# Patient Record
Sex: Female | Born: 1945
Health system: Southern US, Community
[De-identification: ages and names within clinical notes are randomized; demographics above are authoritative.]

## PROBLEM LIST (undated history)

## (undated) DIAGNOSIS — D071 Carcinoma in situ of vulva: Secondary | ICD-10-CM

## (undated) DIAGNOSIS — I4891 Unspecified atrial fibrillation: Secondary | ICD-10-CM

## (undated) DIAGNOSIS — E785 Hyperlipidemia, unspecified: Secondary | ICD-10-CM

## (undated) DIAGNOSIS — I2510 Atherosclerotic heart disease of native coronary artery without angina pectoris: Secondary | ICD-10-CM

## (undated) DIAGNOSIS — K5792 Diverticulitis of intestine, part unspecified, without perforation or abscess without bleeding: Secondary | ICD-10-CM

## (undated) DIAGNOSIS — E079 Disorder of thyroid, unspecified: Secondary | ICD-10-CM

## (undated) DIAGNOSIS — K589 Irritable bowel syndrome without diarrhea: Secondary | ICD-10-CM

## (undated) DIAGNOSIS — I341 Nonrheumatic mitral (valve) prolapse: Secondary | ICD-10-CM

## (undated) DIAGNOSIS — H269 Unspecified cataract: Secondary | ICD-10-CM

## (undated) HISTORY — DX: Unspecified atrial fibrillation: I48.91

## (undated) HISTORY — DX: Atherosclerotic heart disease of native coronary artery without angina pectoris: I25.10

## (undated) HISTORY — DX: Disorder of thyroid, unspecified: E07.9

## (undated) HISTORY — DX: Unspecified cataract: H26.9

## (undated) HISTORY — DX: Nonrheumatic mitral (valve) prolapse: I34.1

## (undated) HISTORY — PX: CATARACT EXTRACTION: SUR2

## (undated) HISTORY — DX: Diverticulitis of intestine, part unspecified, without perforation or abscess without bleeding: K57.92

## (undated) HISTORY — DX: Carcinoma in situ of vulva: D07.1

## (undated) HISTORY — DX: Irritable bowel syndrome, unspecified: K58.9

## (undated) HISTORY — DX: Hyperlipidemia, unspecified: E78.5

---

## 1976-10-22 HISTORY — PX: ABDOMINAL HYSTERECTOMY: SHX81

## 1980-10-22 HISTORY — PX: OTHER SURGICAL HISTORY: SHX169

## 1993-09-21 HISTORY — PX: OTHER SURGICAL HISTORY: SHX169

## 1998-02-28 ENCOUNTER — Other Ambulatory Visit: Admission: RE | Admit: 1998-02-28 | Discharge: 1998-02-28 | Payer: Self-pay | Admitting: Dermatology

## 1998-08-05 ENCOUNTER — Ambulatory Visit (HOSPITAL_COMMUNITY): Admission: RE | Admit: 1998-08-05 | Discharge: 1998-08-05 | Payer: Self-pay | Admitting: *Deleted

## 1999-08-07 ENCOUNTER — Ambulatory Visit (HOSPITAL_COMMUNITY): Admission: RE | Admit: 1999-08-07 | Discharge: 1999-08-07 | Payer: Self-pay | Admitting: *Deleted

## 1999-08-07 ENCOUNTER — Encounter: Payer: Self-pay | Admitting: *Deleted

## 2000-02-12 ENCOUNTER — Other Ambulatory Visit: Admission: RE | Admit: 2000-02-12 | Discharge: 2000-02-12 | Payer: Self-pay | Admitting: *Deleted

## 2000-08-09 ENCOUNTER — Ambulatory Visit (HOSPITAL_COMMUNITY): Admission: RE | Admit: 2000-08-09 | Discharge: 2000-08-09 | Payer: Self-pay | Admitting: *Deleted

## 2000-08-09 ENCOUNTER — Encounter: Payer: Self-pay | Admitting: *Deleted

## 2001-08-15 ENCOUNTER — Ambulatory Visit (HOSPITAL_COMMUNITY): Admission: RE | Admit: 2001-08-15 | Discharge: 2001-08-15 | Payer: Self-pay | Admitting: *Deleted

## 2001-08-15 ENCOUNTER — Encounter: Payer: Self-pay | Admitting: *Deleted

## 2002-08-17 ENCOUNTER — Encounter: Payer: Self-pay | Admitting: Family Medicine

## 2002-08-17 ENCOUNTER — Ambulatory Visit (HOSPITAL_COMMUNITY): Admission: RE | Admit: 2002-08-17 | Discharge: 2002-08-17 | Payer: Self-pay | Admitting: Family Medicine

## 2003-08-19 ENCOUNTER — Ambulatory Visit (HOSPITAL_COMMUNITY): Admission: RE | Admit: 2003-08-19 | Discharge: 2003-08-19 | Payer: Self-pay | Admitting: Family Medicine

## 2004-09-04 ENCOUNTER — Ambulatory Visit (HOSPITAL_COMMUNITY): Admission: RE | Admit: 2004-09-04 | Discharge: 2004-09-04 | Payer: Self-pay | Admitting: Family Medicine

## 2005-09-21 ENCOUNTER — Ambulatory Visit (HOSPITAL_COMMUNITY): Admission: RE | Admit: 2005-09-21 | Discharge: 2005-09-21 | Payer: Self-pay | Admitting: Family Medicine

## 2006-01-03 ENCOUNTER — Other Ambulatory Visit: Admission: RE | Admit: 2006-01-03 | Discharge: 2006-01-03 | Payer: Self-pay | Admitting: Gynecology

## 2006-04-30 ENCOUNTER — Other Ambulatory Visit: Admission: RE | Admit: 2006-04-30 | Discharge: 2006-04-30 | Payer: Self-pay | Admitting: Gynecology

## 2006-07-22 ENCOUNTER — Other Ambulatory Visit: Admission: RE | Admit: 2006-07-22 | Discharge: 2006-07-22 | Payer: Self-pay | Admitting: Gynecology

## 2006-10-01 ENCOUNTER — Ambulatory Visit (HOSPITAL_COMMUNITY): Admission: RE | Admit: 2006-10-01 | Discharge: 2006-10-01 | Payer: Self-pay | Admitting: Family Medicine

## 2006-12-21 HISTORY — PX: OTHER SURGICAL HISTORY: SHX169

## 2006-12-25 ENCOUNTER — Other Ambulatory Visit: Admission: RE | Admit: 2006-12-25 | Discharge: 2006-12-25 | Payer: Self-pay | Admitting: Gynecology

## 2007-01-07 ENCOUNTER — Ambulatory Visit (HOSPITAL_BASED_OUTPATIENT_CLINIC_OR_DEPARTMENT_OTHER): Admission: RE | Admit: 2007-01-07 | Discharge: 2007-01-07 | Payer: Self-pay | Admitting: Gynecology

## 2007-10-07 ENCOUNTER — Ambulatory Visit (HOSPITAL_COMMUNITY): Admission: RE | Admit: 2007-10-07 | Discharge: 2007-10-07 | Payer: Self-pay | Admitting: Family Medicine

## 2008-10-07 ENCOUNTER — Ambulatory Visit (HOSPITAL_COMMUNITY): Admission: RE | Admit: 2008-10-07 | Discharge: 2008-10-07 | Payer: Self-pay | Admitting: Family Medicine

## 2008-10-29 ENCOUNTER — Encounter: Admission: RE | Admit: 2008-10-29 | Discharge: 2008-10-29 | Payer: Self-pay | Admitting: Family Medicine

## 2009-03-03 ENCOUNTER — Ambulatory Visit (HOSPITAL_BASED_OUTPATIENT_CLINIC_OR_DEPARTMENT_OTHER): Admission: RE | Admit: 2009-03-03 | Discharge: 2009-03-03 | Payer: Self-pay | Admitting: Gynecology

## 2009-03-03 HISTORY — PX: OTHER SURGICAL HISTORY: SHX169

## 2009-11-21 ENCOUNTER — Encounter: Admission: RE | Admit: 2009-11-21 | Discharge: 2009-11-21 | Payer: Self-pay | Admitting: Gynecology

## 2010-09-18 ENCOUNTER — Other Ambulatory Visit
Admission: RE | Admit: 2010-09-18 | Discharge: 2010-09-18 | Payer: Self-pay | Source: Home / Self Care | Admitting: Family Medicine

## 2010-11-12 ENCOUNTER — Encounter: Payer: Self-pay | Admitting: Gynecology

## 2010-11-12 ENCOUNTER — Encounter: Payer: Self-pay | Admitting: Family Medicine

## 2010-11-27 ENCOUNTER — Other Ambulatory Visit (HOSPITAL_COMMUNITY): Payer: Self-pay | Admitting: Surgical Oncology

## 2010-11-27 ENCOUNTER — Other Ambulatory Visit: Payer: Self-pay | Admitting: Family Medicine

## 2010-11-27 DIAGNOSIS — Z1231 Encounter for screening mammogram for malignant neoplasm of breast: Secondary | ICD-10-CM

## 2010-12-01 ENCOUNTER — Ambulatory Visit: Payer: Self-pay

## 2010-12-07 ENCOUNTER — Ambulatory Visit
Admission: RE | Admit: 2010-12-07 | Discharge: 2010-12-07 | Disposition: A | Discharge status: Home / Self Care | Payer: BC Managed Care – PPO | Source: Ambulatory Visit | Attending: Family Medicine | Admitting: Family Medicine

## 2010-12-07 DIAGNOSIS — Z1231 Encounter for screening mammogram for malignant neoplasm of breast: Secondary | ICD-10-CM

## 2011-01-30 LAB — URINALYSIS, MICROSCOPIC ONLY
Glucose, UA: NEGATIVE mg/dL
Specific Gravity, Urine: 1.015 (ref 1.005–1.030)
Urobilinogen, UA: 0.2 mg/dL (ref 0.0–1.0)
pH: 5.5 (ref 5.0–8.0)

## 2011-01-30 LAB — POCT HEMOGLOBIN-HEMACUE: Hemoglobin: 12 g/dL (ref 12.0–15.0)

## 2011-01-30 LAB — HEMOGLOBIN AND HEMATOCRIT, BLOOD: Hemoglobin: 11.1 g/dL — ABNORMAL LOW (ref 12.0–15.0)

## 2011-03-06 NOTE — Op Note (Signed)
Susan Bray, Susan Bray                 ACCOUNT NO.:  [id number]   MEDICAL RECORD NO.:  [id number]          PATIENT TYPE:  AMB   LOCATION:  [facility]                         FACILITY:  [facility]   PHYSICIAN:  Gretta Cool, M.D. DATE OF BIRTH:  05/06/1946   DATE OF PROCEDURE:  03/03/2009  DATE OF DISCHARGE:                               OPERATIVE REPORT   PREOPERATIVE DIAGNOSES:  Pelvic organ prolapse with grade 3 rectocele,  enterocele and grade 2 vault prolapse.   SURGEON:  Gretta Cool, M.D.   ASSISTANT:  Luvenia Redden, M.D.   ANESTHESIA:  General orotracheal.   DESCRIPTION OF PROCEDURE:  With the patient prepped and draped in  lithotomy position in Allen stirrups with Foley catheter draining her  bladder, an incision was made in the posterior vaginal wall and the  mucosa undermined to the apex of the vagina.  Xylocaine 1% with  epinephrine was used to help control blood loss.  The mucosa was then  dissected from the perirectal fascia to the apex of the vaginal cuff.  The cardinal uterosacral ligament complex was then identified  posteriorly and removed of adventitial tissue and fat.  The ligament was  then secured with 2-0 Novofil to the perirectal fascia from which it had  detached.  The reattachment was accomplished without difficulty with  fascia to fascia attachment.  At this point, the central portion of the  ends of the uterosacral cardinal complex were plicated in the midline to  the posterior aspect of the cuff.  The central portion of the detached  fascia was then secured to that cuff and to that uterosacral complex.  At this point, the posterior vaginal fascia was plicated in the midline  from the apex to the introitus with a running suture of #0 Vicryl.  The  mucosa was then trimmed and upper layers of perirectal fascia and mucosa  closed as a subcuticular closure of 2-0 Vicryl.  At this point, the skin  was closed with subcuticular closure.  The procedure was  terminated  without complication.  The patient returned to the recovery room in  excellent condition.           ______________________________  Gretta Cool, M.D.     CWL/MEDQ  D:  03/03/2009  T:  03/03/2009  Job:  [id number]   cc:   Luvenia Redden, M.D.  Fax: [phone]   Windle Guard, M.D.  Fax: [phone]

## 2011-03-09 NOTE — Op Note (Signed)
NAMEGREGORIA, SELVY                 ACCOUNT NO.:  [id number]   MEDICAL RECORD NO.:  [id number]          PATIENT TYPE:  AMB   LOCATION:  [facility]                         FACILITY:  [facility]   PHYSICIAN:  Gretta Cool, M.D. DATE OF BIRTH:  [birth year]   DATE OF PROCEDURE:  DATE OF DISCHARGE:                               OPERATIVE REPORT   PREOPERATIVE DIAGNOSIS:  High grade vulvar and vaginal condyloma.  It  was persistent after 18 months of therapy.   POSTOPERATIVE DIAGNOSIS:  High grade vulvar and vaginal condyloma.  It  was persistent after 18 months of therapy.   PROCEDURE:  Laser vaporization of residual high grade vaginal dysplasia,  vulvar intraepithelial neoplasia (VIN) II.   SURGEON:  Gretta Cool, M.D.   ANESTHESIA:  General LMA.   DESCRIPTION OF PROCEDURE:  Under satisfactory anesthesia, as above, with  patient prepped and draped in Allen stirrups, the vagina was examined  with acetic acid then with Lugol, and all of the remaining nonstained  areas with Lugol were identified and vaporized by laser vaporization.  There were three or four individual areas in the left apex fund of the  vagina and one area approximately 1 cm in the right vaginal apex.  There  were no other lesions on the anterior or posterior vaginal walls all the  way to the introitus.  After complete laser vaporization of the lesions,  the procedure was terminated without complication.  Patient returned to  the recovery room in excellent condition.           ______________________________  Gretta Cool, M.D.     CWL/MEDQ  D:  01/07/2007  T:  01/07/2007  Job:  [id number]

## 2011-04-09 ENCOUNTER — Other Ambulatory Visit: Payer: Self-pay | Admitting: Gynecology

## 2011-11-22 ENCOUNTER — Other Ambulatory Visit: Payer: Self-pay | Admitting: Family Medicine

## 2011-11-22 DIAGNOSIS — Z1231 Encounter for screening mammogram for malignant neoplasm of breast: Secondary | ICD-10-CM

## 2011-12-13 ENCOUNTER — Ambulatory Visit
Admission: RE | Admit: 2011-12-13 | Discharge: 2011-12-13 | Disposition: A | Discharge status: Home / Self Care | Payer: BC Managed Care – PPO | Source: Ambulatory Visit | Attending: Family Medicine | Admitting: Family Medicine

## 2011-12-13 DIAGNOSIS — Z1231 Encounter for screening mammogram for malignant neoplasm of breast: Secondary | ICD-10-CM

## 2012-04-10 ENCOUNTER — Other Ambulatory Visit: Payer: Self-pay | Admitting: Gynecology

## 2012-12-03 ENCOUNTER — Other Ambulatory Visit: Payer: Self-pay | Admitting: Family Medicine

## 2012-12-03 DIAGNOSIS — Z1231 Encounter for screening mammogram for malignant neoplasm of breast: Secondary | ICD-10-CM

## 2012-12-15 ENCOUNTER — Other Ambulatory Visit: Payer: Self-pay | Admitting: Obstetrics and Gynecology

## 2012-12-15 ENCOUNTER — Other Ambulatory Visit (HOSPITAL_COMMUNITY)
Admission: RE | Admit: 2012-12-15 | Discharge: 2012-12-15 | Disposition: A | Discharge status: Home / Self Care | Payer: Medicare Other | Source: Ambulatory Visit | Attending: Obstetrics and Gynecology | Admitting: Obstetrics and Gynecology

## 2012-12-15 DIAGNOSIS — Z01419 Encounter for gynecological examination (general) (routine) without abnormal findings: Secondary | ICD-10-CM | POA: Insufficient documentation

## 2012-12-15 DIAGNOSIS — R87810 Cervical high risk human papillomavirus (HPV) DNA test positive: Secondary | ICD-10-CM | POA: Insufficient documentation

## 2012-12-15 DIAGNOSIS — Z1151 Encounter for screening for human papillomavirus (HPV): Secondary | ICD-10-CM | POA: Insufficient documentation

## 2012-12-31 ENCOUNTER — Ambulatory Visit
Admission: RE | Admit: 2012-12-31 | Discharge: 2012-12-31 | Disposition: A | Discharge status: Home / Self Care | Payer: Medicare Other | Source: Ambulatory Visit | Attending: Family Medicine | Admitting: Family Medicine

## 2013-02-09 ENCOUNTER — Other Ambulatory Visit: Payer: Self-pay | Admitting: Obstetrics and Gynecology

## 2013-03-18 HISTORY — PX: OTHER SURGICAL HISTORY: SHX169

## 2013-07-22 ENCOUNTER — Ambulatory Visit
Admission: RE | Admit: 2013-07-22 | Discharge: 2013-07-22 | Disposition: A | Discharge status: Home / Self Care | Payer: Medicare Other | Source: Ambulatory Visit | Attending: Family Medicine | Admitting: Family Medicine

## 2013-07-22 ENCOUNTER — Other Ambulatory Visit: Payer: Self-pay | Admitting: Family Medicine

## 2013-07-22 DIAGNOSIS — R1011 Right upper quadrant pain: Secondary | ICD-10-CM

## 2013-10-16 ENCOUNTER — Ambulatory Visit: Payer: Medicare Other | Admitting: Family Medicine

## 2013-11-23 ENCOUNTER — Other Ambulatory Visit (HOSPITAL_COMMUNITY)
Admission: RE | Admit: 2013-11-23 | Discharge: 2013-11-23 | Disposition: A | Discharge status: Home / Self Care | Payer: Medicare HMO | Source: Ambulatory Visit | Attending: Obstetrics and Gynecology | Admitting: Obstetrics and Gynecology

## 2013-11-23 ENCOUNTER — Other Ambulatory Visit: Payer: Self-pay | Admitting: Obstetrics and Gynecology

## 2013-11-23 DIAGNOSIS — Z1151 Encounter for screening for human papillomavirus (HPV): Secondary | ICD-10-CM | POA: Insufficient documentation

## 2013-11-23 DIAGNOSIS — R87810 Cervical high risk human papillomavirus (HPV) DNA test positive: Secondary | ICD-10-CM | POA: Insufficient documentation

## 2013-11-23 DIAGNOSIS — Z01419 Encounter for gynecological examination (general) (routine) without abnormal findings: Secondary | ICD-10-CM | POA: Insufficient documentation

## 2013-11-23 DIAGNOSIS — Z124 Encounter for screening for malignant neoplasm of cervix: Secondary | ICD-10-CM | POA: Insufficient documentation

## 2013-12-02 MED ORDER — MORPHINE SULFATE (PF) 1 MG/ML IV SOLN
INTRAVENOUS | Status: AC
  Filled 2013-12-02: qty 25

## 2013-12-29 ENCOUNTER — Other Ambulatory Visit: Payer: Self-pay

## 2013-12-29 DIAGNOSIS — Z1231 Encounter for screening mammogram for malignant neoplasm of breast: Secondary | ICD-10-CM

## 2014-01-01 ENCOUNTER — Other Ambulatory Visit (INDEPENDENT_AMBULATORY_CARE_PROVIDER_SITE_OTHER): Payer: Medicare HMO

## 2014-01-01 ENCOUNTER — Ambulatory Visit (INDEPENDENT_AMBULATORY_CARE_PROVIDER_SITE_OTHER): Payer: Medicare HMO | Admitting: Physician Assistant

## 2014-01-01 ENCOUNTER — Encounter: Payer: Self-pay | Admitting: Physician Assistant

## 2014-01-01 VITALS — BP 100/60 | HR 69 | Temp 97.7°F | Ht 64.0 in | Wt 134.2 lb

## 2014-01-01 DIAGNOSIS — R42 Dizziness and giddiness: Secondary | ICD-10-CM

## 2014-01-01 DIAGNOSIS — J029 Acute pharyngitis, unspecified: Secondary | ICD-10-CM

## 2014-01-01 DIAGNOSIS — E785 Hyperlipidemia, unspecified: Secondary | ICD-10-CM

## 2014-01-01 LAB — CBC WITH DIFFERENTIAL/PLATELET
Basophils Absolute: 0 K/uL (ref 0.0–0.1)
Basophils Relative: 0.6 % (ref 0.0–3.0)
Eosinophils Absolute: 0.3 K/uL (ref 0.0–0.7)
Eosinophils Relative: 4.9 % (ref 0.0–5.0)
HCT: 36.3 % (ref 36.0–46.0)
Hemoglobin: 12 g/dL (ref 12.0–15.0)
Lymphocytes Relative: 32.2 % (ref 12.0–46.0)
Lymphs Abs: 1.8 K/uL (ref 0.7–4.0)
MCHC: 32.9 g/dL (ref 30.0–36.0)
MCV: 86.1 fl (ref 78.0–100.0)
Monocytes Absolute: 0.4 K/uL (ref 0.1–1.0)
Monocytes Relative: 7.9 % (ref 3.0–12.0)
Neutro Abs: 3.1 K/uL (ref 1.4–7.7)
Neutrophils Relative %: 54.4 % (ref 43.0–77.0)
Platelets: 186 K/uL (ref 150.0–400.0)
RBC: 4.22 Mil/uL (ref 3.87–5.11)
RDW: 13.9 % (ref 11.5–14.6)
WBC: 5.7 K/uL (ref 4.5–10.5)

## 2014-01-01 LAB — MONONUCLEOSIS SCREEN: MONO SCREEN: NEGATIVE

## 2014-01-01 MED ORDER — AMOXICILLIN 875 MG PO TABS: 875.0000 mg | ORAL_TABLET | Freq: Two times a day (BID) | ORAL | Status: DC

## 2014-01-01 NOTE — Patient Instructions (Signed)
It was great to meet you today [name]!  I have ordered a few labs today, please report to the lab in the basement.     Vertigo Vertigo means you feel like you are moving when you are not. Vertigo can make you feel like things around you are moving when they are not. This problem often goes away on its own.  HOME CARE   Follow your doctor's instructions.  Avoid driving.  Avoid using heavy machinery.  Avoid doing any activity that could be dangerous if you have a vertigo attack.  Tell your doctor if a medicine seems to cause your vertigo. GET HELP RIGHT AWAY IF:   Your medicines do not help or make you feel worse.  You have trouble talking or walking.  You feel weak or have trouble using your arms, hands, or legs.  You have bad headaches.  You keep feeling sick to your stomach (nauseous) or throwing up (vomiting).  Your vision changes.  A family member notices changes in your behavior.  Your problems get worse. MAKE SURE YOU:  Understand these instructions.  Will watch your condition.  Will get help right away if you are not doing well or get worse. Document Released: 07/17/2008 Document Revised: 12/31/2011 Document Reviewed: 04/26/2011 [facility] Patient Information 2014 [employer]. Strep Throat Strep throat is an infection of the throat caused by a bacteria named Streptococcus pyogenes. Your caregiver may call the infection streptococcal "tonsillitis" or "pharyngitis" depending on whether there are signs of inflammation in the tonsils or back of the throat. Strep throat is most common in children aged 5 15 years during the cold months of the year, but it can occur in people of any age during any season. This infection is spread from person to person (contagious) through coughing, sneezing, or other close contact. SYMPTOMS   Fever or chills.  Painful, swollen, red tonsils or throat.  Pain or difficulty when swallowing.  White or yellow spots on the tonsils  or throat.  Swollen, tender lymph nodes or "glands" of the neck or under the jaw.  Red rash all over the body (rare). DIAGNOSIS  Many different infections can cause the same symptoms. A test must be done to confirm the diagnosis so the right treatment can be given. A "rapid strep test" can help your caregiver make the diagnosis in a few minutes. If this test is not available, a light swab of the infected area can be used for a throat culture test. If a throat culture test is done, results are usually available in a day or two. TREATMENT  Strep throat is treated with antibiotic medicine. HOME CARE INSTRUCTIONS   Gargle with 1 tsp of salt in 1 cup of warm water, 3 4 times per day or as needed for comfort.  Family members who also have a sore throat or fever should be tested for strep throat and treated with antibiotics if they have the strep infection.  Make sure everyone in your household washes their hands well.  Do not share food, drinking cups, or personal items that could cause the infection to spread to others.  You may need to eat a soft food diet until your sore throat gets better.  Drink enough water and fluids to keep your urine clear or pale yellow. This will help prevent dehydration.  Get plenty of rest.  Stay home from school, daycare, or work until you have been on antibiotics for 24 hours.  Only take over-the-counter or prescription medicines  for pain, discomfort, or fever as directed by your caregiver.  If antibiotics are prescribed, take them as directed. Finish them even if you start to feel better. SEEK MEDICAL CARE IF:   The glands in your neck continue to enlarge.  You develop a rash, cough, or earache.  You cough up green, yellow-brown, or bloody sputum.  You have pain or discomfort not controlled by medicines.  Your problems seem to be getting worse rather than better. SEEK IMMEDIATE MEDICAL CARE IF:   You develop any new symptoms such as vomiting,  severe headache, stiff or painful neck, chest pain, shortness of breath, or trouble swallowing.  You develop severe throat pain, drooling, or changes in your voice.  You develop swelling of the neck, or the skin on the neck becomes red and tender.  You have a fever.  You develop signs of dehydration, such as fatigue, dry mouth, and decreased urination.  You become increasingly sleepy, or you cannot wake up completely. Document Released: 10/05/2000 Document Revised: 09/24/2012 Document Reviewed: 12/07/2010 [facility] Patient Information 2014 [place], [place].

## 2014-01-01 NOTE — Progress Notes (Signed)
Pre visit review using our clinic review tool, if applicable. No additional management support is needed unless otherwise documented below in the visit note. 

## 2014-01-02 ENCOUNTER — Encounter: Payer: Self-pay | Admitting: Physician Assistant

## 2014-01-02 NOTE — Progress Notes (Signed)
Subjective:    Patient ID: Susan Bray, female    DOB: 09-12-1946, 68 y.o.   MRN: [id number]  HPI Comments: Patient is a 68 year old female who presents to the office to establish care. Reports she has had regular primary care with a different provider in the area. Had complete physical exam and lab work with provider in January, then to find out they are not part of her insurance. Will obtain past records from them to include lab results, immunizations and diagnostics.  Current complaint: Sore throat starting 2-3 days PTA, Painful swallowing, White spots on back of throat that are painful. No known fever   History of intermittent vertigo treated with meclizine as needed. History of hyperlipidemia treated with Crestor  Is established with GYN, mammogram scheduled for later in March. Eye Dr: in last 68months   Review of Systems  Constitutional: Negative for chills, activity change, appetite change and fatigue.  HENT: Positive for sore throat. Negative for congestion, ear pain, postnasal drip, rhinorrhea, sinus pressure, trouble swallowing (painful to swallow) and voice change.   Eyes: Negative for pain and visual disturbance.  Respiratory: Negative for cough, shortness of breath and wheezing.   Cardiovascular: Negative for chest pain and palpitations.  Gastrointestinal: Negative for nausea, vomiting, diarrhea and constipation.  Musculoskeletal: Negative for myalgias.  Skin: Negative for rash.  Neurological: Negative for dizziness, weakness, light-headedness and headaches.   Past Medical History  Diagnosis Date  . Hyperlipidemia    Family History  Problem Relation Age of Onset  . Arthritis Mother   . Cancer Mother     breast  . Heart disease Mother   . Hypertension Mother   . Heart disease Father    History   Social History  . Marital Status: Married    Spouse Name: N/A    Number of Children: N/A  . Years of Education: N/A   Social History Main Topics  . Smoking  status: Never Smoker   . Smokeless tobacco: None  . Alcohol Use: No  . Drug Use: No  . Sexual Activity: None   Other Topics Concern  . None   Social History Narrative   Married   Retired   3 pregnancies   3 live births   8-9 hours of sleep a night    2 people in residence    Past Surgical History  Procedure Laterality Date  . Abdominal hysterectomy  1978  . Tummy tuck  1982  . Upper colpectomy  09/1993  . Cataract extraction  08/17/04 ; 09/12/2004    left eye and right eye  . Laser vaporization of hg vaginal dysplasia  12/2006  . P & e repairs with cardinal uterosatral coloposusension (pelvic area repair)  03/03/09  . Severe dysplasia near rectum  03/18/2013   No Known Allergies No current outpatient prescriptions on file prior to visit.   No current facility-administered medications on file prior to visit.        Objective:   Physical Exam  Vitals reviewed. Constitutional: She appears well-developed and well-nourished. No distress.  HENT:  Head: Normocephalic and atraumatic.  Right Ear: Tympanic membrane, external ear and ear canal normal.  Left Ear: Tympanic membrane, external ear and ear canal normal.  Nose: No mucosal edema or rhinorrhea. Right sinus exhibits no maxillary sinus tenderness and no frontal sinus tenderness. Left sinus exhibits no maxillary sinus tenderness and no frontal sinus tenderness.  Mouth/Throat: Uvula is midline and mucous membranes are normal. No trismus in the  jaw. No uvula swelling. Oropharyngeal exudate, posterior oropharyngeal edema (minimal) and posterior oropharyngeal erythema (mild) present. No tonsillar abscesses.  Clear buccal mucosa, floor of mouth is soft, no submental or submandibular lymphadenopathy  Cardiovascular: Normal rate and regular rhythm.  Exam reveals no gallop and no friction rub.   No murmur heard. Pulmonary/Chest: Effort normal and breath sounds normal. No accessory muscle usage. No respiratory distress. She has no  wheezes. She has no rhonchi. She has no rales.  Abdominal: Soft. Normal appearance and bowel sounds are normal. There is no tenderness.  Lymphadenopathy:       Right: No supraclavicular adenopathy present.       Left: No supraclavicular adenopathy present.  Mild anterior cervical lymphadenopathy mildly TTP.  Skin: Skin is warm and dry.  Psychiatric: She has a normal mood and affect.   Filed Vitals:   01/01/14 1043  BP: 100/60  Pulse: 69  Temp: 97.7 F (36.5 C)      Assessment & Plan:   Exudative pharyngitis Rx for antibiotics Labs ordered for CBC and monospot RTO if no improvement of symptoms or symptoms become worse  Hyperlipidemia Continue on current medications.  RTO in 6 months for evaluation

## 2014-01-05 ENCOUNTER — Other Ambulatory Visit: Payer: Medicare HMO

## 2014-01-11 ENCOUNTER — Other Ambulatory Visit: Payer: Self-pay | Admitting: Physician Assistant

## 2014-01-11 ENCOUNTER — Encounter: Payer: Self-pay | Admitting: Physician Assistant

## 2014-01-11 MED ORDER — FLUCONAZOLE 150 MG PO TABS: 150.0000 mg | ORAL_TABLET | Freq: Once | ORAL | Status: DC

## 2014-01-20 ENCOUNTER — Encounter: Payer: Self-pay | Admitting: Physician Assistant

## 2014-01-20 ENCOUNTER — Ambulatory Visit
Admission: RE | Admit: 2014-01-20 | Discharge: 2014-01-20 | Disposition: A | Discharge status: Home / Self Care | Payer: Medicare HMO | Source: Ambulatory Visit

## 2014-01-20 DIAGNOSIS — Z1231 Encounter for screening mammogram for malignant neoplasm of breast: Secondary | ICD-10-CM

## 2014-01-20 DIAGNOSIS — J329 Chronic sinusitis, unspecified: Secondary | ICD-10-CM

## 2014-01-25 NOTE — Addendum Note (Signed)
Addended by: Gwendolyn Grant A on: 01/25/2014 06:52 PM   Modules accepted: Orders

## 2014-02-05 ENCOUNTER — Encounter: Payer: Self-pay | Admitting: Physician Assistant

## 2014-05-31 ENCOUNTER — Encounter: Payer: Self-pay | Admitting: Internal Medicine

## 2014-05-31 ENCOUNTER — Ambulatory Visit (INDEPENDENT_AMBULATORY_CARE_PROVIDER_SITE_OTHER): Payer: Medicare HMO | Admitting: Internal Medicine

## 2014-05-31 VITALS — BP 109/66 | HR 65 | Temp 98.2°F | Wt 127.2 lb

## 2014-05-31 DIAGNOSIS — J029 Acute pharyngitis, unspecified: Secondary | ICD-10-CM

## 2014-05-31 NOTE — Progress Notes (Signed)
   Subjective:    Patient ID: Susan Bray, female    DOB: 1946/09/11, 68 y.o.   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  On 8/6 she developed  scratchy, sore throat and rhinitis. She also had some chills  As of 8/8 she had noted some blisters in the back of her pharynx.  She has some frontal sinus pressure discomfort and earache.    Review of Systems Facial pain , nasal purulence, dental pain,  or otic discharge denied. No fever or sweats.  She has no significant extrinsic symptoms.  Cough is nonproductive.     Objective:   Physical Exam  Positive for significant findings include: Mild erythema of the posterior pharynx with a single small vesicle. No exudate present. Thyroid asymmetry, left lobe greater than right.  General appearance:good health ;well nourished; no acute distress or increased work of breathing is present.  No  lymphadenopathy about the head, neck, or axilla noted.   Eyes: No conjunctival inflammation or lid edema is present. There is no scleral icterus.  Ears:  External ear exam shows no significant lesions or deformities.  Otoscopic examination reveals clear canals, tympanic membranes are intact bilaterally without bulging, retraction, inflammation or discharge.  Nose:  External nasal examination shows no deformity or inflammation. Nasal mucosa are pink and moist without lesions or exudates. No septal dislocation or deviation.No obstruction to airflow.   Oral exam: Dental hygiene is good; lips and gums are healthy appearing.  Neck:  No deformities, masses, or tenderness noted.   Supple with full range of motion without pain.   Heart:  Normal rate and regular rhythm. S1 and S2 normal without gallop, murmur, click, rub or other extra sounds.   Lungs:Chest clear to auscultation; no wheezes, rhonchi,rales ,or rubs present.No increased work of breathing.    Extremities:  No cyanosis,  edema, or clubbing  noted    Skin: Warm & dry w/o jaundice or tenting.         Assessment & Plan:  #1 viral pharyngitis  See orders and recommendations

## 2014-05-31 NOTE — Progress Notes (Signed)
Pre visit review using our clinic review tool, if applicable. No additional management support is needed unless otherwise documented below in the visit note. 

## 2014-05-31 NOTE — Patient Instructions (Addendum)
Plain Mucinex (NOT D) for thick secretions ;force NON dairy fluids .   Nasal cleansing in the shower as discussed with lather of mild shampoo.After 10 seconds wash off lather while  exhaling through nostrils. Make sure that all residual soap is removed to prevent irritation.  Flonase OR Nasacort AQ 1 spray in each nostril twice a day as needed. Use the "crossover" technique into opposite nostril spraying toward opposite ear @ 45 degree angle, not straight up into nostril.  Use a Neti pot daily only  as needed for significant sinus congestion; going from open side to congested side . Plain Allegra (NOT D )  160 daily , Loratidine 10 mg , OR Zyrtec 10 mg @ bedtime  as needed for itchy eyes & sneezing.  Zicam Melts or Zinc lozenges as per package label for scratchy throat . Complementary options include  vitamin C 2000 mg daily; & Echinacea for 4-7 days. Report persistent or progressive fever; discolored nasal or chest secretions; or frontal headache or facial  pain.

## 2014-07-29 ENCOUNTER — Other Ambulatory Visit (INDEPENDENT_AMBULATORY_CARE_PROVIDER_SITE_OTHER): Payer: Medicare HMO

## 2014-07-29 ENCOUNTER — Ambulatory Visit (INDEPENDENT_AMBULATORY_CARE_PROVIDER_SITE_OTHER): Payer: Medicare HMO | Admitting: Internal Medicine

## 2014-07-29 ENCOUNTER — Encounter: Payer: Self-pay | Admitting: Internal Medicine

## 2014-07-29 VITALS — BP 118/70 | HR 70 | Temp 98.2°F | Resp 20 | Ht 64.0 in | Wt 124.8 lb

## 2014-07-29 DIAGNOSIS — R5383 Other fatigue: Secondary | ICD-10-CM

## 2014-07-29 DIAGNOSIS — J029 Acute pharyngitis, unspecified: Secondary | ICD-10-CM

## 2014-07-29 LAB — CBC
HEMATOCRIT: 38.7 % (ref 36.0–46.0)
Hemoglobin: 12.8 g/dL (ref 12.0–15.0)
MCHC: 33.1 g/dL (ref 30.0–36.0)
MCV: 85.3 fl (ref 78.0–100.0)
Platelets: 200 10*3/uL (ref 150.0–400.0)
RBC: 4.53 Mil/uL (ref 3.87–5.11)
RDW: 13.5 % (ref 11.5–15.5)
WBC: 6 10*3/uL (ref 4.0–10.5)

## 2014-07-29 MED ORDER — AMOXICILLIN 875 MG PO TABS: 875.0000 mg | ORAL_TABLET | Freq: Two times a day (BID) | ORAL | Status: DC

## 2014-07-29 NOTE — Patient Instructions (Signed)
We will treat you with amoxicillin for the sore throat. Take 1 pill twice a day until it is gone.   We will check your blood tests and call you with the results.

## 2014-07-29 NOTE — Progress Notes (Signed)
Pre visit review using our clinic review tool, if applicable. No additional management support is needed unless otherwise documented below in the visit note. 

## 2014-07-30 DIAGNOSIS — J029 Acute pharyngitis, unspecified: Secondary | ICD-10-CM | POA: Insufficient documentation

## 2014-07-30 LAB — HIV ANTIBODY (ROUTINE TESTING W REFLEX): HIV: NONREACTIVE

## 2014-07-30 NOTE — Progress Notes (Signed)
   Subjective:    Patient ID: Susan Bray, female    DOB: 05-22-1946, 68 y.o.   MRN: [id number]  HPI The patient is a 68 show female with no significant past medical history and no chronic medications. She does come in today for sore throat and white spots on the top of her mouth. She noticed a sore throat about a week and half ago. She denies drainage, congestion, fevers, chills. She initially started gargling with warm salt water to help as this remedy was given to her earlier in the year for similar situation. She noticed it was not going away and was getting worse so she decided to come in to be seen. Denies other skin change or rash.  Review of Systems  Constitutional: Negative for fever, chills, activity change, appetite change and fatigue.  HENT: Positive for sore throat. Negative for congestion, ear discharge, nosebleeds, postnasal drip, rhinorrhea, sinus pressure and sneezing.   Respiratory: Negative for cough, chest tightness, shortness of breath and wheezing.   Cardiovascular: Negative for chest pain, palpitations and leg swelling.  Gastrointestinal: Negative for abdominal pain, diarrhea, constipation and abdominal distention.      Objective:   Physical Exam  Constitutional: She appears well-developed and well-nourished. No distress.  HENT:  Head: Normocephalic and atraumatic.  Right Ear: External ear normal.  Left Ear: External ear normal.  Mouth with several white spots on the roof of the mouth which are removable with tongue depressor. Tongue without plaque. Some swelling of the tonsils at the back of the throat.  Eyes: EOM are normal.  Neck: Normal range of motion. No JVD present.  Cardiovascular: Normal rate and regular rhythm.   Pulmonary/Chest: Effort normal and breath sounds normal. No respiratory distress. She has no wheezes. She has no rales.  Abdominal: Soft.  Lymphadenopathy:    She has no cervical adenopathy.  Neurological: She is alert.  Skin: Skin is warm and  dry.   Filed Vitals:   07/29/14 1338  BP: 118/70  Pulse: 70  Temp: 98.2 F (36.8 C)  TempSrc: Oral  Resp: 20  Height: 5\' 4"  (1.626 m)  Weight: 124 lb 12.8 oz (56.609 kg)  SpO2: 97%      Assessment & Plan:

## 2014-07-30 NOTE — Assessment & Plan Note (Signed)
Several white spots on the roof of the mouth. Some Prelone to the back of the throat and will treat for strep throat with amoxicillin 500 mg 3 times a day for 10 days. We'll also check CBC, HIV test. Previously she has been tested for mono which was negative.

## 2014-08-25 ENCOUNTER — Ambulatory Visit: Payer: Medicare HMO | Admitting: Internal Medicine

## 2014-09-02 ENCOUNTER — Encounter: Payer: Self-pay | Admitting: Internal Medicine

## 2014-09-08 ENCOUNTER — Telehealth: Payer: Self-pay | Admitting: Family

## 2014-09-08 ENCOUNTER — Encounter: Payer: Self-pay | Admitting: Family

## 2014-09-08 ENCOUNTER — Ambulatory Visit (INDEPENDENT_AMBULATORY_CARE_PROVIDER_SITE_OTHER): Payer: Medicare HMO | Admitting: Family

## 2014-09-08 ENCOUNTER — Ambulatory Visit (INDEPENDENT_AMBULATORY_CARE_PROVIDER_SITE_OTHER): Payer: Medicare HMO

## 2014-09-08 ENCOUNTER — Other Ambulatory Visit (INDEPENDENT_AMBULATORY_CARE_PROVIDER_SITE_OTHER): Payer: Medicare HMO

## 2014-09-08 VITALS — BP 116/68 | HR 67 | Temp 98.0°F | Resp 18 | Ht 64.0 in | Wt 124.4 lb

## 2014-09-08 DIAGNOSIS — E785 Hyperlipidemia, unspecified: Secondary | ICD-10-CM

## 2014-09-08 DIAGNOSIS — H938X3 Other specified disorders of ear, bilateral: Secondary | ICD-10-CM

## 2014-09-08 DIAGNOSIS — H938X9 Other specified disorders of ear, unspecified ear: Secondary | ICD-10-CM | POA: Insufficient documentation

## 2014-09-08 DIAGNOSIS — Z23 Encounter for immunization: Secondary | ICD-10-CM

## 2014-09-08 LAB — HEPATIC FUNCTION PANEL
ALK PHOS: 51 U/L (ref 39–117)
ALT: 17 U/L (ref 0–35)
AST: 21 U/L (ref 0–37)
Albumin: 4.1 g/dL (ref 3.5–5.2)
BILIRUBIN DIRECT: 0.1 mg/dL (ref 0.0–0.3)
BILIRUBIN TOTAL: 0.8 mg/dL (ref 0.2–1.2)
Total Protein: 7.5 g/dL (ref 6.0–8.3)

## 2014-09-08 LAB — LIPID PANEL
CHOLESTEROL: 180 mg/dL (ref 0–200)
HDL: 46.2 mg/dL (ref >39.00)
LDL Cholesterol: 113 mg/dL — ABNORMAL HIGH (ref 0–99)
NONHDL: 133.8
Total CHOL/HDL Ratio: 4
Triglycerides: 106 mg/dL (ref 0.0–149.0)
VLDL: 21.2 mg/dL (ref 0.0–40.0)

## 2014-09-08 NOTE — Telephone Encounter (Signed)
Please call the patient and inform her that her cholesterol levels look good and do not indicate she needs medication at this time. Let's hold off on the pravastatin and recheck her cholesterol in 6 months to determine the need.

## 2014-09-08 NOTE — Patient Instructions (Signed)
Thank you for choosing [facility].  Summary/Instructions:  Your prescription(s) have been submitted to your pharmacy. Please take as directed and contact our office if you believe you are having problem(s) with the medication(s).  Please stop by the lab on the basement level of the building for your blood work. Your results will be released to [place] (or called to you) after review, usually within 72hours after test completion. If any changes need to be made, you will be notified at that same time.  High Cholesterol High cholesterol refers to having a high level of cholesterol in your blood. Cholesterol is a white, waxy, fat-like protein that your body needs in small amounts. Your liver makes all the cholesterol you need. Excess cholesterol comes from the food you eat. Cholesterol travels in your bloodstream through your blood vessels. If you have high cholesterol, deposits (plaque) may build up on the walls of your blood vessels. This makes the arteries narrower and stiffer. Plaque increases your risk of heart attack and stroke. Work with your health care provider to keep your cholesterol levels in a healthy range. RISK FACTORS Several things can make you more likely to have high cholesterol. These include:   Eating foods high in animal fat (saturated fat) or cholesterol.  Being overweight.  Not getting enough exercise.  Having a family history of high cholesterol. SIGNS AND SYMPTOMS High cholesterol does not cause symptoms. DIAGNOSIS  Your health care provider can do a blood test to check whether you have high cholesterol. If you are older than 20, your health care provider may check your cholesterol every 4-6 years. You may be checked more often if you already have high cholesterol or other risk factors for heart disease. The blood test for cholesterol measures the following:  Bad cholesterol (LDL cholesterol). This is the type of cholesterol that causes heart disease. This  number should be less than 100.  Good cholesterol (HDL cholesterol). This type helps protect against heart disease. A healthy level of HDL cholesterol is 60 or higher.  Total cholesterol. This is the combined number of LDL cholesterol and HDL cholesterol. A healthy number is less than 200. TREATMENT  High cholesterol can be treated with diet changes, lifestyle changes, and medicine.   Diet changes may include eating more whole grains, fruits, vegetables, nuts, and fish. You may also have to cut back on red meat and foods with a lot of added sugar.  Lifestyle changes may include getting at least 40 minutes of aerobic exercise three times a week. Aerobic exercises include walking, biking, and swimming. Aerobic exercise along with a healthy diet can help you maintain a healthy weight. Lifestyle changes may also include quitting smoking.  If diet and lifestyle changes are not enough to lower your cholesterol, your health care provider may prescribe a statin medicine. This medicine has been shown to lower cholesterol and also lower the risk of heart disease. HOME CARE INSTRUCTIONS  Only take over-the-counter or prescription medicines as directed by your health care provider.   Follow a healthy diet as directed by your health care provider. For instance:   Eat chicken (without skin), fish, veal, shellfish, ground [place] breast, and round or loin cuts of red meat.  Do not eat fried foods and fatty meats, such as hot dogs and salami.   Eat plenty of fruits, such as apples.   Eat plenty of vegetables, such as broccoli, potatoes, and carrots.   Eat beans, peas, and lentils.   Eat grains, such as  barley, rice, couscous, and bulgur wheat.   Eat pasta without cream sauces.   Use skim or nonfat milk and low-fat or nonfat yogurt and cheeses. Do not eat or drink whole milk, cream, ice cream, egg yolks, and hard cheeses.   Do not eat stick margarine or tub margarines that contain trans  fats (also called partially hydrogenated oils).   Do not eat cakes, cookies, crackers, or other baked goods that contain trans fats.   Do not eat saturated tropical oils, such as coconut and palm oil.   Exercise as directed by your health care provider. Increase your activity level with activities such as gardening or walking.   Keep all follow-up appointments.  SEEK MEDICAL CARE IF:  You are struggling to maintain a healthy diet or weight.  You need help starting an exercise program.  You need help to stop smoking. SEEK IMMEDIATE MEDICAL CARE IF:  You have chest pain.  You have trouble breathing. Document Released: 10/08/2005 Document Revised: 02/22/2014 Document Reviewed: 07/31/2013 [facility] Patient Information 2015 [place], [place]. This information is not intended to replace advice given to you by your health care provider. Make sure you discuss any questions you have with your health care provider.

## 2014-09-08 NOTE — Assessment & Plan Note (Addendum)
No impacted cerumen noted - normal ear exam. Start flonase or antihistamines as needed.

## 2014-09-08 NOTE — Telephone Encounter (Signed)
Called pt and no answer. Left message for her to call me back.

## 2014-09-08 NOTE — Progress Notes (Signed)
Pre visit review using our clinic review tool, if applicable. No additional management support is needed unless otherwise documented below in the visit note. 

## 2014-09-08 NOTE — Progress Notes (Signed)
   Subjective:    Patient ID: Susan Bray, female    DOB: 1946-06-13, 68 y.o.   MRN: [id number]  Chief Complaint  Patient presents with  . cholesterol check    wanted cholesterol check and flu shot    HPI:  Susan Bray is a 68 y.o. female who presents today for a follow up on her cholesterol.  1)Currently maintained on pravastatin, ran out last Friday.  Has been taking it for the last 3 months and previously may have experienced muscle soreness, but none now. Was previously followed by another physician and told her total cholesterol was 273, unable to recall LDL level.  2) Would like a flu shot.   3) Would like her ears checked to ensure she does not have any impacted cerumen  No Known Allergies  Current Outpatient Prescriptions on File Prior to Visit  Medication Sig Dispense Refill  . sodium chloride (OCEAN) 0.65 % SOLN nasal spray Place 1 spray into both nostrils as needed for congestion.    . diclofenac (VOLTAREN) 75 MG EC tablet Take 75 mg by mouth 2 (two) times daily.    . DiphenhydrAMINE HCl (BENADRYL ALLERGY PO) Take by mouth as needed.     No current facility-administered medications on file prior to visit.    Review of Systems    See HPI   Objective:    BP 116/68 mmHg  Pulse 67  Temp(Src) 98 F (36.7 C) (Oral)  Resp 18  Ht 5\' 4"  (1.626 m)  Wt 124 lb 6.4 oz (56.427 kg)  BMI 21.34 kg/m2  SpO2 97% Nursing note and vital signs reviewed.  Physical Exam  Constitutional: She is oriented to person, place, and time. She appears well-developed and well-nourished. No distress.  HENT:  Right Ear: Hearing, tympanic membrane, external ear and ear canal normal.  Left Ear: Hearing, tympanic membrane, external ear and ear canal normal.  No impacted cerumen present bilaterally.  Cardiovascular: Normal rate, regular rhythm, normal heart sounds and intact distal pulses.   Pulmonary/Chest: Effort normal and breath sounds normal.  Neurological: She is alert and oriented to  person, place, and time.  Skin: Skin is warm and dry.  Psychiatric: She has a normal mood and affect. Her behavior is normal. Judgment and thought content normal.       Assessment & Plan:

## 2014-09-08 NOTE — Assessment & Plan Note (Signed)
Maintained on pravastatin. Check lipid profile and hepatic panel. Continue pravastatin at current dose pending lab results.

## 2014-09-09 NOTE — Telephone Encounter (Signed)
Called pt and let her know that her cholesterol levels were good and no need for the pravastatin at this time. She is aware to follow up in 6 months to recheck.

## 2014-09-20 ENCOUNTER — Ambulatory Visit: Payer: Medicare HMO | Admitting: Family

## 2014-11-02 ENCOUNTER — Other Ambulatory Visit (HOSPITAL_COMMUNITY)
Admission: RE | Admit: 2014-11-02 | Discharge: 2014-11-02 | Disposition: A | Discharge status: Home / Self Care | Payer: PPO | Source: Ambulatory Visit | Attending: Obstetrics and Gynecology | Admitting: Obstetrics and Gynecology

## 2014-11-02 DIAGNOSIS — Z124 Encounter for screening for malignant neoplasm of cervix: Secondary | ICD-10-CM | POA: Diagnosis present

## 2014-11-02 DIAGNOSIS — Z1151 Encounter for screening for human papillomavirus (HPV): Secondary | ICD-10-CM | POA: Diagnosis present

## 2015-04-18 ENCOUNTER — Other Ambulatory Visit: Payer: Self-pay

## 2015-08-26 ENCOUNTER — Other Ambulatory Visit: Payer: Self-pay | Admitting: Ophthalmology

## 2015-08-26 DIAGNOSIS — H532 Diplopia: Secondary | ICD-10-CM

## 2015-09-06 ENCOUNTER — Ambulatory Visit (HOSPITAL_COMMUNITY): Admission: RE | Admit: 2015-09-06 | Payer: PPO | Source: Ambulatory Visit

## 2015-09-12 ENCOUNTER — Ambulatory Visit (HOSPITAL_COMMUNITY)
Admission: RE | Admit: 2015-09-12 | Discharge: 2015-09-12 | Disposition: A | Discharge status: Home / Self Care | Payer: PPO | Source: Ambulatory Visit | Attending: Ophthalmology | Admitting: Ophthalmology

## 2015-09-12 DIAGNOSIS — E785 Hyperlipidemia, unspecified: Secondary | ICD-10-CM | POA: Insufficient documentation

## 2015-09-12 DIAGNOSIS — I739 Peripheral vascular disease, unspecified: Secondary | ICD-10-CM | POA: Diagnosis not present

## 2015-09-12 DIAGNOSIS — H532 Diplopia: Secondary | ICD-10-CM | POA: Insufficient documentation

## 2015-09-12 LAB — CREATININE, SERUM
Creatinine, Ser: 1.05 mg/dL — ABNORMAL HIGH (ref 0.44–1.00)
GFR, EST NON AFRICAN AMERICAN: 54 mL/min — AB (ref >60)

## 2015-09-12 MED ORDER — GADOBENATE DIMEGLUMINE 529 MG/ML IV SOLN
10.0000 mL | Freq: Once | INTRAVENOUS | Status: AC | PRN
  Administered 2015-09-12: 10 mL via INTRAVENOUS

## 2015-09-22 ENCOUNTER — Telehealth: Payer: Self-pay | Admitting: Cardiology

## 2015-09-22 NOTE — Telephone Encounter (Signed)
Received records from [place] for appointment on 10/06/15 with Dr Percival Spanish.  Records given to [facility] (medical records) for Dr Hochrein's schedule on 10/06/15. lp

## 2015-09-29 ENCOUNTER — Ambulatory Visit: Payer: PPO

## 2015-09-29 ENCOUNTER — Ambulatory Visit (INDEPENDENT_AMBULATORY_CARE_PROVIDER_SITE_OTHER): Payer: PPO | Admitting: Cardiology

## 2015-09-29 ENCOUNTER — Encounter: Payer: Self-pay | Admitting: Cardiology

## 2015-09-29 VITALS — BP 114/64 | HR 72 | Ht 64.0 in | Wt 132.5 lb

## 2015-09-29 DIAGNOSIS — R002 Palpitations: Secondary | ICD-10-CM | POA: Diagnosis not present

## 2015-09-29 DIAGNOSIS — R0602 Shortness of breath: Secondary | ICD-10-CM

## 2015-09-29 DIAGNOSIS — I499 Cardiac arrhythmia, unspecified: Secondary | ICD-10-CM | POA: Diagnosis not present

## 2015-09-29 NOTE — Patient Instructions (Signed)
Your physician recommends that you schedule a follow-up appointment in: 2 Months  Your physician has requested that you have an echocardiogram. Echocardiography is a painless test that uses sound waves to create images of your heart. It provides your doctor with information about the size and shape of your heart and how well your heart's chambers and valves are working. This procedure takes approximately one hour. There are no restrictions for this procedure.  Your physician has recommended that you wear an event monitor. Event monitors are medical devices that record the heart's electrical activity. Doctors most often [place] these monitors to diagnose arrhythmias. Arrhythmias are problems with the speed or rhythm of the heartbeat. The monitor is a small, portable device. You can wear one while you do your normal daily activities. This is usually used to diagnose what is causing palpitations/syncope (passing out).  Your physician has recommended you have a Coronary Calcium Scoring Test.

## 2015-09-29 NOTE — Progress Notes (Signed)
Cardiology Office Note   Date:  09/29/2015   ID:  Susan Bray, DOB [birth year], MRN [id number]  PCP:  Donnie Coffin, MD  Cardiologist:   Minus Breeding, MD   Chief Complaint  Patient presents with  . Palpitations      History of Present Illness: Susan Bray is a 69 y.o. female who presents for evaluation of palpitations, facial numbness and diplopia. The patient has no past cardiac history. She does have a negative stress perfusion study in 2014. She's had this was obtained to evaluate palpitations. She reports that she's been having about 6 months of facial numbness. This seems to be the left side of her face. It comes and goes. She's also had diplopia. She saw ophthalmologist and was told that she had muscle weakness around her eyes. She had an MRI which was negative. She's had a workup which was negative for myasthenia. I do notice some blood work TSH was normal. CRP was elevated. The patient also has palpitations. She'll feel skipping heartbeats when she lies on her left side at night. She rolls over and they go away. She does not have presyncope or syncope. She doesn't notice palpitations in conjunction with her blurred vision. She has been noted to have PACs on monitor. She has a vague history of mitral valve prolapse though I don't see an echo. She does have fatigue. She has a nonproductive cough. She is not however describing chest pressure, neck or arm discomfort. She's had no PND or orthopnea. She's had no weight gain or edema.  Past Medical History  Diagnosis Date  . Hyperlipidemia   . MVP (mitral valve prolapse)   . IBS (irritable bowel syndrome)   . VIN III (vulvar intraepithelial neoplasia III)   . Cataract     Past Surgical History  Procedure Laterality Date  . Abdominal hysterectomy  1978  . Tummy tuck  1982  . Upper colpectomy  09/1993  . Cataract extraction  08/17/04 ; 09/12/2004    left eye and right eye  . Laser vaporization of hg vaginal dysplasia  12/2006    . P & e repairs with cardinal uterosatral coloposusension (pelvic area repair)  03/03/09  . 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 Cataract extraction       Current Outpatient Prescriptions  Medication Sig Dispense Refill  . amoxicillin (AMOXIL) 500 MG tablet Take 500 mg by mouth 2 (two) times daily.    . pravastatin (PRAVACHOL) 10 MG tablet Take 10 mg by mouth daily.    . sodium chloride (OCEAN) 0.65 % SOLN nasal spray Place 1 spray into both nostrils as needed for congestion.     No current facility-administered medications for this visit.    Allergies:   Review of patient's allergies indicates no known allergies.    Social History:  The patient  reports that she has never smoked. She does not have any smokeless tobacco history on file. She reports that she does not drink alcohol or use illicit drugs.   Family History:  The patient's family history includes Arthritis in her mother; CAD (age of onset: 37) in her brother; Cancer in her mother; Heart attack (age of onset: 33) in her mother; Heart attack (age of onset: 90) in her father; Heart disease in her maternal grandmother; Hypertension in her mother; Peripheral vascular disease (age of onset: 63) in her sister.    ROS:  Please see the history of present illness.   Otherwise, review of systems are positive  for none.   All other systems are reviewed and negative.    PHYSICAL EXAM: VS:  BP 114/64 mmHg  Pulse 72  Ht 5\' 4"  (1.626 m)  Wt 132 lb 8 oz (60.102 kg)  BMI 22.73 kg/m2 , BMI Body mass index is 22.73 kg/(m^2). GENERAL:  Well appearing HEENT:  Pupils equal round and reactive, fundi not visualized, oral mucosa unremarkable NECK:  No jugular venous distention, waveform within normal limits, carotid upstroke brisk and symmetric, no bruits, no thyromegaly LYMPHATICS:  No cervical, inguinal adenopathy LUNGS:  Clear to auscultation bilaterally BACK:  No CVA tenderness CHEST:  Unremarkable HEART:  PMI not displaced or  sustained,S1 and S2 within normal limits, no S3, no S4, no clicks, no rubs, no murmurs ABD:  Flat, positive bowel sounds normal in frequency in pitch, no bruits, no rebound, no guarding, no midline pulsatile mass, no hepatomegaly, no splenomegaly EXT:  2 plus pulses throughout, no edema, no cyanosis no clubbing SKIN:  No rashes no nodules NEURO:  Cranial nerves II through XII grossly intact, motor grossly intact throughout PSYCH:  Cognitively intact, oriented to person place and time    EKG:  EKG is ordered today. The ekg ordered today demonstrates sinus rhythm, rate 72, axis within normal limits, intervals within normal limits, no acute ST-T wave changes.   Recent Labs: 09/12/2015: Creatinine, Ser 1.05*    Lipid Panel    Component Value Date/Time   CHOL 180 09/08/2014 0912   TRIG 106.0 09/08/2014 0912   HDL 46.20 09/08/2014 0912   CHOLHDL 4 09/08/2014 0912   VLDL 21.2 09/08/2014 0912   LDLCALC 113* 09/08/2014 0912      Wt Readings from Last 3 Encounters:  09/29/15 132 lb 8 oz (60.102 kg)  09/08/14 124 lb 6.4 oz (56.427 kg)  07/29/14 124 lb 12.8 oz (56.609 kg)      Other studies Reviewed: Additional studies/ records that were reviewed today include: Office records and [employer] Review of the above records demonstrates:  Please see elsewhere in the note.     ASSESSMENT AND PLAN:  PALPITATIONS:  I suspect PACs. However, given the neurologic issues I need to make sure she doesn't have fibrillation and that she has a structurally normal heart. I'm going to check an echocardiogram and an event monitor.Susan Bray will need a 21 day event monitor.  The patients symptoms necessitate an event monitor.  The symptoms are too infrequent to be identified on a Holter monitor.    FATIGUE:  She has a strong family history of CAD.  I will order a coronary calcium score.  DYSLIPIDEMIA:  Her LDL was recently 171. However, she stopped her statin. She just restarted it. I  will defer to Donnie Coffin, MD  Current medicines are reviewed at length with the patient today.  The patient does not have concerns regarding medicines.  The following changes have been made:  no change  Labs/ tests ordered today include:  Event monitor, Coronary calcium score, echo.    Disposition:   FU with me after the event monitor.     Signed, Minus Breeding, MD  09/29/2015 2:51 PM    [facility]

## 2015-10-03 ENCOUNTER — Ambulatory Visit (INDEPENDENT_AMBULATORY_CARE_PROVIDER_SITE_OTHER): Payer: PPO

## 2015-10-03 ENCOUNTER — Encounter: Payer: Self-pay | Admitting: Cardiology

## 2015-10-03 DIAGNOSIS — I499 Cardiac arrhythmia, unspecified: Secondary | ICD-10-CM | POA: Diagnosis not present

## 2015-10-03 DIAGNOSIS — R002 Palpitations: Secondary | ICD-10-CM

## 2015-10-06 ENCOUNTER — Ambulatory Visit: Payer: PPO | Admitting: Cardiology

## 2015-10-19 ENCOUNTER — Other Ambulatory Visit: Payer: Self-pay

## 2015-10-19 ENCOUNTER — Telehealth: Payer: Self-pay | Admitting: Cardiology

## 2015-10-19 ENCOUNTER — Ambulatory Visit (HOSPITAL_COMMUNITY): Payer: PPO | Attending: Cardiovascular Disease

## 2015-10-19 ENCOUNTER — Ambulatory Visit (INDEPENDENT_AMBULATORY_CARE_PROVIDER_SITE_OTHER)
Admission: RE | Admit: 2015-10-19 | Discharge: 2015-10-19 | Disposition: A | Discharge status: Home / Self Care | Payer: PPO | Source: Ambulatory Visit | Attending: Cardiology | Admitting: Cardiology

## 2015-10-19 DIAGNOSIS — R002 Palpitations: Secondary | ICD-10-CM | POA: Insufficient documentation

## 2015-10-19 DIAGNOSIS — R0602 Shortness of breath: Secondary | ICD-10-CM

## 2015-10-19 DIAGNOSIS — I340 Nonrheumatic mitral (valve) insufficiency: Secondary | ICD-10-CM | POA: Diagnosis not present

## 2015-10-19 DIAGNOSIS — E785 Hyperlipidemia, unspecified: Secondary | ICD-10-CM | POA: Insufficient documentation

## 2015-10-19 DIAGNOSIS — I071 Rheumatic tricuspid insufficiency: Secondary | ICD-10-CM | POA: Diagnosis not present

## 2015-10-19 DIAGNOSIS — I499 Cardiac arrhythmia, unspecified: Secondary | ICD-10-CM

## 2015-10-19 NOTE — Telephone Encounter (Signed)
[facility] called with a message from Dr. Marily Lente, radiologist for Dr. Percival Spanish: Studies show: Chronic interstitial sub-pleural lung disease

## 2015-10-25 ENCOUNTER — Telehealth: Payer: Self-pay | Admitting: Cardiology

## 2015-10-25 NOTE — Telephone Encounter (Signed)
Returned call to patient.Advised results of echo and calcium score not available.Message sent to [place].

## 2015-10-25 NOTE — Telephone Encounter (Signed)
Pt called in wanting to speak with a nurse about the results to her Echo and Calcuim score that was done on 12/28. Please f/u   Thanks

## 2015-10-26 ENCOUNTER — Other Ambulatory Visit (INDEPENDENT_AMBULATORY_CARE_PROVIDER_SITE_OTHER): Payer: PPO

## 2015-10-26 ENCOUNTER — Ambulatory Visit (INDEPENDENT_AMBULATORY_CARE_PROVIDER_SITE_OTHER): Payer: PPO | Admitting: Neurology

## 2015-10-26 ENCOUNTER — Encounter: Payer: Self-pay | Admitting: Neurology

## 2015-10-26 ENCOUNTER — Ambulatory Visit: Payer: PPO | Admitting: Cardiology

## 2015-10-26 VITALS — BP 110/60 | HR 69 | Ht 64.0 in | Wt 129.0 lb

## 2015-10-26 DIAGNOSIS — H532 Diplopia: Secondary | ICD-10-CM

## 2015-10-26 NOTE — Progress Notes (Signed)
Chart forwarded.  

## 2015-10-26 NOTE — Progress Notes (Addendum)
NEUROLOGY CONSULTATION NOTE  Susan Bray MRN: [id number] DOB: [birth year]  Referring provider: Dr. Alroy Dust Primary care provider: Dr. Alroy Dust  Reason for consult:  diplopia  HISTORY OF PRESENT ILLNESS: Susan Bray is a 70 year old right-handed female with hyperlipidemia, MVP, and IBS vulvar intraepithelial neoplasia III who presents for intermittent facial numbness and diplopia.  History obtained by patient and PCP note.  Labs and imaging of brain MRI reviewed.  One year ago, she started noticing double vision.  It is oblique.  She notices it when looking at distance, such as driving or watching TV.  She doesn't really notice it too much when her head is straight.  However, if she side-bends her head to the left, it is worse.  She also notices it with side-bending to the right, but not as severe.  She thinks it is worse towards the end of the day.  She denies headache.  She was evaluated by an ophthalmologist, Dr. Julian Reil, who diagnosed her with a right 6th nerve palsy.  MRI of the brain with and without contrast was unremarkable without any evidence of mass lesion or abnormal enhancement of cranial nerves. TSH was 2.09.  Sed Rate was elevated at 56 and CRP at 96.1.  A myasthenia panel was ordered but the labs were lost.  About 6 months ago, she had numbness of the left cheek.  There were no other symptoms and it lasted about a week.  She had two other episodes as well.  The last episode was in December.  Last month, she reported increased fatigue and not feeling well.  She was treated with an antibiotic for viral illness and she felt better.  She was evaluated by cardiology for palpitations, which are suspected to be PACs, but a Holter was placed for evaluation.  She was prescribed prism glasses, which were ineffective.  She denies any head trauma.  PAST MEDICAL HISTORY: Past Medical History  Diagnosis Date  . Hyperlipidemia   . MVP (mitral valve prolapse)   . IBS (irritable bowel  syndrome)   . VIN III (vulvar intraepithelial neoplasia III)   . Cataract     PAST SURGICAL HISTORY: Past Surgical History  Procedure Laterality Date  . Abdominal hysterectomy  1978  . Tummy tuck  1982  . Upper colpectomy  09/1993  . Cataract extraction  08/17/04 ; 09/12/2004    left eye and right eye  . Laser vaporization of hg vaginal dysplasia  12/2006  . P & e repairs with cardinal uterosatral coloposusension (pelvic area repair)  03/03/09  . 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 Cataract extraction      MEDICATIONS: Current Outpatient Prescriptions on File Prior to Visit  Medication Sig Dispense Refill  . pravastatin (PRAVACHOL) 10 MG tablet Take 10 mg by mouth daily.     No current facility-administered medications on file prior to visit.    ALLERGIES: No Known Allergies  FAMILY HISTORY: Family History  Problem Relation Age of Onset  . Arthritis Mother   . Cancer Mother     breast  . Hypertension Mother   . Heart attack Mother 40  . Heart attack Father 55  . Heart disease Maternal Grandmother   . CAD Brother 54  . Peripheral vascular disease Sister 43    SOCIAL HISTORY: Social History   Social History  . Marital Status: Married    Spouse Name: N/A  . Number of Children: 3  . Years of Education: N/A   Occupational  History  . Not on file.   Social History Main Topics  . Smoking status: Never Smoker   . Smokeless tobacco: Not on file  . Alcohol Use: No  . Drug Use: No  . Sexual Activity: Not on file   Other Topics Concern  . Not on file   Social History Narrative   Married   Retired   3 pregnancies   3 live births   8-9 hours of sleep a night    Lives with son and two grandchildren   Some college.     REVIEW OF SYSTEMS: Constitutional: No fevers, chills, or sweats, no generalized fatigue, change in appetite Eyes: No visual changes, double vision, eye pain Ear, nose and throat: No hearing loss, ear pain, nasal congestion, sore  throat Cardiovascular: No chest pain, palpitations Respiratory:  No shortness of breath at rest or with exertion, wheezes GastrointestinaI: No nausea, vomiting, diarrhea, abdominal pain, fecal incontinence Genitourinary:  No dysuria, urinary retention or frequency Musculoskeletal:  No neck pain, back pain Integumentary: No rash, pruritus, skin lesions Neurological: as above Psychiatric: No depression, insomnia, anxiety Endocrine: No palpitations, fatigue, diaphoresis, mood swings, change in appetite, change in weight, increased thirst Hematologic/Lymphatic:  No anemia, purpura, petechiae. Allergic/Immunologic: no itchy/runny eyes, nasal congestion, recent allergic reactions, rashes  PHYSICAL EXAM: Filed Vitals:   10/26/15 1007  BP: 110/60  Pulse: 69   General: No acute distress.  Patient appears well-groomed.  Head:  Normocephalic/atraumatic Eyes:  fundi unremarkable, without vessel changes, exudates, hemorrhages or papilledema. Neck: supple, no paraspinal tenderness, full range of motion Back: No paraspinal tenderness Heart: regular rate and rhythm Lungs: Clear to auscultation bilaterally. Vascular: No carotid bruits. Neurological Exam: Mental status: alert and oriented to person, place, and time, recent and remote memory intact, fund of knowledge intact, attention and concentration intact, speech fluent and not dysarthric, language intact. Cranial nerves: CN I: not tested CN II: pupils equal, round and reactive to light, visual fields intact, fundi unremarkable, without vessel changes, exudates, hemorrhages or papilledema. CN III, IV, VI:  full range of motion although there may be a mild lag in right eye abduction when tracking to the right, no nystagmus, no ptosis.  She reports worsening vertical diplopia with head tilt to the left, as well as gaze to the right. CN V: facial sensation intact CN VII: upper and lower face symmetric CN VIII: hearing intact CN IX, X: gag intact,  uvula midline CN XI: sternocleidomastoid and trapezius muscles intact CN XII: tongue midline Bulk & Tone: normal, no fasciculations. Motor:  5/5 throughout  Sensation: temperature and vibration sensation intact. . Deep Tendon Reflexes:  2+ throughout, toes downgoing.  Finger to nose testing:  Without dysmetria.  Heel to shin:  Without dysmetria.  Gait:  Normal station and stride.  Able to turn and tandem walk. Romberg with sway.  IMPRESSION: Primarily vertical (or oblique) diplopia.  No etiology noted on MRI.  She has are suggestive but no classic for right sixth nerve palsy.  She has symptoms suggestive of possible left fourth nerve palsy as well.  Consider myasthenia gravis. Intermittent left facial numbness.  Again, etiology unclear.  No evidence of stroke or abnormal enhancement of cranial nerves on MRI.  TIA is possible.  PLAN: 1.  Will need to check myasthenia panel 2.  If blood work is unremarkable, consider NCV for repetitive nerve stimulation test, or referral to neuro-ophthalmology at [facility]  Thank you for allowing me to take part in the care of this  patient.  Metta Clines, DO  CC:  Donnie Coffin, MD

## 2015-10-26 NOTE — Patient Instructions (Signed)
1.  Will check myasthenia gravis panel 2.  Will review notes from eye doctor 3.  Further recommendations pending review of the blood test and eye doctor note.  May need referral to neuro-ophthalmologist in [place] at [place].

## 2015-10-27 ENCOUNTER — Other Ambulatory Visit: Payer: Self-pay | Admitting: *Deleted

## 2015-10-27 DIAGNOSIS — R9430 Abnormal result of cardiovascular function study, unspecified: Secondary | ICD-10-CM

## 2015-10-27 DIAGNOSIS — R002 Palpitations: Secondary | ICD-10-CM

## 2015-11-01 ENCOUNTER — Telehealth: Payer: Self-pay | Admitting: Neurology

## 2015-11-01 NOTE — Telephone Encounter (Signed)
Blood work is normal.  I would recommend referral to neuro-ophthalmology at [facility] with follow up afterwards.

## 2015-11-01 NOTE — Telephone Encounter (Signed)
Message relayed to patient. Verbalized understanding and denied questions. Pt would like to hold off on referral. States she has had a neurologist and an opthalmology look at her and would just like to wait. May reconsider if worseness.

## 2015-11-15 ENCOUNTER — Ambulatory Visit (INDEPENDENT_AMBULATORY_CARE_PROVIDER_SITE_OTHER): Payer: PPO | Admitting: Pharmacist

## 2015-11-15 ENCOUNTER — Ambulatory Visit (HOSPITAL_BASED_OUTPATIENT_CLINIC_OR_DEPARTMENT_OTHER): Payer: PPO

## 2015-11-15 ENCOUNTER — Ambulatory Visit (HOSPITAL_COMMUNITY): Payer: PPO | Attending: Internal Medicine

## 2015-11-15 VITALS — Wt 128.0 lb

## 2015-11-15 DIAGNOSIS — R002 Palpitations: Secondary | ICD-10-CM | POA: Diagnosis not present

## 2015-11-15 DIAGNOSIS — E785 Hyperlipidemia, unspecified: Secondary | ICD-10-CM | POA: Diagnosis not present

## 2015-11-15 DIAGNOSIS — R9430 Abnormal result of cardiovascular function study, unspecified: Secondary | ICD-10-CM | POA: Diagnosis not present

## 2015-11-15 DIAGNOSIS — R0989 Other specified symptoms and signs involving the circulatory and respiratory systems: Secondary | ICD-10-CM

## 2015-11-15 LAB — LIPID PANEL
CHOL/HDL RATIO: 3.2 ratio (ref <=5.0)
CHOLESTEROL: 149 mg/dL (ref 125–200)
HDL: 47 mg/dL (ref >=46)
LDL Cholesterol: 79 mg/dL (ref <130)
TRIGLYCERIDES: 117 mg/dL (ref <150)
VLDL: 23 mg/dL (ref <30)

## 2015-11-15 LAB — HEPATIC FUNCTION PANEL
ALBUMIN: 3.9 g/dL (ref 3.6–5.1)
ALK PHOS: 48 U/L (ref 33–130)
ALT: 16 U/L (ref 6–29)
AST: 22 U/L (ref 10–35)
BILIRUBIN TOTAL: 0.5 mg/dL (ref 0.2–1.2)
Bilirubin, Direct: 0.1 mg/dL (ref <=0.2)
Indirect Bilirubin: 0.4 mg/dL (ref 0.2–1.2)
TOTAL PROTEIN: 7 g/dL (ref 6.1–8.1)

## 2015-11-15 NOTE — Patient Instructions (Signed)
Thank you for coming in today  Call clinic once you have spoken to insurance about Crestor (rosuvastatin) or Lipitor (atorvastatin). [phone]  We will call you with results of lipid panel from today

## 2015-11-15 NOTE — Progress Notes (Signed)
Patient ID:  Susan Bray                DOB:  [birth year]                        MRN:  [id number]     HPI: Susan Bray is a 70 y.o. female patient referred to lipid clinic by Dr. Percival Spanish. Her coronary calcium score is markedly elevated at 852 and she has a strong family history of CAD. Her most recent LDL was 171. At that time she had been off statin therapy for about 6 months. She was previously on pravastatin 20mg  daily for about 2 years when she developed muscle aches and stopped the medication. Since her most recent lipid panel she has restarted the pravastatin at a lower dose (10mg  daily) and tolerated it well.   Current Medications: pravastatin 10mg  daily Intolerances: pravastatin 20mg  daily Risk Factors: elevated coronary calcium score, strong family history of CAD LDL goal: <70  Diet: She reports she eats a diet low in fats and most of her meals are prepared at home. She eats mostly fruits and vegetables. When she eats meat she eats chicken. She reports that she does eat a lot of oatmeal for breakfast.   Exercise: No formal exercise plan. She tries to do 30-40 minutes of exercise every morning while watching tv. If the weather permits she rakes her yard and walks her dog.   Family History: The patient's family history includes Arthritis in her mother; CAD (age of onset: 4) in her brother; Cancer in her mother; Heart attack (age of onset: 47) in her mother; Heart attack (age of onset: 89) in her father; Heart disease in her maternal grandmother; Hypertension in her mother; Peripheral vascular disease (age of onset: 43) in her sister.   Social History: never smoker.   Labs: TC: 255  TG: 125  HDL: 59  LDL: 171  New Lipid panel on Pravastatin 10mg  dialy: TC: 149  TG: 117  HDL: 47  LDL: 79   Past Medical History  Diagnosis Date  . Hyperlipidemia   . MVP (mitral valve prolapse)   . IBS (irritable bowel syndrome)   . VIN III (vulvar intraepithelial neoplasia III)   . Cataract      Current Outpatient Prescriptions on File Prior to Visit  Medication Sig Dispense Refill  . pravastatin (PRAVACHOL) 10 MG tablet Take 10 mg by mouth daily.     No current facility-administered medications on file prior to visit.    No Known Allergies  Assessment/Plan: Hyperlipidemia:  Lipid panel and hepatic panel today after being on pravastatin 10mg  daily for 2-3 months. Based on the new lipid panel with significant reduction in LDL on pravastatin 10mg  will try to titrate pravastatin dose back to 20mg  daily. Instructed patient to start taking two 10mg  tablets daily and call the clinic if she is unable to tolerate the higher dose. Follow-up with Dr. Percival Spanish. Follow-up labs in 3 months.   Thank you, Susan Pons. Bray [place], [place]  [id number], [place], [place] [address]  Phone: [phone]; Fax: [phone] 11/16/2015 11:32 AM

## 2015-11-16 LAB — ECHOCARDIOGRAM STRESS TEST
CHL CUP STRESS STAGE 2 GRADE: 0 %
CHL CUP STRESS STAGE 3 SPEED: 0 mph
CHL CUP STRESS STAGE 4 SPEED: 1.7 mph
CHL CUP STRESS STAGE 5 HR: 141 {beats}/min
CHL CUP STRESS STAGE 6 SPEED: 2.5 mph
CHL CUP STRESS STAGE 7 DBP: 69 mmHg
CHL CUP STRESS STAGE 7 SBP: 161 mmHg
CHL CUP STRESS STAGE 8 SBP: 146 mmHg
CSEPEW: 7 METS
Peak HR: 141 {beats}/min
Percent of predicted max HR: 92 %
Stage 1 HR: 72 {beats}/min
Stage 2 HR: 69 {beats}/min
Stage 2 Speed: 0 mph
Stage 3 Grade: 0 %
Stage 3 HR: 68 {beats}/min
Stage 4 DBP: 67 mmHg
Stage 4 Grade: 10 %
Stage 4 HR: 114 {beats}/min
Stage 4 SBP: 152 mmHg
Stage 5 DBP: 70 mmHg
Stage 5 Grade: 12 %
Stage 5 SBP: 150 mmHg
Stage 5 Speed: 2.5 mph
Stage 6 Grade: 12 %
Stage 6 HR: 141 {beats}/min
Stage 7 Grade: 0 %
Stage 7 HR: 107 {beats}/min
Stage 7 Speed: 0 mph
Stage 8 DBP: 65 mmHg
Stage 8 Grade: 0 %
Stage 8 HR: 68 {beats}/min
Stage 8 Speed: 0 mph

## 2015-11-22 DIAGNOSIS — Z23 Encounter for immunization: Secondary | ICD-10-CM | POA: Diagnosis not present

## 2015-11-22 DIAGNOSIS — R0600 Dyspnea, unspecified: Secondary | ICD-10-CM | POA: Diagnosis not present

## 2015-11-22 DIAGNOSIS — E7800 Pure hypercholesterolemia, unspecified: Secondary | ICD-10-CM | POA: Diagnosis not present

## 2015-11-22 DIAGNOSIS — Z1211 Encounter for screening for malignant neoplasm of colon: Secondary | ICD-10-CM | POA: Diagnosis not present

## 2015-11-22 DIAGNOSIS — Z0000 Encounter for general adult medical examination without abnormal findings: Secondary | ICD-10-CM | POA: Diagnosis not present

## 2015-11-22 DIAGNOSIS — H532 Diplopia: Secondary | ICD-10-CM | POA: Diagnosis not present

## 2015-11-23 ENCOUNTER — Other Ambulatory Visit: Payer: Self-pay

## 2015-11-23 DIAGNOSIS — Z1231 Encounter for screening mammogram for malignant neoplasm of breast: Secondary | ICD-10-CM

## 2015-11-28 DIAGNOSIS — Z1211 Encounter for screening for malignant neoplasm of colon: Secondary | ICD-10-CM | POA: Diagnosis not present

## 2015-12-02 ENCOUNTER — Encounter: Payer: Self-pay | Admitting: *Deleted

## 2015-12-02 ENCOUNTER — Ambulatory Visit (INDEPENDENT_AMBULATORY_CARE_PROVIDER_SITE_OTHER): Payer: PPO | Admitting: Cardiology

## 2015-12-02 ENCOUNTER — Encounter: Payer: Self-pay | Admitting: Cardiology

## 2015-12-02 VITALS — BP 110/62 | HR 68 | Ht 63.5 in | Wt 127.8 lb

## 2015-12-02 DIAGNOSIS — R0789 Other chest pain: Secondary | ICD-10-CM | POA: Diagnosis not present

## 2015-12-02 NOTE — Progress Notes (Signed)
Cardiology Office Note   Date:  12/02/2015   ID:  Susan Bray, DOB 1946-05-12, MRN [id number]  PCP:  Donnie Coffin, MD  Cardiologist:   Minus Breeding, MD   No chief complaint on file.     History of Present Illness: Susan Bray is a 70 y.o. female who presents for evaluation of progressive dyspnea.  She had palpitations and she did wear an event monitor which demonstrated no significant dysrhythmias. She's had a negative workup for myasthenia for multiple symptoms including blurred vision.  She does have a negative stress perfusion study in 2014. Because she was complaining of some fatigue and shortness of breath I sent her for a coronary calcium score of 852 which was 96%.  I sent her for follow-up echo stress testing.  When this was negative. However, she continues to get increasing dyspnea with mild exertion such as walking a short distance on level ground to her mailbox. She's having increasing fatigue and decreasing exercise tolerance. She's not describing chest pressure, neck or arm discomfort. Her palpitations are actually slightly improved. She's not had any presyncope or syncope. She's not having PND or orthopnea.  Past Medical History  Diagnosis Date  . Hyperlipidemia   . MVP (mitral valve prolapse)   . IBS (irritable bowel syndrome)   . VIN III (vulvar intraepithelial neoplasia III)   . Cataract     Past Surgical History  Procedure Laterality Date  . Abdominal hysterectomy  1978  . Tummy tuck  1982  . Upper colpectomy  09/1993  . Cataract extraction  08/17/04 ; 09/12/2004    left eye and right eye  . Laser vaporization of hg vaginal dysplasia  12/2006  . P & e repairs with cardinal uterosatral coloposusension (pelvic area repair)  03/03/09  . 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 Cataract extraction       Current Outpatient Prescriptions  Medication Sig Dispense Refill  . pravastatin (PRAVACHOL) 10 MG tablet Take 2 tablets (20 mg total) by mouth daily.      No current facility-administered medications for this visit.    Allergies:   Review of patient's allergies indicates no known allergies.    Social History:  The patient  reports that she has never smoked. She does not have any smokeless tobacco history on file. She reports that she does not drink alcohol or use illicit drugs.   Family History:  The patient's family history includes Arthritis in her mother; CAD (age of onset: 51) in her brother; Cancer in her mother; Heart attack (age of onset: 72) in her mother; Heart attack (age of onset: 30) in her father; Heart disease in her maternal grandmother; Hypertension in her mother; Peripheral vascular disease (age of onset: 58) in her sister.    ROS:  Please see the history of present illness.   Otherwise, review of systems are positive for none.   All other systems are reviewed and negative.    PHYSICAL EXAM: VS:  Ht 5' 10.5" (1.791 m)  Wt 190 lb 11.2 oz (86.501 kg)  BMI 26.97 kg/m2 , BMI Body mass index is 26.97 kg/(m^2). GENERAL:  Well appearing HEENT:  Pupils equal round and reactive, fundi not visualized, oral mucosa unremarkable NECK:  No jugular venous distention, waveform within normal limits, carotid upstroke brisk and symmetric, no bruits, no thyromegaly LYMPHATICS:  No cervical, inguinal adenopathy LUNGS:  Clear to auscultation bilaterally BACK:  No CVA tenderness CHEST:  Unremarkable HEART:  PMI not displaced or sustained,S1  and S2 within normal limits, no S3, no S4, no clicks, no rubs, no murmurs ABD:  Flat, positive bowel sounds normal in frequency in pitch, no bruits, no rebound, no guarding, no midline pulsatile mass, no hepatomegaly, no splenomegaly EXT:  2 plus pulses throughout, no edema, no cyanosis no clubbing     EKG:  EKG is not ordered today.   Recent Labs: 09/12/2015: Creatinine, Ser 1.05* 11/15/2015: ALT 16    Lipid Panel    Component Value Date/Time   CHOL 149 11/15/2015 0901   TRIG 117 11/15/2015  0901   HDL 47 11/15/2015 0901   CHOLHDL 3.2 11/15/2015 0901   VLDL 23 11/15/2015 0901   LDLCALC 79 11/15/2015 0901      Wt Readings from Last 3 Encounters:  12/02/15 190 lb 11.2 oz (86.501 kg)  11/15/15 128 lb (58.06 kg)  10/26/15 129 lb (58.514 kg)      Other studies Reviewed: Additional studies/ records that were reviewed today include:  Calcium score and DSE Review of the above records demonstrates:  Please see elsewhere in the note.     ASSESSMENT AND PLAN:  PALPITATIONS:  These are less symptomatic. No change in therapy is indicated.  DYSPNEA: I suspect this represents angina and is progressive.  (Class IV).  Therefore cardiac cath is indicated. The patient understands that risks included but are not limited to stroke (1 in 1000), death (1 in 17), kidney failure [usually temporary] (1 in 500), bleeding (1 in 200), allergic reaction [possibly serious] (1 in 200).  The patient understands and agrees to proceed.   DYSLIPIDEMIA:  Her LDL was recently 171. However, she stopped her statin.  I will try to convince her to restart this.  Current medicines are reviewed at length with the patient today.  The patient does not have concerns regarding medicines.  The following changes have been made:  no change  Labs/ tests ordered today include:  Cath    Disposition:   FU with me after cath.    Signed, Minus Breeding, MD  12/02/2015 9:21 AM    [place]

## 2015-12-02 NOTE — Patient Instructions (Signed)
Your physician has requested that you have a cardiac catheterization. Cardiac catheterization is used to diagnose and/or treat various heart conditions. Doctors may recommend this procedure for a number of different reasons. The most common reason is to evaluate chest pain. Chest pain can be a symptom of coronary artery disease (CAD), and cardiac catheterization can show whether plaque is narrowing or blocking your heart's arteries. This procedure is also used to evaluate the valves, as well as measure the blood flow and oxygen levels in different parts of your heart. For further information please visit [email or web address]. Please follow instruction sheet, as given.   Your physician recommends that you return for lab work in: Monday 12/05/15

## 2015-12-04 ENCOUNTER — Encounter: Payer: Self-pay | Admitting: Cardiology

## 2015-12-05 DIAGNOSIS — R0789 Other chest pain: Secondary | ICD-10-CM | POA: Diagnosis not present

## 2015-12-05 LAB — PROTIME-INR
INR: 0.97 (ref <1.50)
Prothrombin Time: 13 seconds (ref 11.6–15.2)

## 2015-12-05 LAB — CBC
HEMATOCRIT: 39.1 % (ref 36.0–46.0)
HEMOGLOBIN: 12.3 g/dL (ref 12.0–15.0)
MCH: 27.2 pg (ref 26.0–34.0)
MCHC: 31.5 g/dL (ref 30.0–36.0)
MCV: 86.5 fL (ref 78.0–100.0)
MPV: 10.4 fL (ref 8.6–12.4)
Platelets: 228 10*3/uL (ref 150–400)
RBC: 4.52 MIL/uL (ref 3.87–5.11)
RDW: 14.2 % (ref 11.5–15.5)
WBC: 3.7 10*3/uL — AB (ref 4.0–10.5)

## 2015-12-06 ENCOUNTER — Encounter (HOSPITAL_COMMUNITY): Payer: Self-pay | Admitting: Interventional Cardiology

## 2015-12-06 ENCOUNTER — Encounter (HOSPITAL_COMMUNITY)
Admission: RE | Disposition: A | Discharge status: Home / Self Care | Payer: Self-pay | Source: Ambulatory Visit | Attending: Interventional Cardiology

## 2015-12-06 ENCOUNTER — Ambulatory Visit (HOSPITAL_COMMUNITY)
Admission: RE | Admit: 2015-12-06 | Discharge: 2015-12-06 | Disposition: A | Discharge status: Home / Self Care | Payer: PPO | Source: Ambulatory Visit | Attending: Interventional Cardiology | Admitting: Interventional Cardiology

## 2015-12-06 DIAGNOSIS — Z8249 Family history of ischemic heart disease and other diseases of the circulatory system: Secondary | ICD-10-CM | POA: Insufficient documentation

## 2015-12-06 DIAGNOSIS — R0600 Dyspnea, unspecified: Secondary | ICD-10-CM

## 2015-12-06 DIAGNOSIS — I2510 Atherosclerotic heart disease of native coronary artery without angina pectoris: Secondary | ICD-10-CM | POA: Diagnosis not present

## 2015-12-06 DIAGNOSIS — R0789 Other chest pain: Secondary | ICD-10-CM | POA: Insufficient documentation

## 2015-12-06 DIAGNOSIS — I341 Nonrheumatic mitral (valve) prolapse: Secondary | ICD-10-CM | POA: Diagnosis not present

## 2015-12-06 DIAGNOSIS — E785 Hyperlipidemia, unspecified: Secondary | ICD-10-CM | POA: Diagnosis not present

## 2015-12-06 DIAGNOSIS — K589 Irritable bowel syndrome without diarrhea: Secondary | ICD-10-CM | POA: Insufficient documentation

## 2015-12-06 DIAGNOSIS — R079 Chest pain, unspecified: Secondary | ICD-10-CM

## 2015-12-06 HISTORY — PX: CARDIAC CATHETERIZATION: SHX172

## 2015-12-06 LAB — BASIC METABOLIC PANEL
BUN: 11 mg/dL (ref 7–25)
CHLORIDE: 105 mmol/L (ref 98–110)
CO2: 31 mmol/L (ref 20–31)
Calcium: 10 mg/dL (ref 8.6–10.4)
Creat: 0.81 mg/dL (ref 0.50–0.99)
GLUCOSE: 89 mg/dL (ref 65–99)
POTASSIUM: 4.4 mmol/L (ref 3.5–5.3)
SODIUM: 141 mmol/L (ref 135–146)

## 2015-12-06 SURGERY — LEFT HEART CATH AND CORONARY ANGIOGRAPHY
Anesthesia: LOCAL

## 2015-12-06 MED ORDER — MIDAZOLAM HCL 2 MG/2ML IJ SOLN
INTRAMUSCULAR | Status: DC | PRN
  Administered 2015-12-06: 1 mg via INTRAVENOUS

## 2015-12-06 MED ORDER — FENTANYL CITRATE (PF) 100 MCG/2ML IJ SOLN
INTRAMUSCULAR | Status: DC | PRN
  Administered 2015-12-06: 50 ug via INTRAVENOUS

## 2015-12-06 MED ORDER — ASPIRIN 81 MG PO CHEW: 81.0000 mg | CHEWABLE_TABLET | ORAL | Status: AC

## 2015-12-06 MED ORDER — ACETAMINOPHEN 325 MG PO TABS: 650.0000 mg | ORAL_TABLET | ORAL | Status: DC | PRN

## 2015-12-06 MED ORDER — NITROGLYCERIN 1 MG/10 ML FOR IR/CATH LAB
INTRA_ARTERIAL | Status: AC
  Filled 2015-12-06: qty 10

## 2015-12-06 MED ORDER — LIDOCAINE HCL (PF) 1 % IJ SOLN
INTRAMUSCULAR | Status: DC | PRN
  Administered 2015-12-06: 5 mL via INTRADERMAL

## 2015-12-06 MED ORDER — ASPIRIN 81 MG PO CHEW: 81.0000 mg | CHEWABLE_TABLET | Freq: Every day | ORAL | Status: DC

## 2015-12-06 MED ORDER — SODIUM CHLORIDE 0.9% FLUSH: 3.0000 mL | Freq: Two times a day (BID) | INTRAVENOUS | Status: DC

## 2015-12-06 MED ORDER — HEPARIN SODIUM (PORCINE) 1000 UNIT/ML IJ SOLN
INTRAMUSCULAR | Status: DC | PRN
  Administered 2015-12-06: 3000 [IU] via INTRAVENOUS

## 2015-12-06 MED ORDER — HEPARIN (PORCINE) IN NACL 2-0.9 UNIT/ML-% IJ SOLN
INTRAMUSCULAR | Status: AC
  Filled 2015-12-06: qty 1000

## 2015-12-06 MED ORDER — SODIUM CHLORIDE 0.9 % WEIGHT BASED INFUSION
3.0000 mL/kg/h | INTRAVENOUS | Status: DC
  Administered 2015-12-06: 3 mL/kg/h via INTRAVENOUS

## 2015-12-06 MED ORDER — IOHEXOL 350 MG/ML SOLN
INTRAVENOUS | Status: DC | PRN
  Administered 2015-12-06: 95 mL via INTRA_ARTERIAL

## 2015-12-06 MED ORDER — SODIUM CHLORIDE 0.9% FLUSH: 3.0000 mL | INTRAVENOUS | Status: DC | PRN

## 2015-12-06 MED ORDER — SODIUM CHLORIDE 0.9 % WEIGHT BASED INFUSION: 1.0000 mL/kg/h | INTRAVENOUS | Status: DC

## 2015-12-06 MED ORDER — LIDOCAINE HCL (PF) 1 % IJ SOLN
INTRAMUSCULAR | Status: AC
  Filled 2015-12-06: qty 30

## 2015-12-06 MED ORDER — SODIUM CHLORIDE 0.9 % IV SOLN: 250.0000 mL | INTRAVENOUS | Status: DC | PRN

## 2015-12-06 MED ORDER — VERAPAMIL HCL 2.5 MG/ML IV SOLN
INTRAVENOUS | Status: DC | PRN
  Administered 2015-12-06: 10 mL via INTRA_ARTERIAL

## 2015-12-06 MED ORDER — VERAPAMIL HCL 2.5 MG/ML IV SOLN
INTRAVENOUS | Status: AC
  Filled 2015-12-06: qty 2

## 2015-12-06 MED ORDER — MIDAZOLAM HCL 2 MG/2ML IJ SOLN
INTRAMUSCULAR | Status: AC
  Filled 2015-12-06: qty 2

## 2015-12-06 MED ORDER — ONDANSETRON HCL 4 MG/2ML IJ SOLN: 4.0000 mg | Freq: Four times a day (QID) | INTRAMUSCULAR | Status: DC | PRN

## 2015-12-06 MED ORDER — FENTANYL CITRATE (PF) 100 MCG/2ML IJ SOLN
INTRAMUSCULAR | Status: AC
Start: 2015-12-06 — End: 2015-12-06
  Filled 2015-12-06: qty 2

## 2015-12-06 MED ORDER — HEPARIN (PORCINE) IN NACL 2-0.9 UNIT/ML-% IJ SOLN
INTRAMUSCULAR | Status: DC | PRN
  Administered 2015-12-06: 1000 mL

## 2015-12-06 MED ORDER — SODIUM CHLORIDE 0.9 % WEIGHT BASED INFUSION: 3.0000 mL/kg/h | INTRAVENOUS | Status: DC

## 2015-12-06 SURGICAL SUPPLY — 9 items
CATH INFINITI 5 FR JL3.5 (CATHETERS) ×2 IMPLANT
CATH INFINITI JR4 5F (CATHETERS) ×2 IMPLANT
DEVICE RAD COMP TR BAND LRG (VASCULAR PRODUCTS) ×2 IMPLANT
GLIDESHEATH SLEND A-KIT 6F 22G (SHEATH) ×2 IMPLANT
KIT HEART LEFT (KITS) ×2 IMPLANT
PACK CARDIAC CATHETERIZATION (CUSTOM PROCEDURE TRAY) ×2 IMPLANT
TRANSDUCER W/STOPCOCK (MISCELLANEOUS) ×2 IMPLANT
TUBING CIL FLEX 10 FLL-RA (TUBING) ×2 IMPLANT
WIRE SAFE-T 1.5MM-J .035X260CM (WIRE) ×2 IMPLANT

## 2015-12-06 NOTE — Discharge Instructions (Signed)
Radial Site Care °Refer to this sheet in the next few weeks. These instructions provide you with information about caring for yourself after your procedure. Your health care provider may also give you more specific instructions. Your treatment has been planned according to current medical practices, but problems sometimes occur. Call your health care provider if you have any problems or questions after your procedure. °WHAT TO EXPECT AFTER THE PROCEDURE °After your procedure, it is typical to have the following: °· Bruising at the radial site that usually fades within 1-2 weeks. °· Blood collecting in the tissue (hematoma) that may be painful to the touch. It should usually decrease in size and tenderness within 1-2 weeks. °HOME CARE INSTRUCTIONS °· Take medicines only as directed by your health care provider. °· You may shower 24-48 hours after the procedure or as directed by your health care provider. Remove the bandage (dressing) and gently wash the site with plain soap and water. Pat the area dry with a clean towel. Do not rub the site, because this may cause bleeding. °· Do not take baths, swim, or use a hot tub until your health care provider approves. °· Check your insertion site every day for redness, swelling, or drainage. °· Do not apply powder or lotion to the site. °· Do not flex or bend the affected arm for 24 hours or as directed by your health care provider. °· Do not push or pull heavy objects with the affected arm for 24 hours or as directed by your health care provider. °· Do not lift over 10 lb (4.5 kg) for 5 days after your procedure or as directed by your health care provider. °· Ask your health care provider when it is okay to: °¨ Return to work or school. °¨ Resume usual physical activities or sports. °¨ Resume sexual activity. °· Do not drive home if you are discharged the same day as the procedure. Have someone else drive you. °· You may drive 24 hours after the procedure unless otherwise  instructed by your health care provider. °· Do not operate machinery or power tools for 24 hours after the procedure. °· If your procedure was done as an outpatient procedure, which means that you went home the same day as your procedure, a responsible adult should be with you for the first 24 hours after you arrive home. °· Keep all follow-up visits as directed by your health care provider. This is important. °SEEK MEDICAL CARE IF: °· You have a fever. °· You have chills. °· You have increased bleeding from the radial site. Hold pressure on the site. °SEEK IMMEDIATE MEDICAL CARE IF: °· You have unusual pain at the radial site. °· You have redness, warmth, or swelling at the radial site. °· You have drainage (other than a small amount of blood on the dressing) from the radial site. °· The radial site is bleeding, and the bleeding does not stop after 30 minutes of holding steady pressure on the site. °· Your arm or hand becomes pale, cool, tingly, or numb. °  °This information is not intended to replace advice given to you by your health care provider. Make sure you discuss any questions you have with your health care provider. °  °Document Released: 11/10/2010 Document Revised: 10/29/2014 Document Reviewed: 04/26/2014 °Elsevier Interactive Patient Education ©2016 [employer] ° °

## 2015-12-06 NOTE — H&P (View-Only) (Signed)
Cardiology Office Note   Date:  12/02/2015   ID:  Susan Bray, DOB 13-Sep-1946, MRN [id number]  PCP:  Susan Coffin, MD  Cardiologist:   Minus Breeding, MD   No chief complaint on file.     History of Present Illness: Susan Bray is a 70 y.o. female who presents for evaluation of progressive dyspnea.  She had palpitations and she did wear an event monitor which demonstrated no significant dysrhythmias. She's had a negative workup for myasthenia for multiple symptoms including blurred vision.  She does have a negative stress perfusion study in 2014. Because she was complaining of some fatigue and shortness of breath I sent her for a coronary calcium score of 852 which was 96%.  I sent her for follow-up echo stress testing.  When this was negative. However, she continues to get increasing dyspnea with mild exertion such as walking a short distance on level ground to her mailbox. She's having increasing fatigue and decreasing exercise tolerance. She's not describing chest pressure, neck or arm discomfort. Her palpitations are actually slightly improved. She's not had any presyncope or syncope. She's not having PND or orthopnea.  Past Medical History  Diagnosis Date  . Hyperlipidemia   . MVP (mitral valve prolapse)   . IBS (irritable bowel syndrome)   . VIN III (vulvar intraepithelial neoplasia III)   . Cataract     Past Surgical History  Procedure Laterality Date  . Abdominal hysterectomy  1978  . Tummy tuck  1982  . Upper colpectomy  09/1993  . Cataract extraction  08/17/04 ; 09/12/2004    left eye and right eye  . Laser vaporization of hg vaginal dysplasia  12/2006  . P & e repairs with cardinal uterosatral coloposusension (pelvic area repair)  03/03/09  . 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 Cataract extraction       Current Outpatient Prescriptions  Medication Sig Dispense Refill  . pravastatin (PRAVACHOL) 10 MG tablet Take 2 tablets (20 mg total) by mouth daily.      No current facility-administered medications for this visit.    Allergies:   Review of patient's allergies indicates no known allergies.    Social History:  The patient  reports that she has never smoked. She does not have any smokeless tobacco history on file. She reports that she does not drink alcohol or use illicit drugs.   Family History:  The patient's family history includes Arthritis in her mother; CAD (age of onset: 40) in her brother; Cancer in her mother; Heart attack (age of onset: 78) in her mother; Heart attack (age of onset: 37) in her father; Heart disease in her maternal grandmother; Hypertension in her mother; Peripheral vascular disease (age of onset: 35) in her sister.    ROS:  Please see the history of present illness.   Otherwise, review of systems are positive for none.   All other systems are reviewed and negative.    PHYSICAL EXAM: VS:  Ht 5' 10.5" (1.791 m)  Wt 190 lb 11.2 oz (86.501 kg)  BMI 26.97 kg/m2 , BMI Body mass index is 26.97 kg/(m^2). GENERAL:  Well appearing HEENT:  Pupils equal round and reactive, fundi not visualized, oral mucosa unremarkable NECK:  No jugular venous distention, waveform within normal limits, carotid upstroke brisk and symmetric, no bruits, no thyromegaly LYMPHATICS:  No cervical, inguinal adenopathy LUNGS:  Clear to auscultation bilaterally BACK:  No CVA tenderness CHEST:  Unremarkable HEART:  PMI not displaced or sustained,S1  and S2 within normal limits, no S3, no S4, no clicks, no rubs, no murmurs ABD:  Flat, positive bowel sounds normal in frequency in pitch, no bruits, no rebound, no guarding, no midline pulsatile mass, no hepatomegaly, no splenomegaly EXT:  2 plus pulses throughout, no edema, no cyanosis no clubbing     EKG:  EKG is not ordered today.   Recent Labs: 09/12/2015: Creatinine, Ser 1.05* 11/15/2015: ALT 16    Lipid Panel    Component Value Date/Time   CHOL 149 11/15/2015 0901   TRIG 117 11/15/2015  0901   HDL 47 11/15/2015 0901   CHOLHDL 3.2 11/15/2015 0901   VLDL 23 11/15/2015 0901   LDLCALC 79 11/15/2015 0901      Wt Readings from Last 3 Encounters:  12/02/15 190 lb 11.2 oz (86.501 kg)  11/15/15 128 lb (58.06 kg)  10/26/15 129 lb (58.514 kg)      Other studies Reviewed: Additional studies/ records that were reviewed today include:  Calcium score and DSE Review of the above records demonstrates:  Please see elsewhere in the note.     ASSESSMENT AND PLAN:  PALPITATIONS:  These are less symptomatic. No change in therapy is indicated.  DYSPNEA: I suspect this represents angina and is progressive.  (Class IV).  Therefore cardiac cath is indicated. The patient understands that risks included but are not limited to stroke (1 in 1000), death (1 in 56), kidney failure [usually temporary] (1 in 500), bleeding (1 in 200), allergic reaction [possibly serious] (1 in 200).  The patient understands and agrees to proceed.   DYSLIPIDEMIA:  Her LDL was recently 171. However, she stopped her statin.  I will try to convince her to restart this.  Current medicines are reviewed at length with the patient today.  The patient does not have concerns regarding medicines.  The following changes have been made:  no change  Labs/ tests ordered today include:  Cath    Disposition:   FU with me after cath.    Signed, Minus Breeding, MD  12/02/2015 9:21 AM    [name]

## 2015-12-06 NOTE — Interval H&P Note (Signed)
Cath Lab Visit (complete for each Cath Lab visit)  Clinical Evaluation Leading to the Procedure:   ACS: No.  Non-ACS:    Anginal Classification: CCS III  Anti-ischemic medical therapy: Maximal Therapy (2 or more classes of medications)  Non-Invasive Test Results: No non-invasive testing performed  Prior CABG: No previous CABG      History and Physical Interval Note:  12/06/2015 7:43 AM  Susan Bray  has presented today for surgery, with the diagnosis of Chest Pain  The various methods of treatment have been discussed with the patient and family. After consideration of risks, benefits and other options for treatment, the patient has consented to  Procedure(s): Left Heart Cath and Coronary Angiography (N/A) as a surgical intervention .  The patient's history has been reviewed, patient examined, no change in status, stable for surgery.  I have reviewed the patient's chart and labs.  Questions were answered to the patient's satisfaction.     Sinclair Grooms

## 2015-12-08 ENCOUNTER — Encounter: Payer: Self-pay | Admitting: Cardiology

## 2015-12-13 ENCOUNTER — Ambulatory Visit
Admission: RE | Admit: 2015-12-13 | Discharge: 2015-12-13 | Disposition: A | Discharge status: Home / Self Care | Payer: PPO | Source: Ambulatory Visit

## 2015-12-13 DIAGNOSIS — Z01411 Encounter for gynecological examination (general) (routine) with abnormal findings: Secondary | ICD-10-CM | POA: Diagnosis not present

## 2015-12-13 DIAGNOSIS — Z1231 Encounter for screening mammogram for malignant neoplasm of breast: Secondary | ICD-10-CM

## 2015-12-20 ENCOUNTER — Encounter: Payer: Self-pay | Admitting: Cardiology

## 2016-02-09 DIAGNOSIS — M25562 Pain in left knee: Secondary | ICD-10-CM | POA: Diagnosis not present

## 2016-02-21 DIAGNOSIS — E7800 Pure hypercholesterolemia, unspecified: Secondary | ICD-10-CM | POA: Diagnosis not present

## 2016-05-08 DIAGNOSIS — A601 Herpesviral infection of perianal skin and rectum: Secondary | ICD-10-CM | POA: Diagnosis not present

## 2016-05-08 DIAGNOSIS — K629 Disease of anus and rectum, unspecified: Secondary | ICD-10-CM | POA: Diagnosis not present

## 2016-05-08 DIAGNOSIS — B009 Herpesviral infection, unspecified: Secondary | ICD-10-CM | POA: Diagnosis not present

## 2016-05-08 DIAGNOSIS — R21 Rash and other nonspecific skin eruption: Secondary | ICD-10-CM | POA: Diagnosis not present

## 2016-05-15 DIAGNOSIS — B0089 Other herpesviral infection: Secondary | ICD-10-CM | POA: Diagnosis not present

## 2016-05-28 DIAGNOSIS — B009 Herpesviral infection, unspecified: Secondary | ICD-10-CM | POA: Diagnosis not present

## 2016-05-28 DIAGNOSIS — D899 Disorder involving the immune mechanism, unspecified: Secondary | ICD-10-CM | POA: Diagnosis not present

## 2016-07-27 DIAGNOSIS — Z23 Encounter for immunization: Secondary | ICD-10-CM | POA: Diagnosis not present

## 2016-08-10 DIAGNOSIS — L720 Epidermal cyst: Secondary | ICD-10-CM | POA: Diagnosis not present

## 2016-08-10 DIAGNOSIS — L821 Other seborrheic keratosis: Secondary | ICD-10-CM | POA: Diagnosis not present

## 2016-08-10 DIAGNOSIS — L570 Actinic keratosis: Secondary | ICD-10-CM | POA: Diagnosis not present

## 2016-08-29 DIAGNOSIS — H4921 Sixth [abducent] nerve palsy, right eye: Secondary | ICD-10-CM | POA: Diagnosis not present

## 2016-11-12 ENCOUNTER — Other Ambulatory Visit: Payer: Self-pay | Admitting: Family Medicine

## 2016-11-12 DIAGNOSIS — Z1231 Encounter for screening mammogram for malignant neoplasm of breast: Secondary | ICD-10-CM

## 2016-11-29 DIAGNOSIS — E7800 Pure hypercholesterolemia, unspecified: Secondary | ICD-10-CM | POA: Diagnosis not present

## 2016-11-29 DIAGNOSIS — Z0000 Encounter for general adult medical examination without abnormal findings: Secondary | ICD-10-CM | POA: Diagnosis not present

## 2016-11-29 DIAGNOSIS — Z1211 Encounter for screening for malignant neoplasm of colon: Secondary | ICD-10-CM | POA: Diagnosis not present

## 2016-11-29 DIAGNOSIS — N951 Menopausal and female climacteric states: Secondary | ICD-10-CM | POA: Diagnosis not present

## 2016-12-10 DIAGNOSIS — Z1211 Encounter for screening for malignant neoplasm of colon: Secondary | ICD-10-CM | POA: Diagnosis not present

## 2016-12-14 ENCOUNTER — Ambulatory Visit
Admission: RE | Admit: 2016-12-14 | Discharge: 2016-12-14 | Disposition: A | Discharge status: Home / Self Care | Payer: PPO | Source: Ambulatory Visit | Attending: Family Medicine | Admitting: Family Medicine

## 2016-12-14 DIAGNOSIS — Z1231 Encounter for screening mammogram for malignant neoplasm of breast: Secondary | ICD-10-CM | POA: Diagnosis not present

## 2016-12-17 ENCOUNTER — Other Ambulatory Visit: Payer: Self-pay | Admitting: Obstetrics and Gynecology

## 2016-12-17 ENCOUNTER — Other Ambulatory Visit (HOSPITAL_COMMUNITY)
Admission: RE | Admit: 2016-12-17 | Discharge: 2016-12-17 | Disposition: A | Discharge status: Home / Self Care | Payer: PPO | Source: Ambulatory Visit | Attending: Obstetrics and Gynecology | Admitting: Obstetrics and Gynecology

## 2016-12-17 DIAGNOSIS — Z1151 Encounter for screening for human papillomavirus (HPV): Secondary | ICD-10-CM | POA: Insufficient documentation

## 2016-12-17 DIAGNOSIS — Z01419 Encounter for gynecological examination (general) (routine) without abnormal findings: Secondary | ICD-10-CM | POA: Insufficient documentation

## 2016-12-18 DIAGNOSIS — Z86008 Personal history of in-situ neoplasm of other site: Secondary | ICD-10-CM | POA: Diagnosis not present

## 2016-12-18 DIAGNOSIS — Z9189 Other specified personal risk factors, not elsewhere classified: Secondary | ICD-10-CM | POA: Diagnosis not present

## 2016-12-19 LAB — CYTOLOGY - PAP
Diagnosis: NEGATIVE
HPV: NOT DETECTED

## 2017-01-10 DIAGNOSIS — Z780 Asymptomatic menopausal state: Secondary | ICD-10-CM | POA: Diagnosis not present

## 2017-01-10 DIAGNOSIS — M810 Age-related osteoporosis without current pathological fracture: Secondary | ICD-10-CM | POA: Diagnosis not present

## 2017-02-07 DIAGNOSIS — E7800 Pure hypercholesterolemia, unspecified: Secondary | ICD-10-CM | POA: Diagnosis not present

## 2017-04-04 IMAGING — CT CT HEART SCORING
2 series · 16 of 20 positions shown, 18 images · non-contrast
Comparison: None.

EXAM:
OVER-READ INTERPRETATION  CT CHEST

The following report is an over-read performed by radiologist Dr.
does not include interpretation of cardiac or coronary anatomy or
pathology. The coronary calcium score interpretation by the
cardiologist is attached.
TECHNIQUE: The patient was scanned on a Siemens Sensation 16 slice scanner.
Axial non-contrast 3mm slices were carried out through the heart.
The data set was analyzed on a dedicated work station and scored
using the Agatson method.

[Series 2: casc 3.0 i36f 2 bestdiast 69 % · axial · 0.32mm/px · z∈[-254,-170]mm · 8 of 36 slices shown, 10 images]
[im 4/36  vessel]
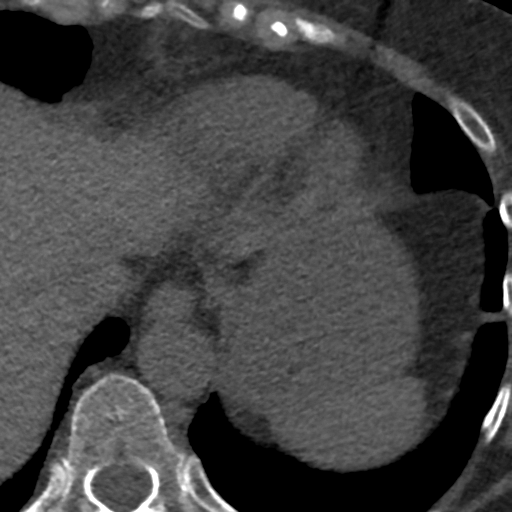
[im 4/36  lung]
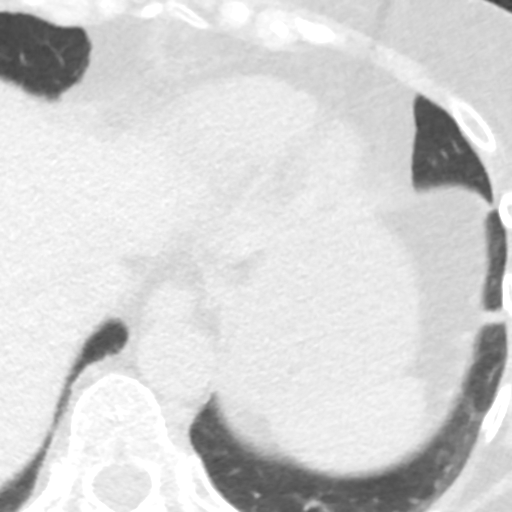
[im 8/36  vessel]
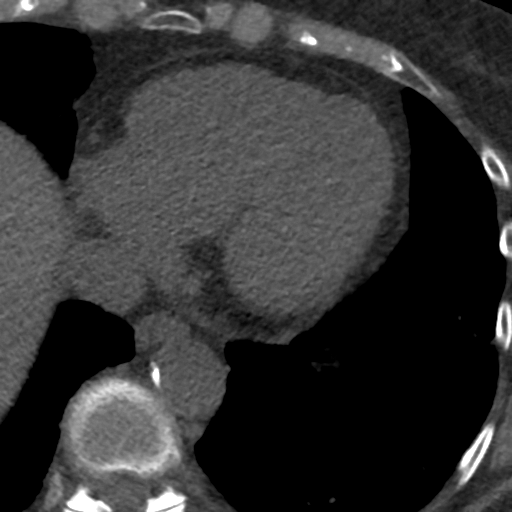
[im 12/36  vessel]
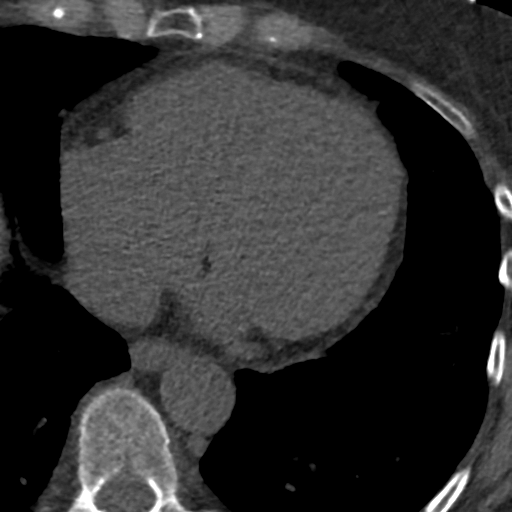
[im 16/36  vessel]
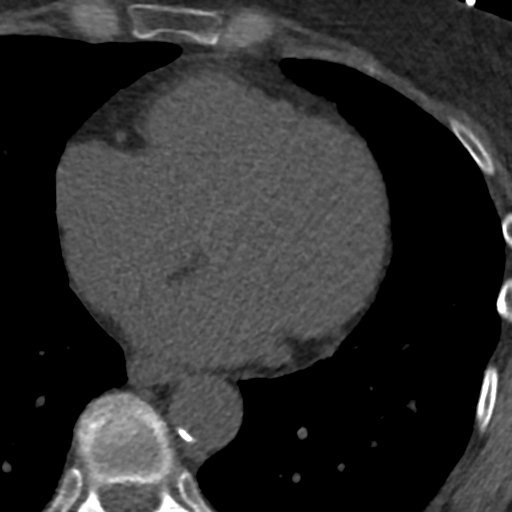
[im 20/36  vessel]
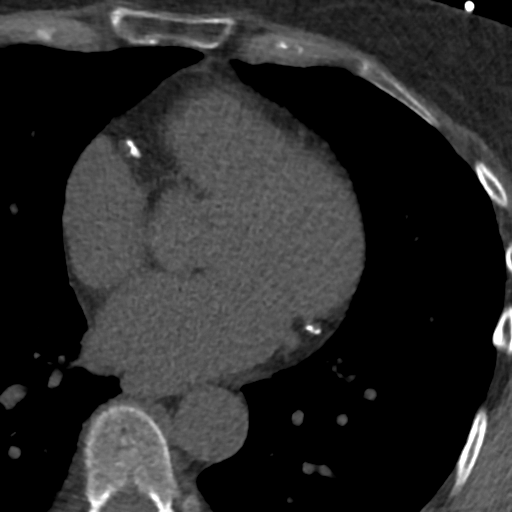
[im 20/36  lung]
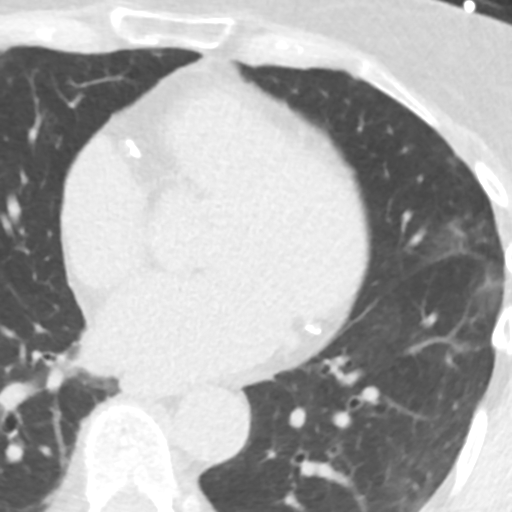
[im 24/36  vessel]
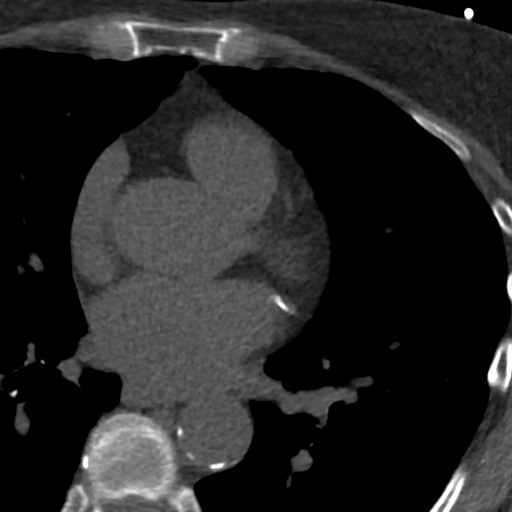
[im 28/36  vessel]
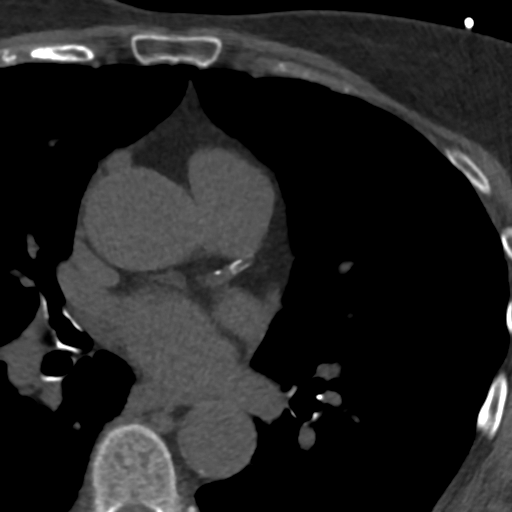
[im 32/36  vessel]
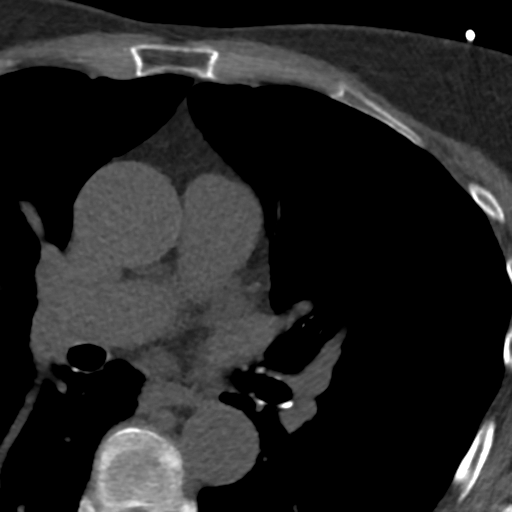

[Series 4: lung st 70 % · axial · 0.59mm/px · z∈[-254,-170]mm · 8 of 36 slices shown]
[im 4/36  lung]
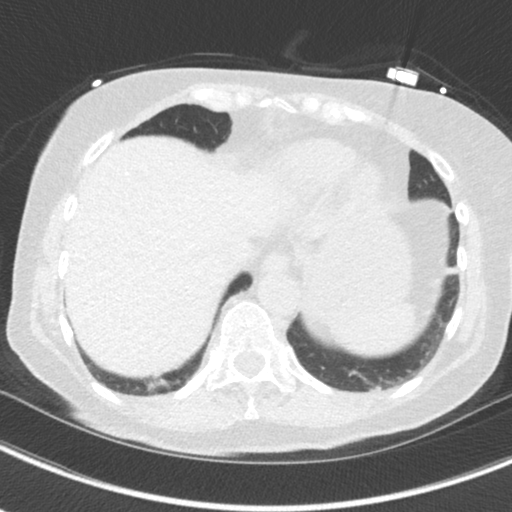
[im 8/36  lung]
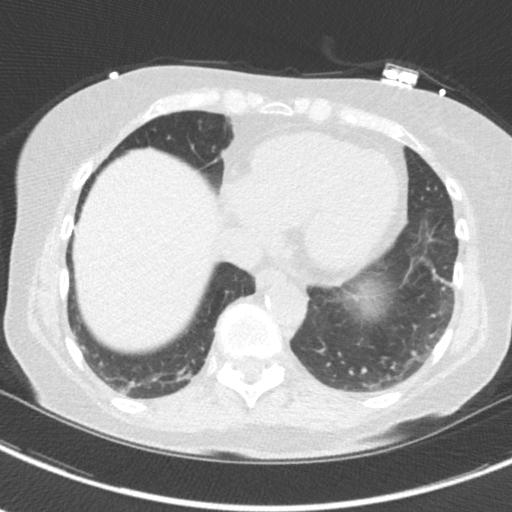
[im 12/36  lung]
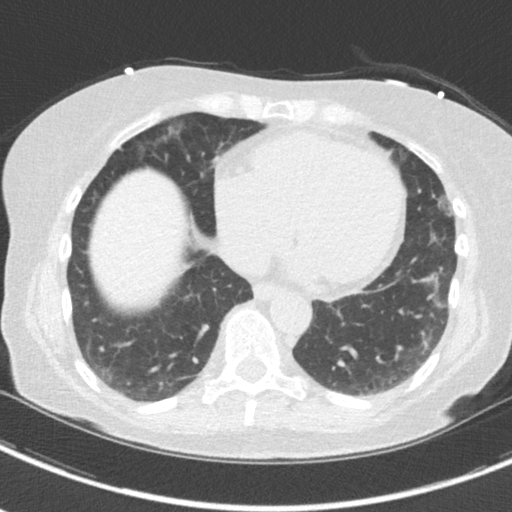
[im 16/36  lung]
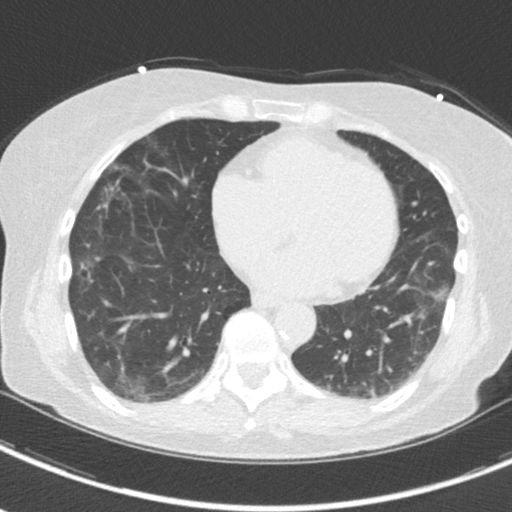
[im 20/36  lung]
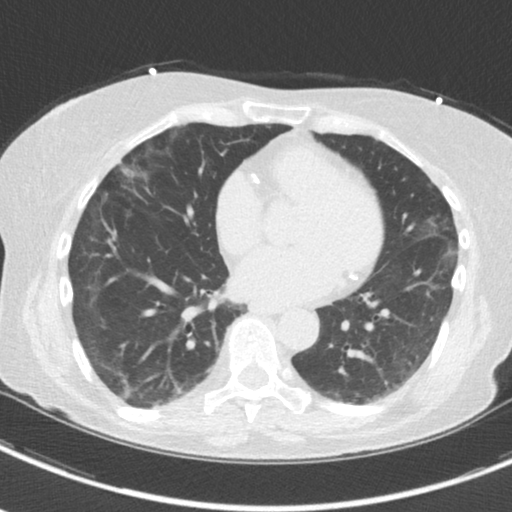
[im 24/36  lung]
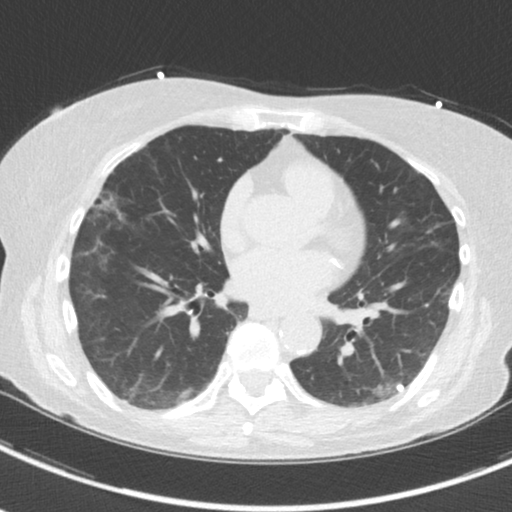
[im 28/36  lung]
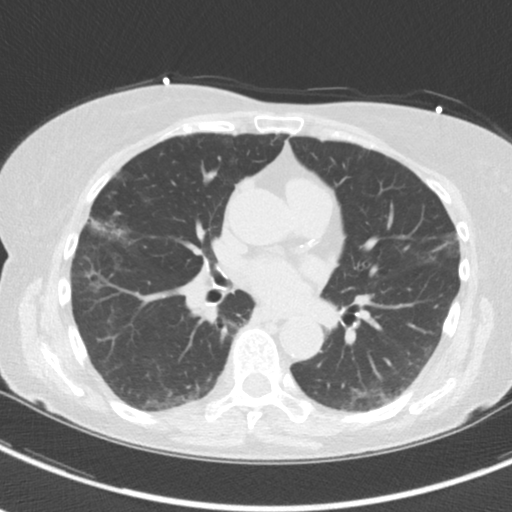
[im 32/36  lung]
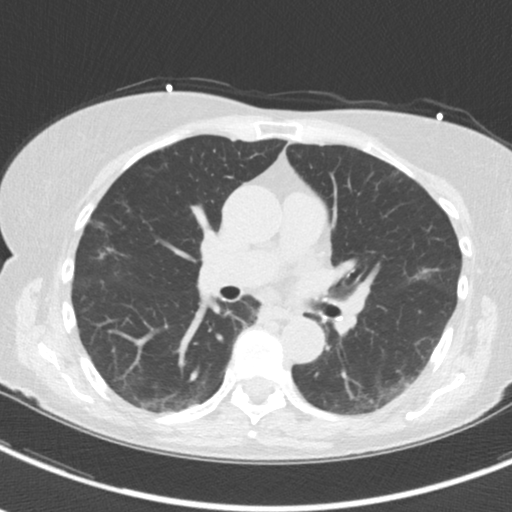

[16 of 20 positions shown; findings below may reference images not displayed]

FINDINGS: Visualized portions of lower lungs show subpleural interstitial
prominence, scarring, and possible early honeycombing. This is
suspicious for chronic interstitial lung disease, and may be due to
pulmonary fibrosis. No mass, airspace disease, or pleural effusion
seen in visualized portions of lungs. The visualized portions of the
mediastinum and chest wall are unremarkable.
IMPRESSION: Probable chronic subpleural interstitial lung disease. Pulmonary
interstitial fibrosis cannot be excluded. Consider pulmonology
consultation and high-resolution chest CT for further evaluation.

These results will be called to the ordering clinician or
representative by the Radiologist Assistant, and communication
documented in the PACS or zVision Dashboard.

:
Risk stratification

EXAM:
Coronary Calcium Score
FINDINGS: Non-cardiac: No significant non cardiac findings on limited lung and
soft tissue windows. See separate report from [REDACTED].

Ascending Aorta:  3.6 cm

Pericardium: Normal

Coronary arteries: Dense calcification in all 3 major epicardial
coronary arteries proximal and mid vessels
IMPRESSION: Coronary calcium score of 852. This was 96th percentile for age and
sex matched controls Consider f/u stress testing or functional study

Blain Jumper

## 2017-06-06 DIAGNOSIS — H4921 Sixth [abducent] nerve palsy, right eye: Secondary | ICD-10-CM | POA: Diagnosis not present

## 2017-07-04 DIAGNOSIS — M545 Low back pain: Secondary | ICD-10-CM | POA: Diagnosis not present

## 2017-07-04 DIAGNOSIS — R509 Fever, unspecified: Secondary | ICD-10-CM | POA: Diagnosis not present

## 2017-08-13 ENCOUNTER — Encounter: Payer: Self-pay | Admitting: Allergy and Immunology

## 2017-08-13 ENCOUNTER — Ambulatory Visit (INDEPENDENT_AMBULATORY_CARE_PROVIDER_SITE_OTHER): Payer: PPO | Admitting: Allergy and Immunology

## 2017-08-13 VITALS — BP 110/68 | HR 66 | Temp 98.1°F | Resp 14 | Ht 62.5 in | Wt 133.0 lb

## 2017-08-13 DIAGNOSIS — R05 Cough: Secondary | ICD-10-CM | POA: Diagnosis not present

## 2017-08-13 DIAGNOSIS — L299 Pruritus, unspecified: Secondary | ICD-10-CM | POA: Diagnosis not present

## 2017-08-13 DIAGNOSIS — J310 Chronic rhinitis: Secondary | ICD-10-CM | POA: Diagnosis not present

## 2017-08-13 DIAGNOSIS — L500 Allergic urticaria: Secondary | ICD-10-CM | POA: Diagnosis not present

## 2017-08-13 DIAGNOSIS — R053 Chronic cough: Secondary | ICD-10-CM

## 2017-08-13 DIAGNOSIS — K219 Gastro-esophageal reflux disease without esophagitis: Secondary | ICD-10-CM | POA: Diagnosis not present

## 2017-08-13 MED ORDER — AZELASTINE HCL 0.15 % NA SOLN: NASAL | 5 refills | Status: DC

## 2017-08-13 MED ORDER — FEXOFENADINE HCL 60 MG PO TABS: 60.0000 mg | ORAL_TABLET | Freq: Two times a day (BID) | ORAL | 5 refills | Status: DC

## 2017-08-13 MED ORDER — RANITIDINE HCL 150 MG PO CAPS: 150.0000 mg | ORAL_CAPSULE | Freq: Two times a day (BID) | ORAL | 5 refills | Status: DC

## 2017-08-13 NOTE — Assessment & Plan Note (Signed)
Unclear etiology. Skin tests to select food allergens were negative today. NSAIDs and emotional stress commonly exacerbate urticaria but are not the underlying etiology in this case. Physical urticarias are negative by history (i.e. pressure-induced, temperature, vibration, solar, etc.). History and lesions are not consistent with urticaria pigmentosa so I am not suspicious for mastocytosis. There are no concomitant symptoms concerning for anaphylaxis or constitutional symptoms worrisome for an underlying malignancy. We will rule out other potential etiologies with labs. For symptom relief, patient is to take oral antihistamines as directed.  The following labs have been ordered: FCeRI antibody, TSH, anti-thyroglobulin antibody, thyroid peroxidase antibody, tryptase, urea breath test, CBC, CMP, ESR, ANA, and galactose-alpha-1,3-galactose IgE level.  The patient will be called with further recommendations after lab results have returned.  Fexofenadine (Allegra) 60 mg 1 or 2 times daily as needed +/- ranitidine (Zantac) 150 mg 1 or 2 times daily as needed.  Instructions have been discussed and provided for H1/H2 receptor blockade with titration to find lowest effective dose.  Should there be a significant increase or change in symptoms, a journal is to be kept recording any foods eaten, beverages consumed, medications taken within a 6 hour period prior to the onset of symptoms, as well as record activities being performed, and environmental conditions. For any symptoms concerning for anaphylaxis, 911 is to be called immediately.

## 2017-08-13 NOTE — Progress Notes (Signed)
New Patient Note  RE: Susan Bray MRN: [id number] DOB: 10-03-1946 Date of Office Visit: 08/13/2017  Referring provider: Alroy Dust, L.Marlou Sa, MD Primary care provider: Alroy Dust, Carlean Jews.Marlou Sa, MD  Chief Complaint: Pruritis; Cough; and Nasal Congestion   History of present illness: Susan Bray is a 71 y.o. female seen today in consultation requested by.Donnie Coffin, MD.  She reports that over the past year she has experienced itching over different parts of her body which occur "randomly."  At times, the itching is severe enough to awaken her from sleep at nighttime.  She denies rash, vesicles, or angioedema. She denies concomitant cardiopulmonary symptoms or GI symptoms.  No specific medication, food, skin care product, detergent, soap, or other environmental triggers have been identified.  She attempts to control the itching with diphenhydramine as needed. Susan Bray experiences nasal congestion, especially at nighttime, postnasal drainage, and occasional sinus pressure over the forehead and cheekbones.  The symptoms occur year around but may be more pronounced during the fall.  She attempts to control the symptoms with saline spray.  She complains of a persistent cough which has occurred over the past 2 months.  The cough occurs throughout the day and is described as dry/nonproductive.  Cough seems to originate in the base of the throat or upper chest.  She admits to frequent heartburn which she attempts to control by drinking milk.   Assessment and plan: Cough, persistent The most common causes of chronic cough include the following: upper airway cough syndrome (UACS) which is caused by variety of rhinosinus conditions; asthma; gastroesophageal reflux disease (GERD); chronic bronchitis from cigarette smoking or other inhaled environmental irritants; non-asthmatic eosinophilic bronchitis; and bronchiectasis. In prospective studies, these conditions have accounted for up to 94% of the causes of chronic cough  in immunocompetent adults. The history and physical examination suggest that her cough is multifactorial with contribution from acid reflux and possibly post nasal drainage. We will address these issues at this time.   A prescription has been provided for a flutter valve to be used as needed to break the coughing cycle.  Treatment plan as outlined below.   GERD (gastroesophageal reflux disease)  Appropriate reflux lifestyle modifications have been provided and discussed.  A prescription has been provided for ranitidine 150 mg twice daily.  Chronic rhinitis All seasonal and perennial aeroallergen skin tests are negative despite a positive histamine control.  Intranasal steroids, intranasal antihistamines, and first generation antihistamines are effective for symptoms associated with non-allergic rhinitis, whereas second generation antihistamines such as cetirizine (Zyrtec), loratadine (Claritin) and fexofenadine (Allegra) have been found to be ineffective for this condition.  A prescription has been provided for azelastine nasal spray, one spray per nostril 1-2 times daily as needed. Proper nasal spray technique has been discussed and demonstrated.  Nasal saline lavage (NeilMed) has been recommended as needed and prior to medicated nasal sprays along with instructions for proper administration.  For thick post nasal drainage, add guaifenesin 600 mg (Mucinex)  twice daily as needed with adequate hydration as discussed.  Pruritus Unclear etiology. Skin tests to select food allergens were negative today. NSAIDs and emotional stress commonly exacerbate urticaria but are not the underlying etiology in this case. Physical urticarias are negative by history (i.e. pressure-induced, temperature, vibration, solar, etc.). History and lesions are not consistent with urticaria pigmentosa so I am not suspicious for mastocytosis. There are no concomitant symptoms concerning for anaphylaxis or constitutional  symptoms worrisome for an underlying malignancy. We will rule out other potential etiologies with labs.  For symptom relief, patient is to take oral antihistamines as directed.  The following labs have been ordered: FCeRI antibody, TSH, anti-thyroglobulin antibody, thyroid peroxidase antibody, tryptase, urea breath test, CBC, CMP, ESR, ANA, and galactose-alpha-1,3-galactose IgE level.  The patient will be called with further recommendations after lab results have returned.  Fexofenadine (Allegra) 60 mg 1 or 2 times daily as needed +/- ranitidine (Zantac) 150 mg 1 or 2 times daily as needed.  Instructions have been discussed and provided for H1/H2 receptor blockade with titration to find lowest effective dose.  Should there be a significant increase or change in symptoms, a journal is to be kept recording any foods eaten, beverages consumed, medications taken within a 6 hour period prior to the onset of symptoms, as well as record activities being performed, and environmental conditions. For any symptoms concerning for anaphylaxis, 911 is to be called immediately.   Meds ordered this encounter  Medications  . fexofenadine (ALLEGRA ALLERGY) 60 MG tablet    Sig: Take 1 tablet (60 mg total) by mouth 2 (two) times daily.    Dispense:  60 tablet    Refill:  5  . ranitidine (ZANTAC) 150 MG capsule    Sig: Take 1 capsule (150 mg total) by mouth 2 (two) times daily.    Dispense:  60 capsule    Refill:  5  . Azelastine HCl 0.15 % SOLN    Sig: 1 spray in each nostril 1-2 times daily as needed.    Dispense:  30 mL    Refill:  5    Diagnostics: Spirometry:  Normal with an FEV1 of 92% predicted.  Please see scanned spirometry results for details. Environmental skin testing: Negative despite a positive histamine control. Food allergen skin testing: Negative despite a positive histamine control.    Physical examination: Blood pressure 110/68, pulse 66, temperature 98.1 F (36.7 C), temperature  source Oral, resp. rate 14, height 5' 2.5" (1.588 m), weight 133 lb (60.3 kg), SpO2 98 %.  General: Alert, interactive, in no acute distress. HEENT: TMs pearly gray, turbinates mildly edematous with clear discharge, post-pharynx mildly erythematous. Neck: Supple without lymphadenopathy. Lungs: Clear to auscultation without wheezing, rhonchi or rales. CV: Normal S1, S2 without murmurs. Abdomen: Nondistended, nontender. Skin: Warm and dry, without lesions or rashes. Extremities:  No clubbing, cyanosis or edema. Neuro:   Grossly intact.  Review of systems:  Review of systems negative except as noted in HPI / PMHx or noted below: Review of Systems  Constitutional: Negative.   HENT: Negative.   Eyes: Negative.   Respiratory: Negative.   Cardiovascular: Negative.   Gastrointestinal: Negative.   Genitourinary: Negative.   Musculoskeletal: Negative.   Skin: Negative.   Neurological: Negative.   Endo/Heme/Allergies: Negative.   Psychiatric/Behavioral: Negative.     Past medical history:  Past Medical History:  Diagnosis Date  . Cataract   . Hyperlipidemia   . IBS (irritable bowel syndrome)   . MVP (mitral valve prolapse)   . VIN III (vulvar intraepithelial neoplasia III)     Past surgical history:  Past Surgical History:  Procedure Laterality Date  . ABDOMINAL HYSTERECTOMY  1978  . CARDIAC CATHETERIZATION N/A 12/06/2015   Procedure: Left Heart Cath and Coronary Angiography;  Surgeon: Belva Crome, MD;  Location: [facility];  Service: Cardiovascular;  Laterality: N/A;  . CATARACT EXTRACTION  08/17/04 ; 09/12/2004   left eye and right eye  . CATARACT EXTRACTION    . laser vaporization of hg vaginal  dysplasia  12/2006  . p & e repairs with cardinal uterosatral coloposusension (pelvic area repair)  03/03/09  . severe dysplasia near rectum  03/18/2013  . tummy tuck  1982  . upper colpectomy  09/1993    Family history: Family History  Problem Relation Age of Onset    . Arthritis Mother   . Cancer Mother        breast  . Hypertension Mother   . Heart attack Mother 1  . Heart attack Father 93  . Heart disease Maternal Grandmother   . CAD Brother 54  . Peripheral vascular disease Sister 71    Social history: Social History   Social History  . Marital status: Married    Spouse name: N/A  . Number of children: 3  . Years of education: N/A   Occupational History  . Not on file.   Social History Main Topics  . Smoking status: Never Smoker  . Smokeless tobacco: Never Used  . Alcohol use No  . Drug use: No  . Sexual activity: Not on file   Other Topics Concern  . Not on file   Social History Narrative   Married   Retired   3 pregnancies   3 live births   8-9 hours of sleep a night    Lives with son and two grandchildren   Some college.    Environmental History: The patient lives in a 69 year old house with ligament floors throughout and central air/heat.  There are dogs in the home which do not have access to her bedroom.  She is a non-smoker.  There is no known mold/water damage in the home.  Allergies as of 08/13/2017   No Known Allergies     Medication List       Accurate as of 08/13/17  5:05 PM. Always use your most recent med list.          Azelastine HCl 0.15 % Soln 1 spray in each nostril 1-2 times daily as needed.   fexofenadine 60 MG tablet Commonly known as:  ALLEGRA ALLERGY Take 1 tablet (60 mg total) by mouth 2 (two) times daily.   ranitidine 150 MG capsule Commonly known as:  ZANTAC Take 1 capsule (150 mg total) by mouth 2 (two) times daily.       Known medication allergies: No Known Allergies  I appreciate the opportunity to take part in Susan Bray's care. Please do not hesitate to contact me with questions.  Sincerely,   R. Edgar Frisk, MD

## 2017-08-13 NOTE — Assessment & Plan Note (Signed)
   Appropriate reflux lifestyle modifications have been provided and discussed.  A prescription has been provided for ranitidine 150 mg twice daily.

## 2017-08-13 NOTE — Assessment & Plan Note (Signed)
All seasonal and perennial aeroallergen skin tests are negative despite a positive histamine control.  Intranasal steroids, intranasal antihistamines, and first generation antihistamines are effective for symptoms associated with non-allergic rhinitis, whereas second generation antihistamines such as cetirizine (Zyrtec), loratadine (Claritin) and fexofenadine (Allegra) have been found to be ineffective for this condition.  A prescription has been provided for azelastine nasal spray, one spray per nostril 1-2 times daily as needed. Proper nasal spray technique has been discussed and demonstrated.  Nasal saline lavage (NeilMed) has been recommended as needed and prior to medicated nasal sprays along with instructions for proper administration.  For thick post nasal drainage, add guaifenesin 600 mg (Mucinex)  twice daily as needed with adequate hydration as discussed. 

## 2017-08-13 NOTE — Patient Instructions (Addendum)
Cough, persistent The most common causes of chronic cough include the following: upper airway cough syndrome (UACS) which is caused by variety of rhinosinus conditions; asthma; gastroesophageal reflux disease (GERD); chronic bronchitis from cigarette smoking or other inhaled environmental irritants; non-asthmatic eosinophilic bronchitis; and bronchiectasis. In prospective studies, these conditions have accounted for up to 94% of the causes of chronic cough in immunocompetent adults. The history and physical examination suggest that her cough is multifactorial with contribution from acid reflux and possibly post nasal drainage. We will address these issues at this time.   A prescription has been provided for a flutter valve to be used as needed to break the coughing cycle.  Treatment plan as outlined below.   GERD (gastroesophageal reflux disease)  Appropriate reflux lifestyle modifications have been provided and discussed.  A prescription has been provided for ranitidine 150 mg twice daily.  Chronic rhinitis All seasonal and perennial aeroallergen skin tests are negative despite a positive histamine control.  Intranasal steroids, intranasal antihistamines, and first generation antihistamines are effective for symptoms associated with non-allergic rhinitis, whereas second generation antihistamines such as cetirizine (Zyrtec), loratadine (Claritin) and fexofenadine (Allegra) have been found to be ineffective for this condition.  A prescription has been provided for azelastine nasal spray, one spray per nostril 1-2 times daily as needed. Proper nasal spray technique has been discussed and demonstrated.  Nasal saline lavage (NeilMed) has been recommended as needed and prior to medicated nasal sprays along with instructions for proper administration.  For thick post nasal drainage, add guaifenesin 600 mg (Mucinex)  twice daily as needed with adequate hydration as discussed.  Pruritus Unclear  etiology. Skin tests to select food allergens were negative today. NSAIDs and emotional stress commonly exacerbate urticaria but are not the underlying etiology in this case. Physical urticarias are negative by history (i.e. pressure-induced, temperature, vibration, solar, etc.). History and lesions are not consistent with urticaria pigmentosa so I am not suspicious for mastocytosis. There are no concomitant symptoms concerning for anaphylaxis or constitutional symptoms worrisome for an underlying malignancy. We will rule out other potential etiologies with labs. For symptom relief, patient is to take oral antihistamines as directed.  The following labs have been ordered: FCeRI antibody, TSH, anti-thyroglobulin antibody, thyroid peroxidase antibody, tryptase, urea breath test, CBC, CMP, ESR, ANA, and galactose-alpha-1,3-galactose IgE level.  The patient will be called with further recommendations after lab results have returned.  Fexofenadine (Allegra) 60 mg 1 or 2 times daily as needed +/- ranitidine (Zantac) 150 mg 1 or 2 times daily as needed.  Instructions have been discussed and provided for H1/H2 receptor blockade with titration to find lowest effective dose.  Should there be a significant increase or change in symptoms, a journal is to be kept recording any foods eaten, beverages consumed, medications taken within a 6 hour period prior to the onset of symptoms, as well as record activities being performed, and environmental conditions. For any symptoms concerning for anaphylaxis, 911 is to be called immediately.  When lab results have returned the patient will be called with further recommendations and follow up instructions.

## 2017-08-13 NOTE — Assessment & Plan Note (Signed)
The most common causes of chronic cough include the following: upper airway cough syndrome (UACS) which is caused by variety of rhinosinus conditions; asthma; gastroesophageal reflux disease (GERD); chronic bronchitis from cigarette smoking or other inhaled environmental irritants; non-asthmatic eosinophilic bronchitis; and bronchiectasis. In prospective studies, these conditions have accounted for up to 94% of the causes of chronic cough in immunocompetent adults. The history and physical examination suggest that her cough is multifactorial with contribution from acid reflux and possibly post nasal drainage. We will address these issues at this time.   A prescription has been provided for a flutter valve to be used as needed to break the coughing cycle.  Treatment plan as outlined below.

## 2017-08-14 ENCOUNTER — Other Ambulatory Visit: Payer: Self-pay

## 2017-08-15 ENCOUNTER — Encounter: Payer: Self-pay | Admitting: Allergy and Immunology

## 2017-08-15 DIAGNOSIS — Z23 Encounter for immunization: Secondary | ICD-10-CM | POA: Diagnosis not present

## 2017-08-19 ENCOUNTER — Other Ambulatory Visit: Payer: Self-pay

## 2017-08-19 DIAGNOSIS — L500 Allergic urticaria: Secondary | ICD-10-CM

## 2017-08-19 DIAGNOSIS — L299 Pruritus, unspecified: Secondary | ICD-10-CM

## 2017-08-19 MED ORDER — FEXOFENADINE HCL 60 MG PO TABS: 60.0000 mg | ORAL_TABLET | Freq: Two times a day (BID) | ORAL | 5 refills | Status: DC

## 2017-08-20 LAB — COMPREHENSIVE METABOLIC PANEL
ALT: 9 IU/L (ref 0–32)
AST: 16 IU/L (ref 0–40)
Albumin/Globulin Ratio: 1.5 (ref 1.2–2.2)
Albumin: 4.3 g/dL (ref 3.6–4.8)
Alkaline Phosphatase: 61 IU/L (ref 39–117)
BUN/Creatinine Ratio: 14 (ref 12–28)
BUN: 11 mg/dL (ref 8–27)
Bilirubin Total: 0.3 mg/dL (ref 0.0–1.2)
CO2: 22 mmol/L (ref 20–29)
Calcium: 9.3 mg/dL (ref 8.7–10.3)
Chloride: 102 mmol/L (ref 96–106)
Creatinine, Ser: 0.76 mg/dL (ref 0.57–1.00)
GFR calc Af Amer: 93 mL/min/{1.73_m2} (ref >59)
GFR calc non Af Amer: 80 mL/min/{1.73_m2} (ref >59)
Globulin, Total: 2.9 g/dL (ref 1.5–4.5)
Glucose: 89 mg/dL (ref 65–99)
Potassium: 4.2 mmol/L (ref 3.5–5.2)
Sodium: 142 mmol/L (ref 134–144)
Total Protein: 7.2 g/dL (ref 6.0–8.5)

## 2017-08-20 LAB — CBC WITH DIFFERENTIAL/PLATELET
Basophils Absolute: 0.1 10*3/uL (ref 0.0–0.2)
Basos: 1 %
EOS (ABSOLUTE): 0.3 10*3/uL (ref 0.0–0.4)
Eos: 5 %
Hematocrit: 38.4 % (ref 34.0–46.6)
Hemoglobin: 12.2 g/dL (ref 11.1–15.9)
Immature Grans (Abs): 0 10*3/uL (ref 0.0–0.1)
Immature Granulocytes: 0 %
Lymphocytes Absolute: 2.1 10*3/uL (ref 0.7–3.1)
Lymphs: 42 %
MCH: 27.5 pg (ref 26.6–33.0)
MCHC: 31.8 g/dL (ref 31.5–35.7)
MCV: 87 fL (ref 79–97)
Monocytes Absolute: 0.4 10*3/uL (ref 0.1–0.9)
Monocytes: 8 %
Neutrophils Absolute: 2.2 10*3/uL (ref 1.4–7.0)
Neutrophils: 44 %
Platelets: 243 10*3/uL (ref 150–379)
RBC: 4.44 x10E6/uL (ref 3.77–5.28)
RDW: 15.1 % (ref 12.3–15.4)
WBC: 5 10*3/uL (ref 3.4–10.8)

## 2017-08-20 LAB — ALPHA-GAL PANEL
Alpha Gal IgE*: <0.1 kU/L (ref <0.10)
Beef (Bos spp) IgE: <0.1 kU/L (ref <0.35)
Class Interpretation: 0
Class Interpretation: 0
Class Interpretation: 0
Lamb/Mutton (Ovis spp) IgE: <0.1 kU/L (ref <0.35)
Pork (Sus spp) IgE: <0.1 kU/L (ref <0.35)

## 2017-08-20 LAB — SEDIMENTATION RATE: Sed Rate: 21 mm/hr (ref 0–40)

## 2017-08-20 LAB — ANA W/REFLEX IF POSITIVE: Anti Nuclear Antibody(ANA): NEGATIVE

## 2017-08-20 LAB — TRYPTASE: Tryptase: 4.8 ug/L (ref 2.2–13.2)

## 2017-08-20 LAB — CHRONIC URTICARIA: cu index: <1.8 (ref <10)

## 2017-08-20 LAB — H. PYLORI BREATH TEST: H pylori Breath Test: NEGATIVE

## 2017-08-21 MED ORDER — FLUTTER DEVI: 0 refills | Status: DC

## 2017-08-21 NOTE — Addendum Note (Signed)
Addended by: Katherina Right D on: 08/21/2017 11:18 AM   Modules accepted: Orders

## 2017-08-21 NOTE — Addendum Note (Signed)
Addended by: Katherina Right D on: 08/21/2017 10:55 AM   Modules accepted: Orders

## 2017-08-21 NOTE — Addendum Note (Signed)
Addended by: Katherina Right D on: 08/21/2017 11:17 AM   Modules accepted: Orders

## 2017-08-23 DIAGNOSIS — L500 Allergic urticaria: Secondary | ICD-10-CM | POA: Diagnosis not present

## 2017-08-23 DIAGNOSIS — L299 Pruritus, unspecified: Secondary | ICD-10-CM | POA: Diagnosis not present

## 2017-08-28 ENCOUNTER — Telehealth: Payer: Self-pay | Admitting: *Deleted

## 2017-08-28 NOTE — Telephone Encounter (Signed)
-----   Message from Adelina Mings, MD sent at 08/26/2017  6:15 PM EST ----- The TSH is elevated, indicating hypothyroidism (underactive thyroid function). In addition, thryoperoxidase antibody is elevated indicating an autoimmune thyroid disorder. Her PCP or endocrinologist will need to follow this and adjust thyroid replacement medications. Re-adjusting the thyroid levels may relieve the itching. Continue antihistamines as needed. Please send results to the pt's PCP. Thanks.

## 2017-08-28 NOTE — Telephone Encounter (Signed)
Noted  

## 2017-08-28 NOTE — Telephone Encounter (Signed)
Spoke to Dr Buddy Duty office They faxed a referral form Faxed referral form and notes to their office They will contact patient for an appt Spoke to the patient to inform her that the referral was sent and that Dr Buddy Duty office will be contacting her

## 2017-08-28 NOTE — Telephone Encounter (Signed)
Please schedule for Dr Buddy Duty endocrinology for hypothyroidism. Susan Bray please schedule

## 2017-08-29 ENCOUNTER — Telehealth: Payer: Self-pay | Admitting: Allergy and Immunology

## 2017-08-29 ENCOUNTER — Ambulatory Visit
Admission: RE | Admit: 2017-08-29 | Discharge: 2017-08-29 | Disposition: A | Discharge status: Home / Self Care | Payer: PPO | Source: Ambulatory Visit | Attending: Family Medicine | Admitting: Family Medicine

## 2017-08-29 ENCOUNTER — Other Ambulatory Visit: Payer: Self-pay | Admitting: Family Medicine

## 2017-08-29 DIAGNOSIS — R05 Cough: Secondary | ICD-10-CM | POA: Diagnosis not present

## 2017-08-29 DIAGNOSIS — R059 Cough, unspecified: Secondary | ICD-10-CM

## 2017-08-29 DIAGNOSIS — E039 Hypothyroidism, unspecified: Secondary | ICD-10-CM | POA: Diagnosis not present

## 2017-08-29 NOTE — Telephone Encounter (Signed)
Patient would like her thyroid results mailed to her PCP.

## 2017-08-29 NOTE — Telephone Encounter (Signed)
Results routed to pcp.

## 2017-09-04 ENCOUNTER — Telehealth: Payer: Self-pay | Admitting: Allergy and Immunology

## 2017-09-04 DIAGNOSIS — M79603 Pain in arm, unspecified: Secondary | ICD-10-CM | POA: Diagnosis not present

## 2017-09-04 DIAGNOSIS — E7800 Pure hypercholesterolemia, unspecified: Secondary | ICD-10-CM | POA: Diagnosis not present

## 2017-09-04 DIAGNOSIS — J309 Allergic rhinitis, unspecified: Secondary | ICD-10-CM | POA: Diagnosis not present

## 2017-09-04 LAB — THYROGLOBULIN LEVEL: Thyroglobulin (TG-RIA): 88 ng/mL — ABNORMAL HIGH

## 2017-09-04 LAB — THYROID PEROXIDASE ANTIBODY: Thyroperoxidase Ab SerPl-aCnc: 210 IU/mL — ABNORMAL HIGH (ref 0–34)

## 2017-09-04 LAB — TSH: TSH: 7.38 u[IU]/mL — ABNORMAL HIGH (ref 0.450–4.500)

## 2017-09-04 NOTE — Telephone Encounter (Signed)
Per patient's request results sent to PCP/Referring.

## 2017-09-04 NOTE — Telephone Encounter (Signed)
Patient received a message on My Chart telling her she had a new lab result that came in. She would like all test results sent to Dr. Donnie Coffin.

## 2017-10-29 DIAGNOSIS — E039 Hypothyroidism, unspecified: Secondary | ICD-10-CM | POA: Diagnosis not present

## 2017-10-29 DIAGNOSIS — M791 Myalgia, unspecified site: Secondary | ICD-10-CM | POA: Diagnosis not present

## 2017-10-29 DIAGNOSIS — E7800 Pure hypercholesterolemia, unspecified: Secondary | ICD-10-CM | POA: Diagnosis not present

## 2017-12-19 ENCOUNTER — Other Ambulatory Visit: Payer: Self-pay | Admitting: Obstetrics and Gynecology

## 2017-12-19 ENCOUNTER — Other Ambulatory Visit (HOSPITAL_COMMUNITY)
Admission: RE | Admit: 2017-12-19 | Discharge: 2017-12-19 | Disposition: A | Discharge status: Home / Self Care | Payer: PPO | Source: Ambulatory Visit | Attending: Obstetrics and Gynecology | Admitting: Obstetrics and Gynecology

## 2017-12-19 DIAGNOSIS — E039 Hypothyroidism, unspecified: Secondary | ICD-10-CM | POA: Diagnosis not present

## 2017-12-19 DIAGNOSIS — M810 Age-related osteoporosis without current pathological fracture: Secondary | ICD-10-CM | POA: Diagnosis not present

## 2017-12-19 DIAGNOSIS — Z1211 Encounter for screening for malignant neoplasm of colon: Secondary | ICD-10-CM | POA: Diagnosis not present

## 2017-12-19 DIAGNOSIS — Z124 Encounter for screening for malignant neoplasm of cervix: Secondary | ICD-10-CM | POA: Diagnosis not present

## 2017-12-19 DIAGNOSIS — Z0000 Encounter for general adult medical examination without abnormal findings: Secondary | ICD-10-CM | POA: Diagnosis not present

## 2017-12-19 DIAGNOSIS — N893 Dysplasia of vagina, unspecified: Secondary | ICD-10-CM | POA: Diagnosis not present

## 2017-12-19 DIAGNOSIS — Z9189 Other specified personal risk factors, not elsewhere classified: Secondary | ICD-10-CM | POA: Diagnosis not present

## 2017-12-19 DIAGNOSIS — E7800 Pure hypercholesterolemia, unspecified: Secondary | ICD-10-CM | POA: Diagnosis not present

## 2017-12-19 DIAGNOSIS — J309 Allergic rhinitis, unspecified: Secondary | ICD-10-CM | POA: Diagnosis not present

## 2017-12-19 DIAGNOSIS — M79603 Pain in arm, unspecified: Secondary | ICD-10-CM | POA: Diagnosis not present

## 2017-12-19 DIAGNOSIS — Z1231 Encounter for screening mammogram for malignant neoplasm of breast: Secondary | ICD-10-CM

## 2017-12-23 LAB — CYTOLOGY - PAP
DIAGNOSIS: NEGATIVE
HPV: NOT DETECTED

## 2017-12-25 ENCOUNTER — Other Ambulatory Visit: Payer: Self-pay | Admitting: Family Medicine

## 2017-12-25 ENCOUNTER — Ambulatory Visit
Admission: RE | Admit: 2017-12-25 | Discharge: 2017-12-25 | Disposition: A | Discharge status: Home / Self Care | Payer: PPO | Source: Ambulatory Visit | Attending: Family Medicine | Admitting: Family Medicine

## 2017-12-25 DIAGNOSIS — M79601 Pain in right arm: Secondary | ICD-10-CM

## 2017-12-25 DIAGNOSIS — M19011 Primary osteoarthritis, right shoulder: Secondary | ICD-10-CM | POA: Diagnosis not present

## 2018-01-15 ENCOUNTER — Ambulatory Visit
Admission: RE | Admit: 2018-01-15 | Discharge: 2018-01-15 | Disposition: A | Discharge status: Home / Self Care | Payer: PPO | Source: Ambulatory Visit | Attending: Obstetrics and Gynecology | Admitting: Obstetrics and Gynecology

## 2018-01-15 DIAGNOSIS — Z1231 Encounter for screening mammogram for malignant neoplasm of breast: Secondary | ICD-10-CM | POA: Diagnosis not present

## 2018-01-16 DIAGNOSIS — Z1211 Encounter for screening for malignant neoplasm of colon: Secondary | ICD-10-CM | POA: Diagnosis not present

## 2018-01-16 DIAGNOSIS — Z1212 Encounter for screening for malignant neoplasm of rectum: Secondary | ICD-10-CM | POA: Diagnosis not present

## 2018-02-25 DIAGNOSIS — E039 Hypothyroidism, unspecified: Secondary | ICD-10-CM | POA: Diagnosis not present

## 2018-02-25 DIAGNOSIS — E7800 Pure hypercholesterolemia, unspecified: Secondary | ICD-10-CM | POA: Diagnosis not present

## 2018-03-28 DIAGNOSIS — G2581 Restless legs syndrome: Secondary | ICD-10-CM | POA: Diagnosis not present

## 2018-03-28 DIAGNOSIS — E039 Hypothyroidism, unspecified: Secondary | ICD-10-CM | POA: Diagnosis not present

## 2018-03-28 DIAGNOSIS — R5383 Other fatigue: Secondary | ICD-10-CM | POA: Diagnosis not present

## 2018-05-02 DIAGNOSIS — E039 Hypothyroidism, unspecified: Secondary | ICD-10-CM | POA: Diagnosis not present

## 2018-07-17 DIAGNOSIS — M545 Low back pain: Secondary | ICD-10-CM | POA: Diagnosis not present

## 2018-07-17 DIAGNOSIS — E7800 Pure hypercholesterolemia, unspecified: Secondary | ICD-10-CM | POA: Diagnosis not present

## 2018-07-17 DIAGNOSIS — E039 Hypothyroidism, unspecified: Secondary | ICD-10-CM | POA: Diagnosis not present

## 2018-08-28 DIAGNOSIS — R300 Dysuria: Secondary | ICD-10-CM | POA: Diagnosis not present

## 2018-08-28 DIAGNOSIS — M25569 Pain in unspecified knee: Secondary | ICD-10-CM | POA: Diagnosis not present

## 2018-12-02 ENCOUNTER — Other Ambulatory Visit: Payer: Self-pay | Admitting: Obstetrics and Gynecology

## 2018-12-02 DIAGNOSIS — Z1231 Encounter for screening mammogram for malignant neoplasm of breast: Secondary | ICD-10-CM

## 2019-01-12 DIAGNOSIS — R5383 Other fatigue: Secondary | ICD-10-CM | POA: Diagnosis not present

## 2019-01-12 DIAGNOSIS — E7800 Pure hypercholesterolemia, unspecified: Secondary | ICD-10-CM | POA: Diagnosis not present

## 2019-01-12 DIAGNOSIS — E039 Hypothyroidism, unspecified: Secondary | ICD-10-CM | POA: Diagnosis not present

## 2019-01-12 DIAGNOSIS — Z1159 Encounter for screening for other viral diseases: Secondary | ICD-10-CM | POA: Diagnosis not present

## 2019-01-12 DIAGNOSIS — L299 Pruritus, unspecified: Secondary | ICD-10-CM | POA: Diagnosis not present

## 2019-01-12 DIAGNOSIS — J309 Allergic rhinitis, unspecified: Secondary | ICD-10-CM | POA: Diagnosis not present

## 2019-01-12 DIAGNOSIS — R42 Dizziness and giddiness: Secondary | ICD-10-CM | POA: Diagnosis not present

## 2019-01-12 DIAGNOSIS — E559 Vitamin D deficiency, unspecified: Secondary | ICD-10-CM | POA: Diagnosis not present

## 2019-01-12 DIAGNOSIS — Z0000 Encounter for general adult medical examination without abnormal findings: Secondary | ICD-10-CM | POA: Diagnosis not present

## 2019-01-19 ENCOUNTER — Ambulatory Visit: Payer: PPO

## 2019-02-18 ENCOUNTER — Ambulatory Visit: Payer: PPO

## 2019-04-14 DIAGNOSIS — Z79899 Other long term (current) drug therapy: Secondary | ICD-10-CM | POA: Diagnosis not present

## 2019-04-14 DIAGNOSIS — H532 Diplopia: Secondary | ICD-10-CM | POA: Diagnosis not present

## 2019-04-14 DIAGNOSIS — H8110 Benign paroxysmal vertigo, unspecified ear: Secondary | ICD-10-CM | POA: Diagnosis not present

## 2019-04-14 DIAGNOSIS — L299 Pruritus, unspecified: Secondary | ICD-10-CM | POA: Diagnosis not present

## 2019-04-14 DIAGNOSIS — E039 Hypothyroidism, unspecified: Secondary | ICD-10-CM | POA: Diagnosis not present

## 2019-04-14 DIAGNOSIS — R5383 Other fatigue: Secondary | ICD-10-CM | POA: Diagnosis not present

## 2019-04-14 DIAGNOSIS — N898 Other specified noninflammatory disorders of vagina: Secondary | ICD-10-CM | POA: Diagnosis not present

## 2019-04-21 DIAGNOSIS — E039 Hypothyroidism, unspecified: Secondary | ICD-10-CM | POA: Diagnosis not present

## 2019-04-28 DIAGNOSIS — H527 Unspecified disorder of refraction: Secondary | ICD-10-CM | POA: Diagnosis not present

## 2019-05-06 DIAGNOSIS — E063 Autoimmune thyroiditis: Secondary | ICD-10-CM | POA: Diagnosis not present

## 2019-05-06 DIAGNOSIS — R5383 Other fatigue: Secondary | ICD-10-CM | POA: Diagnosis not present

## 2019-05-06 DIAGNOSIS — E039 Hypothyroidism, unspecified: Secondary | ICD-10-CM | POA: Diagnosis not present

## 2019-05-11 DIAGNOSIS — J029 Acute pharyngitis, unspecified: Secondary | ICD-10-CM | POA: Diagnosis not present

## 2019-05-28 DIAGNOSIS — E039 Hypothyroidism, unspecified: Secondary | ICD-10-CM | POA: Diagnosis not present

## 2019-10-01 DIAGNOSIS — R5383 Other fatigue: Secondary | ICD-10-CM | POA: Diagnosis not present

## 2019-10-01 DIAGNOSIS — E039 Hypothyroidism, unspecified: Secondary | ICD-10-CM | POA: Diagnosis not present

## 2019-10-02 DIAGNOSIS — N3000 Acute cystitis without hematuria: Secondary | ICD-10-CM | POA: Diagnosis not present

## 2019-10-02 DIAGNOSIS — R5383 Other fatigue: Secondary | ICD-10-CM | POA: Diagnosis not present

## 2019-10-02 DIAGNOSIS — E039 Hypothyroidism, unspecified: Secondary | ICD-10-CM | POA: Diagnosis not present

## 2019-11-02 ENCOUNTER — Other Ambulatory Visit: Payer: Self-pay | Admitting: Obstetrics and Gynecology

## 2019-11-02 DIAGNOSIS — Z1231 Encounter for screening mammogram for malignant neoplasm of breast: Secondary | ICD-10-CM

## 2019-11-02 DIAGNOSIS — R1900 Intra-abdominal and pelvic swelling, mass and lump, unspecified site: Secondary | ICD-10-CM | POA: Diagnosis not present

## 2019-11-02 DIAGNOSIS — M810 Age-related osteoporosis without current pathological fracture: Secondary | ICD-10-CM | POA: Diagnosis not present

## 2019-11-02 DIAGNOSIS — R87610 Atypical squamous cells of undetermined significance on cytologic smear of cervix (ASC-US): Secondary | ICD-10-CM | POA: Diagnosis not present

## 2019-11-02 DIAGNOSIS — Z9189 Other specified personal risk factors, not elsewhere classified: Secondary | ICD-10-CM | POA: Diagnosis not present

## 2019-11-02 DIAGNOSIS — N390 Urinary tract infection, site not specified: Secondary | ICD-10-CM | POA: Diagnosis not present

## 2019-11-02 DIAGNOSIS — D071 Carcinoma in situ of vulva: Secondary | ICD-10-CM | POA: Diagnosis not present

## 2019-11-05 DIAGNOSIS — R1900 Intra-abdominal and pelvic swelling, mass and lump, unspecified site: Secondary | ICD-10-CM | POA: Diagnosis not present

## 2019-11-10 DIAGNOSIS — N893 Dysplasia of vagina, unspecified: Secondary | ICD-10-CM | POA: Diagnosis not present

## 2019-11-10 DIAGNOSIS — D071 Carcinoma in situ of vulva: Secondary | ICD-10-CM | POA: Diagnosis not present

## 2019-11-10 DIAGNOSIS — R87610 Atypical squamous cells of undetermined significance on cytologic smear of cervix (ASC-US): Secondary | ICD-10-CM | POA: Diagnosis not present

## 2019-12-02 ENCOUNTER — Other Ambulatory Visit: Payer: Self-pay | Admitting: Obstetrics and Gynecology

## 2019-12-02 DIAGNOSIS — R87610 Atypical squamous cells of undetermined significance on cytologic smear of cervix (ASC-US): Secondary | ICD-10-CM | POA: Diagnosis not present

## 2019-12-02 DIAGNOSIS — R87810 Cervical high risk human papillomavirus (HPV) DNA test positive: Secondary | ICD-10-CM | POA: Diagnosis not present

## 2019-12-02 DIAGNOSIS — N898 Other specified noninflammatory disorders of vagina: Secondary | ICD-10-CM | POA: Diagnosis not present

## 2019-12-02 DIAGNOSIS — N952 Postmenopausal atrophic vaginitis: Secondary | ICD-10-CM | POA: Diagnosis not present

## 2019-12-08 DIAGNOSIS — R350 Frequency of micturition: Secondary | ICD-10-CM | POA: Diagnosis not present

## 2020-01-18 ENCOUNTER — Ambulatory Visit
Admission: RE | Admit: 2020-01-18 | Discharge: 2020-01-18 | Disposition: A | Discharge status: Home / Self Care | Payer: PPO | Source: Ambulatory Visit | Attending: Obstetrics and Gynecology | Admitting: Obstetrics and Gynecology

## 2020-01-18 ENCOUNTER — Other Ambulatory Visit: Payer: Self-pay

## 2020-01-18 DIAGNOSIS — M810 Age-related osteoporosis without current pathological fracture: Secondary | ICD-10-CM | POA: Diagnosis not present

## 2020-01-18 DIAGNOSIS — Z1231 Encounter for screening mammogram for malignant neoplasm of breast: Secondary | ICD-10-CM | POA: Diagnosis not present

## 2020-01-20 DIAGNOSIS — E7800 Pure hypercholesterolemia, unspecified: Secondary | ICD-10-CM | POA: Diagnosis not present

## 2020-01-20 DIAGNOSIS — E063 Autoimmune thyroiditis: Secondary | ICD-10-CM | POA: Diagnosis not present

## 2020-01-20 DIAGNOSIS — Z0000 Encounter for general adult medical examination without abnormal findings: Secondary | ICD-10-CM | POA: Diagnosis not present

## 2020-01-20 DIAGNOSIS — E559 Vitamin D deficiency, unspecified: Secondary | ICD-10-CM | POA: Diagnosis not present

## 2020-01-20 DIAGNOSIS — R5383 Other fatigue: Secondary | ICD-10-CM | POA: Diagnosis not present

## 2020-01-20 DIAGNOSIS — J309 Allergic rhinitis, unspecified: Secondary | ICD-10-CM | POA: Diagnosis not present

## 2020-01-20 DIAGNOSIS — E039 Hypothyroidism, unspecified: Secondary | ICD-10-CM | POA: Diagnosis not present

## 2020-05-17 DIAGNOSIS — H4921 Sixth [abducent] nerve palsy, right eye: Secondary | ICD-10-CM | POA: Diagnosis not present

## 2020-05-17 DIAGNOSIS — L821 Other seborrheic keratosis: Secondary | ICD-10-CM | POA: Diagnosis not present

## 2020-05-17 DIAGNOSIS — H18453 Nodular corneal degeneration, bilateral: Secondary | ICD-10-CM | POA: Diagnosis not present

## 2020-05-17 DIAGNOSIS — H524 Presbyopia: Secondary | ICD-10-CM | POA: Diagnosis not present

## 2020-05-17 DIAGNOSIS — D1801 Hemangioma of skin and subcutaneous tissue: Secondary | ICD-10-CM | POA: Diagnosis not present

## 2020-05-17 DIAGNOSIS — Z961 Presence of intraocular lens: Secondary | ICD-10-CM | POA: Diagnosis not present

## 2020-05-17 DIAGNOSIS — E039 Hypothyroidism, unspecified: Secondary | ICD-10-CM | POA: Diagnosis not present

## 2020-09-22 ENCOUNTER — Other Ambulatory Visit: Payer: Self-pay | Admitting: Orthopedic Surgery

## 2020-09-22 ENCOUNTER — Ambulatory Visit
Admission: RE | Admit: 2020-09-22 | Discharge: 2020-09-22 | Disposition: A | Discharge status: Home / Self Care | Payer: PPO | Source: Ambulatory Visit | Attending: Orthopedic Surgery | Admitting: Orthopedic Surgery

## 2020-09-22 DIAGNOSIS — M25552 Pain in left hip: Secondary | ICD-10-CM

## 2020-09-22 DIAGNOSIS — S32592A Other specified fracture of left pubis, initial encounter for closed fracture: Secondary | ICD-10-CM | POA: Diagnosis not present

## 2020-09-22 DIAGNOSIS — S32512A Fracture of superior rim of left pubis, initial encounter for closed fracture: Secondary | ICD-10-CM | POA: Diagnosis not present

## 2020-11-04 DIAGNOSIS — Z9189 Other specified personal risk factors, not elsewhere classified: Secondary | ICD-10-CM | POA: Diagnosis not present

## 2020-11-04 DIAGNOSIS — R87610 Atypical squamous cells of undetermined significance on cytologic smear of cervix (ASC-US): Secondary | ICD-10-CM | POA: Diagnosis not present

## 2020-11-04 DIAGNOSIS — Z87412 Personal history of vulvar dysplasia: Secondary | ICD-10-CM | POA: Diagnosis not present

## 2020-11-04 DIAGNOSIS — Z01419 Encounter for gynecological examination (general) (routine) without abnormal findings: Secondary | ICD-10-CM | POA: Diagnosis not present

## 2020-12-07 ENCOUNTER — Other Ambulatory Visit: Payer: Self-pay | Admitting: Family Medicine

## 2020-12-07 DIAGNOSIS — Z1231 Encounter for screening mammogram for malignant neoplasm of breast: Secondary | ICD-10-CM

## 2021-01-13 DIAGNOSIS — E039 Hypothyroidism, unspecified: Secondary | ICD-10-CM | POA: Diagnosis not present

## 2021-01-26 ENCOUNTER — Ambulatory Visit: Payer: PPO

## 2021-01-31 ENCOUNTER — Other Ambulatory Visit: Payer: Self-pay | Admitting: Family Medicine

## 2021-01-31 DIAGNOSIS — E7800 Pure hypercholesterolemia, unspecified: Secondary | ICD-10-CM | POA: Diagnosis not present

## 2021-01-31 DIAGNOSIS — Z136 Encounter for screening for cardiovascular disorders: Secondary | ICD-10-CM

## 2021-01-31 DIAGNOSIS — E039 Hypothyroidism, unspecified: Secondary | ICD-10-CM | POA: Diagnosis not present

## 2021-01-31 DIAGNOSIS — Z0000 Encounter for general adult medical examination without abnormal findings: Secondary | ICD-10-CM | POA: Diagnosis not present

## 2021-01-31 DIAGNOSIS — E559 Vitamin D deficiency, unspecified: Secondary | ICD-10-CM | POA: Diagnosis not present

## 2021-01-31 DIAGNOSIS — R5383 Other fatigue: Secondary | ICD-10-CM | POA: Diagnosis not present

## 2021-01-31 DIAGNOSIS — E063 Autoimmune thyroiditis: Secondary | ICD-10-CM | POA: Diagnosis not present

## 2021-01-31 DIAGNOSIS — J309 Allergic rhinitis, unspecified: Secondary | ICD-10-CM | POA: Diagnosis not present

## 2021-01-31 DIAGNOSIS — Z1211 Encounter for screening for malignant neoplasm of colon: Secondary | ICD-10-CM | POA: Diagnosis not present

## 2021-02-01 ENCOUNTER — Ambulatory Visit
Admission: RE | Admit: 2021-02-01 | Discharge: 2021-02-01 | Disposition: A | Discharge status: Home / Self Care | Payer: PPO | Source: Ambulatory Visit | Attending: Family Medicine | Admitting: Family Medicine

## 2021-02-01 DIAGNOSIS — Z1211 Encounter for screening for malignant neoplasm of colon: Secondary | ICD-10-CM | POA: Diagnosis not present

## 2021-02-01 DIAGNOSIS — Z8249 Family history of ischemic heart disease and other diseases of the circulatory system: Secondary | ICD-10-CM | POA: Diagnosis not present

## 2021-02-01 DIAGNOSIS — Z136 Encounter for screening for cardiovascular disorders: Secondary | ICD-10-CM | POA: Diagnosis not present

## 2021-02-09 DIAGNOSIS — E7800 Pure hypercholesterolemia, unspecified: Secondary | ICD-10-CM | POA: Diagnosis not present

## 2021-02-09 DIAGNOSIS — Z8249 Family history of ischemic heart disease and other diseases of the circulatory system: Secondary | ICD-10-CM | POA: Diagnosis not present

## 2021-03-17 ENCOUNTER — Other Ambulatory Visit: Payer: Self-pay

## 2021-03-17 ENCOUNTER — Ambulatory Visit
Admission: RE | Admit: 2021-03-17 | Discharge: 2021-03-17 | Disposition: A | Discharge status: Home / Self Care | Payer: PPO | Source: Ambulatory Visit | Attending: Family Medicine | Admitting: Family Medicine

## 2021-03-17 DIAGNOSIS — Z1231 Encounter for screening mammogram for malignant neoplasm of breast: Secondary | ICD-10-CM | POA: Diagnosis not present

## 2021-05-22 DIAGNOSIS — Z961 Presence of intraocular lens: Secondary | ICD-10-CM | POA: Diagnosis not present

## 2021-05-22 DIAGNOSIS — H4921 Sixth [abducent] nerve palsy, right eye: Secondary | ICD-10-CM | POA: Diagnosis not present

## 2021-07-10 DIAGNOSIS — E039 Hypothyroidism, unspecified: Secondary | ICD-10-CM | POA: Diagnosis not present

## 2021-07-10 DIAGNOSIS — E7800 Pure hypercholesterolemia, unspecified: Secondary | ICD-10-CM | POA: Diagnosis not present

## 2021-07-21 DIAGNOSIS — R5383 Other fatigue: Secondary | ICD-10-CM | POA: Diagnosis not present

## 2021-07-21 DIAGNOSIS — M791 Myalgia, unspecified site: Secondary | ICD-10-CM | POA: Diagnosis not present

## 2021-07-21 DIAGNOSIS — E559 Vitamin D deficiency, unspecified: Secondary | ICD-10-CM | POA: Diagnosis not present

## 2021-09-04 DIAGNOSIS — R202 Paresthesia of skin: Secondary | ICD-10-CM | POA: Diagnosis not present

## 2021-09-04 DIAGNOSIS — L570 Actinic keratosis: Secondary | ICD-10-CM | POA: Diagnosis not present

## 2021-09-04 DIAGNOSIS — L309 Dermatitis, unspecified: Secondary | ICD-10-CM | POA: Diagnosis not present

## 2021-09-04 DIAGNOSIS — L218 Other seborrheic dermatitis: Secondary | ICD-10-CM | POA: Diagnosis not present

## 2021-09-04 DIAGNOSIS — G5620 Lesion of ulnar nerve, unspecified upper limb: Secondary | ICD-10-CM | POA: Diagnosis not present

## 2021-09-19 ENCOUNTER — Encounter: Payer: Self-pay | Admitting: Cardiology

## 2021-09-19 NOTE — Progress Notes (Signed)
Cardiology Office Note   Date:  09/21/2021   ID:  Susan Bray, DOB 17-Mar-1946, MRN [id number]  PCP:  Aurea Graff.Marlou Sa, MD  Cardiologist:   None Referring:  Alroy Dust, L.Marlou Sa, MD  Chief Complaint  Patient presents with   Shortness of Breath       History of Present Illness: Susan Bray is a 75 y.o. female who presents for evaluation of dizziness, lightheadedness, arm discomfort.   I saw her in 2017.    She was found to have non obstructive and branch vessel disease and was managed medically.   She has not had cardiac evaluation since that time.  She does her household chores.  Her son lives with her.  She is taking care of some grandkids 6 and 62 years old.  She notices it when she walks up incline from her mailbox she does get short of breath.  She has to stop sometimes halfway up.  She feels fatigued and she sleeps more than she should she thinks.  She does have some occasional dizziness and lightheadedness.  She has not had any frank syncope.  She does not necessarily report palpitations.  She does not have chest pressure, neck or arm discomfort.  She has had no weight gain or edema.   Past Medical History:  Diagnosis Date   Cataract    Hyperlipidemia    IBS (irritable bowel syndrome)    VIN III (vulvar intraepithelial neoplasia III)     Past Surgical History:  Procedure Laterality Date   ABDOMINAL HYSTERECTOMY  1978   CARDIAC CATHETERIZATION N/A 12/06/2015   Procedure: Left Heart Cath and Coronary Angiography;  Surgeon: Belva Crome, MD;  Location: [facility];  Service: Cardiovascular;  Laterality: N/A;   CATARACT EXTRACTION  08/17/04 ; 09/12/2004   left eye and right eye   CATARACT EXTRACTION     laser vaporization of hg vaginal dysplasia  12/2006   p & e repairs with cardinal uterosatral coloposusension (pelvic area repair)  03/03/09   severe dysplasia near rectum  03/18/2013   tummy tuck  1982   upper colpectomy  09/1993     Current Outpatient  Medications  Medication Sig Dispense Refill   SYNTHROID 50 MCG tablet Take by mouth.     Azelastine HCl 0.15 % SOLN 1 spray in each nostril 1-2 times daily as needed. (Patient not taking: Reported on 09/21/2021) 30 mL 5   fexofenadine (ALLEGRA ALLERGY) 60 MG tablet Take 1 tablet (60 mg total) by mouth 2 (two) times daily. (Patient not taking: Reported on 09/21/2021) 60 tablet 5   ranitidine (ZANTAC) 150 MG capsule Take 1 capsule (150 mg total) by mouth 2 (two) times daily. (Patient not taking: Reported on 09/21/2021) 60 capsule 5   Respiratory Therapy Supplies (FLUTTER) DEVI Use as directed (Patient not taking: Reported on 09/21/2021) 1 each 0   No current facility-administered medications for this visit.    Allergies:   Colesevelam and Statins    Social History:  The patient  reports that she has never smoked. She has never used smokeless tobacco. She reports that she does not drink alcohol and does not use drugs.   Family History:  The patient's family history includes Arthritis in her mother; Breast cancer (age of onset: 30) in her mother; CAD (age of onset: 28) in her brother; Cancer in her mother; Heart attack (age of onset: 52) in her mother; Heart attack (age of onset: 15) in her father; Heart disease  in her maternal grandmother; Hypertension in her mother; Peripheral vascular disease (age of onset: 45) in her sister.    ROS:  Please see the history of present illness.   Otherwise, review of systems are positive for none.   All other systems are reviewed and negative.    PHYSICAL EXAM: VS:  BP 140/76 (BP Location: Right Arm)   Pulse 62   Ht 5\' 4"  (1.626 m)   Wt 134 lb 12.8 oz (61.1 kg)   SpO2 98%   BMI 23.14 kg/m  , BMI Body mass index is 23.14 kg/m. GENERAL:  Well appearing HEENT:  Pupils equal round and reactive, fundi not visualized, oral mucosa unremarkable NECK:  No jugular venous distention, waveform within normal limits, carotid upstroke brisk and symmetric, no bruits, no  thyromegaly LYMPHATICS:  No cervical, inguinal adenopathy LUNGS:  Clear to auscultation bilaterally BACK:  No CVA tenderness CHEST:  Unremarkable HEART:  PMI not displaced or sustained,S1 and S2 within normal limits, no S3, no S4, no clicks, no rubs, no murmurs ABD:  Flat, positive bowel sounds normal in frequency in pitch, no bruits, no rebound, no guarding, no midline pulsatile mass, no hepatomegaly, no splenomegaly EXT:  2 plus pulses throughout, no edema, no cyanosis no clubbing SKIN:  No rashes no nodules NEURO:  Cranial nerves II through XII grossly intact, motor grossly intact throughout PSYCH:  Cognitively intact, oriented to person place and time    EKG:  EKG is ordered today. The ekg ordered today demonstrates sinus rhythm, rate 62, axis within normal limits, intervals within normal limits, no acute ST-T wave changes.   Recent Labs: No results found for requested labs within last 8760 hours.    Lipid Panel    Component Value Date/Time   CHOL 149 11/15/2015 0901   TRIG 117 11/15/2015 0901   HDL 47 11/15/2015 0901   CHOLHDL 3.2 11/15/2015 0901   VLDL 23 11/15/2015 0901   LDLCALC 79 11/15/2015 0901      Wt Readings from Last 3 Encounters:  09/21/21 134 lb 12.8 oz (61.1 kg)  08/13/17 133 lb (60.3 kg)  12/06/15 128 lb (58.1 kg)    Cardiac cath 2017  Intervention   Other studies Reviewed: Additional studies/ records that were reviewed today include: Labs. Review of the above records demonstrates:  Please see elsewhere in the note.     ASSESSMENT AND PLAN:  CAD:   The patient does have some shortness of breath.  She has disease as described above.  I would like to screen her with a POET (Plain Old Exercise Treadmill).  She should be taking an aspirin.  DYSLIPIDEMIA: She has been intolerant of multiple statins.  I am going to set her up for an hour [facility] to start a PCSK9 inhibitor.  Her most recent lipid was LDL 142, triglycerides 161, total cholesterol  221, HDL 26.8  HTN: Her blood pressure is mildly elevated today.  We gave her a blood pressure cuff with instructions on how to keep a blood pressure diary and send those results to [place].  Current medicines are reviewed at length with the patient today.  The patient does not have concerns regarding medicines.  The following changes have been made: As above  Labs/ tests ordered today include:   Orders Placed This Encounter  Procedures   EXERCISE TOLERANCE TEST (ETT)   EKG 12-Lead      Disposition:   FU with 6 months   Signed, Minus Breeding, MD  09/21/2021  9:55 AM    [facility]

## 2021-09-21 ENCOUNTER — Other Ambulatory Visit: Payer: Self-pay

## 2021-09-21 ENCOUNTER — Encounter: Payer: Self-pay | Admitting: Cardiology

## 2021-09-21 ENCOUNTER — Ambulatory Visit: Payer: PPO | Admitting: Cardiology

## 2021-09-21 VITALS — BP 140/76 | HR 62 | Ht 64.0 in | Wt 134.8 lb

## 2021-09-21 DIAGNOSIS — R0602 Shortness of breath: Secondary | ICD-10-CM

## 2021-09-21 DIAGNOSIS — I2510 Atherosclerotic heart disease of native coronary artery without angina pectoris: Secondary | ICD-10-CM | POA: Diagnosis not present

## 2021-09-21 DIAGNOSIS — E785 Hyperlipidemia, unspecified: Secondary | ICD-10-CM | POA: Diagnosis not present

## 2021-09-21 NOTE — Patient Instructions (Signed)
Medication Instructions:  START: Aspirin 81 mg daily  *If you need a refill on your cardiac medications before your next appointment, please call your pharmacy*   Lab Work: None ordered today   Testing/Procedures: Your physician has requested that you have an exercise tolerance test. For further information please visit [email or web address]. Please also follow instruction sheet, as given. [place]. [address]    Follow-Up: At [facility], you and your health needs are our priority.  As part of our continuing mission to provide you with exceptional heart care, we have created designated Provider Care Teams.  These Care Teams include your primary Cardiologist (physician) and Advanced Practice Providers (APPs -  Physician Assistants and Nurse Practitioners) who all work together to provide you with the care you need, when you need it.  We recommend signing up for the patient portal called "MyChart".  Sign up information is provided on this After Visit Summary.  MyChart is used to connect with patients for Virtual Visits (Telemedicine).  Patients are able to view lab/test results, encounter notes, upcoming appointments, etc.  Non-urgent messages can be sent to your provider as well.   To learn more about what you can do with MyChart, go to [email or web address].    Your next appointment:   6 month(s)  The format for your next appointment:   In Person  Provider:   Minus Breeding, MD  Your physician recommends that you schedule a follow-up appointment first available with Pharm D.       [facility] at [facility] [address] [place], [place] [address] Phone:  [phone]        You are scheduled for an Exercise Stress Test  Please arrive 15 minutes prior to your appointment time for registration and insurance purposes.  The test will take approximately 45 minutes to complete.  How to prepare for your Exercise Stress  Test: Do bring a list of your current medications with you.  If not listed below, you may take your medications as normal. Do wear comfortable clothes (no dresses or overalls) and walking shoes, tennis shoes preferred (no heels or open toed shoes are allowed) Do Not wear cologne, perfume, aftershave or lotions (deodorant is allowed). Please report to [place], [address] for your test.  If these instructions are not followed, your test will have to be rescheduled.  If you have questions or concerns about your appointment, you can call the Stress Lab at [phone].  If you cannot keep your appointment, please provide 24 hours notification to the Stress Lab, to avoid a possible $50 charge to your account

## 2021-09-22 DIAGNOSIS — G5620 Lesion of ulnar nerve, unspecified upper limb: Secondary | ICD-10-CM | POA: Diagnosis not present

## 2021-09-22 DIAGNOSIS — E039 Hypothyroidism, unspecified: Secondary | ICD-10-CM | POA: Diagnosis not present

## 2021-10-05 DIAGNOSIS — M542 Cervicalgia: Secondary | ICD-10-CM | POA: Diagnosis not present

## 2021-10-11 DIAGNOSIS — G5620 Lesion of ulnar nerve, unspecified upper limb: Secondary | ICD-10-CM | POA: Diagnosis not present

## 2021-10-11 DIAGNOSIS — E039 Hypothyroidism, unspecified: Secondary | ICD-10-CM | POA: Diagnosis not present

## 2021-10-12 ENCOUNTER — Ambulatory Visit: Payer: PPO

## 2021-10-12 ENCOUNTER — Other Ambulatory Visit: Payer: Self-pay

## 2021-10-12 ENCOUNTER — Inpatient Hospital Stay (HOSPITAL_COMMUNITY): Admission: RE | Admit: 2021-10-12 | Payer: PPO | Source: Ambulatory Visit

## 2021-10-12 ENCOUNTER — Encounter: Payer: Self-pay | Admitting: Neurology

## 2021-10-12 DIAGNOSIS — R202 Paresthesia of skin: Secondary | ICD-10-CM

## 2021-10-19 ENCOUNTER — Telehealth: Payer: Self-pay | Admitting: Neurology

## 2021-10-19 NOTE — Telephone Encounter (Signed)
Patient said her insurance cant verify if they will approve the EMG. They need the office to call them. Patient left message, I called her, we got disconnected, I called her back and left message

## 2021-10-19 NOTE — Telephone Encounter (Signed)
These are the codes that I have but may need to check with Posey Pronto to see if they changed   One limb - [place], [place]  Two limbs - [id number], [id number], [phone]

## 2021-10-19 NOTE — Telephone Encounter (Signed)
Called patient and informed her that we do not do Prior Auths for [facility] and offered her CPT codes to provide to her insurance. Patient stated that she called Dr. Alvester Morin office and they are going to do it for her. Patient had no further questions or concerns.

## 2021-10-25 ENCOUNTER — Telehealth (HOSPITAL_COMMUNITY): Payer: Self-pay | Admitting: Cardiology

## 2021-10-25 NOTE — Telephone Encounter (Signed)
I called patient to reschedule GXT that she cancelled. Patient does not wish to have this test. Order will be removed from the WQ. Thank you.

## 2021-11-09 DIAGNOSIS — L814 Other melanin hyperpigmentation: Secondary | ICD-10-CM | POA: Diagnosis not present

## 2021-11-09 DIAGNOSIS — L570 Actinic keratosis: Secondary | ICD-10-CM | POA: Diagnosis not present

## 2021-11-09 DIAGNOSIS — L298 Other pruritus: Secondary | ICD-10-CM | POA: Diagnosis not present

## 2021-11-09 DIAGNOSIS — D485 Neoplasm of uncertain behavior of skin: Secondary | ICD-10-CM | POA: Diagnosis not present

## 2021-11-09 DIAGNOSIS — L821 Other seborrheic keratosis: Secondary | ICD-10-CM | POA: Diagnosis not present

## 2021-11-09 DIAGNOSIS — L432 Lichenoid drug reaction: Secondary | ICD-10-CM | POA: Diagnosis not present

## 2021-11-09 DIAGNOSIS — D1801 Hemangioma of skin and subcutaneous tissue: Secondary | ICD-10-CM | POA: Diagnosis not present

## 2021-11-10 DIAGNOSIS — E039 Hypothyroidism, unspecified: Secondary | ICD-10-CM | POA: Diagnosis not present

## 2021-11-14 ENCOUNTER — Ambulatory Visit (INDEPENDENT_AMBULATORY_CARE_PROVIDER_SITE_OTHER): Payer: PPO | Admitting: Neurology

## 2021-11-14 ENCOUNTER — Other Ambulatory Visit: Payer: Self-pay

## 2021-11-14 DIAGNOSIS — E538 Deficiency of other specified B group vitamins: Secondary | ICD-10-CM | POA: Diagnosis not present

## 2021-11-14 DIAGNOSIS — R202 Paresthesia of skin: Secondary | ICD-10-CM

## 2021-11-14 DIAGNOSIS — G5623 Lesion of ulnar nerve, bilateral upper limbs: Secondary | ICD-10-CM

## 2021-11-14 NOTE — Procedures (Signed)
[facility]  [place], [place]  [place], [place] [address] Tel: [phone] Fax:  [phone] Test Date:  11/14/2021  Patient: Susan Bray DOB: 1946/01/15 Physician: Narda Amber, DO  Sex: Female Height: 5\' 4"  Ref Phys: Faythe Casa, MD  ID#: [id number]   Technician:    Patient Complaints: This is a 76 year old female referred for evaluation of bilateral hand paresthesias.  NCV & EMG Findings: Extensive electrodiagnostic testing of the right upper extremity and additional studies of the left shows:  Bilateral median, ulnar, and mixed palmar sensory responses are within normal limits. Bilateral median motor responses are within normal limits.  Bilateral ulnar motor responses shows reduced amplitude at the abductor digiti minimi and first dorsal interosseous (R4.9, L5.5, R5.9, L5.9 mV) and decreased conduction velocity (A Elbow-B Elbow, R40, L40, R38, L45 m/s).   Chronic motor axonal loss changes are seen affecting the ulnar innervated muscles bilaterally, without accompanying active denervation.    Impression: Bilateral ulnar neuropathy with slowing across the elbow, with demyelinating and axonal features.  Overall, these findings are moderate-to-severe in degree electrically.   ___________________________ Narda Amber, DO    Nerve Conduction Studies Anti Sensory Summary Table   Stim Site NR Peak (ms) Norm Peak (ms) P-T Amp (V) Norm P-T Amp  Left Median Anti Sensory (2nd Digit)  32C  Wrist    2.8 <3.8 17.7 >10  Right Median Anti Sensory (2nd Digit)  32C  Wrist    2.7 <3.8 34.6 >10  Left Ulnar Anti Sensory (5th Digit)  32C  Wrist    2.9 <3.2 21.4 >5  Right Ulnar Anti Sensory (5th Digit)  32C  Wrist    2.7 <3.2 26.0 >5   Motor Summary Table   Stim Site NR Onset (ms) Norm Onset (ms) O-P Amp (mV) Norm O-P Amp Site1 Site2 Delta-0 (ms) Dist (cm) Vel (m/s) Norm Vel (m/s)  Left Median Motor (Abd Poll Brev)  32C  Wrist    2.8 <4.0 5.1 >5 Elbow Wrist 4.4  28.0 64 >50  Elbow    7.2  4.4         Right Median Motor (Abd Poll Brev)  32C  Wrist    2.6 <4.0 5.5 >5 Elbow Wrist 4.4 28.0 64 >50  Elbow    7.0  4.8         Left Ulnar Motor (Abd Dig Minimi)  32C  Wrist    2.4 <3.1 5.5 >7 B Elbow Wrist 3.1 20.0 65 >50  B Elbow    5.5  5.3  A Elbow B Elbow 2.5 10.0 40 >50  A Elbow    8.0  4.7         Right Ulnar Motor (Abd Dig Minimi)  32C  Wrist    2.4 <3.1 4.9 >7 B Elbow Wrist 3.5 21.0 60 >50  B Elbow    5.9  4.7  A Elbow B Elbow 2.5 10.0 40 >50  A Elbow    8.4  3.6         Left Ulnar (FDI) Motor (1st DI)  32C  Wrist    3.6 <4.5 5.9 >7 B Elbow Wrist 3.3 20.0 61 >50  B Elbow    6.9  5.9  A Elbow B Elbow 2.2 10.0 45 >50  A Elbow    9.1  5.2         Right Ulnar (FDI) Motor (1st DI)  32C  Wrist    3.3 <4.5 5.9 >7 B Elbow  Wrist 3.6 21.0 58 >50  B Elbow    6.9  5.4  A Elbow B Elbow 2.6 10.0 38 >50  A Elbow    9.5  3.1          Comparison Summary Table   Stim Site NR Peak (ms) Norm Peak (ms) P-T Amp (V) Site1 Site2 Delta-P (ms) Norm Delta (ms)  Left Median/Ulnar Palm Comparison (Wrist - 8cm)  32C  Median Palm    1.8 <2.2 119.2 Median Palm Ulnar Palm 0.2   Ulnar Palm    1.6 <2.2 10.0      Right Median/Ulnar Palm Comparison (Wrist - 8cm)  32C  Median Palm    1.4 <2.2 105.8 Median Palm Ulnar Palm 0.2   Ulnar Palm    1.2 <2.2 11.3       EMG   Side Muscle Ins Act Fibs Psw Fasc Number Recrt Dur Dur. Amp Amp. Poly Poly. Comment  Right 1stDorInt Nml Nml Nml Nml 1- Rapid Some 1+ Some 1+ Some 1+ N/A  Right PronatorTeres Nml Nml Nml Nml Nml Nml Nml Nml Nml Nml Nml Nml N/A  Right Biceps Nml Nml Nml Nml Nml Nml Nml Nml Nml Nml Nml Nml N/A  Right Triceps Nml Nml Nml Nml Nml Nml Nml Nml Nml Nml Nml Nml N/A  Right Deltoid Nml Nml Nml Nml Nml Nml Nml Nml Nml Nml Nml Nml N/A  Right ABD Dig Min Nml Nml Nml Nml 1- Rapid Some 1+ Some 1+ Some 1+ N/A  Right FlexCarpiUln Nml Nml Nml Nml Nml Nml Nml Nml Nml Nml Nml Nml N/A  Left 1stDorInt Nml Nml Nml Nml 1-  Rapid Few 1+ Few 1+ Few 1+ N/A  Left PronatorTeres Nml Nml Nml Nml Nml Nml Nml Nml Nml Nml Nml Nml N/A  Left Biceps Nml Nml Nml Nml Nml Nml Nml Nml Nml Nml Nml Nml N/A  Left Triceps Nml Nml Nml Nml Nml Nml Nml Nml Nml Nml Nml Nml N/A  Left ABD Dig Min Nml Nml Nml Nml 1- Rapid Few 1+ Few 1+ Few 1+ N/A  Left FlexCarpiUln Nml Nml Nml Nml Nml Nml Nml Nml Nml Nml Nml Nml N/A      Waveforms:

## 2021-11-16 DIAGNOSIS — M542 Cervicalgia: Secondary | ICD-10-CM | POA: Diagnosis not present

## 2021-11-17 ENCOUNTER — Ambulatory Visit (INDEPENDENT_AMBULATORY_CARE_PROVIDER_SITE_OTHER): Payer: PPO | Admitting: Orthopedic Surgery

## 2021-11-17 ENCOUNTER — Other Ambulatory Visit: Payer: Self-pay

## 2021-11-17 ENCOUNTER — Encounter: Payer: Self-pay | Admitting: Orthopedic Surgery

## 2021-11-17 DIAGNOSIS — L298 Other pruritus: Secondary | ICD-10-CM | POA: Diagnosis not present

## 2021-11-17 DIAGNOSIS — G5623 Lesion of ulnar nerve, bilateral upper limbs: Secondary | ICD-10-CM | POA: Insufficient documentation

## 2021-11-17 NOTE — Progress Notes (Signed)
Office Visit Note   Patient: Susan Bray           Date of Birth: 10-11-1946           MRN: [id number] Visit Date: 11/17/2021              Requested by: Alroy Dust, L.Marlou Sa, [address] [employer] [address] [place],  [place] [address] PCP: Alroy Dust, L.Marlou Sa, MD   Assessment & Plan: Visit Diagnoses:  1. Cubital tunnel syndrome, bilateral     Plan: Patient was seen by Dr. French Ana yesterday.  He discussed the nature of cubital tunnel syndrome and treatment options.  They decided to try night splints.  She was referred to our OT department to have night splints made.  Patient is unclear if she'll see Dr. French Ana back after a trial of splinting.  I will put in a referral to have the splints made and she will contact his office.   Follow-Up Instructions: No follow-ups on file.   Orders:  No orders of the defined types were placed in this encounter.  No orders of the defined types were placed in this encounter.     Procedures: No procedures performed   Clinical Data: No additional findings.   Subjective: Chief Complaint  Patient presents with   Right Elbow - Pain   Left Elbow - Pain    This is a 76 yo F w/ bilateral numbness and tingling involving the ring and small fingers.  This has been going on for a while.  She was seen by Dr. French Ana yesterday to review her EMG/NCS results and discuss treatment options.  They decided to try night splints.  She was sent to our office to have splints made.    Review of Systems   Objective: Vital Signs: There were no vitals taken for this visit.  Physical Exam  Right Hand Exam   Tests  Phalens Sign: negative Tinel's sign (median nerve): negative  Other  Erythema: absent Pulse: present   Left Hand Exam   Tests  Phalens Sign: negative Tinel's sign (median nerve): negative  Other  Erythema: absent Pulse: present   Right Elbow Exam   Tenderness  The patient is experiencing no tenderness.   Range of Motion  The  patient has normal right elbow ROM.  Muscle Strength  The patient has normal right elbow strength.  Tests  Tinel's sign (cubital tunnel): positive  Other  Sensation: normal Pulse: present  Comments:  No ulnar nerve subluxation.    Left Elbow Exam   Tenderness  The patient is experiencing no tenderness.   Range of Motion  The patient has normal left elbow ROM.  Muscle Strength  The patient has normal left elbow strength.  Tests  Tinel's sign (cubital tunnel): positive  Other  Sensation: normal Pulse: present  Comments:  No ulnar nerve subluxation.      Specialty Comments:  No specialty comments available.  Imaging: No results found.   PMFS History: Patient Active Problem List   Diagnosis Date Noted   Cubital tunnel syndrome, bilateral 11/17/2021   Pruritus 08/13/2017   Pruritus 08/13/2017   Cough, persistent 08/13/2017   GERD (gastroesophageal reflux disease) 08/13/2017   Chronic rhinitis 08/13/2017   Dyspnea 12/06/2015   Chest pain 12/06/2015   Hyperlipidemia 09/08/2014   Ear stuffiness 09/08/2014   Sore throat 07/30/2014   Past Medical History:  Diagnosis Date   Cataract    Hyperlipidemia    IBS (irritable bowel syndrome)    VIN  III (vulvar intraepithelial neoplasia III)     Family History  Problem Relation Age of Onset   Arthritis Mother    Cancer Mother        breast   Hypertension Mother    Heart attack Mother 72   Breast cancer Mother 48   Heart attack Father 5   Heart disease Maternal Grandmother    CAD Brother 73   Peripheral vascular disease Sister 7    Past Surgical History:  Procedure Laterality Date   ABDOMINAL HYSTERECTOMY  1978   CARDIAC CATHETERIZATION N/A 12/06/2015   Procedure: Left Heart Cath and Coronary Angiography;  Surgeon: Belva Crome, MD;  Location: [facility];  Service: Cardiovascular;  Laterality: N/A;   CATARACT EXTRACTION  08/17/04 ; 09/12/2004   left eye and right eye   CATARACT EXTRACTION      laser vaporization of hg vaginal dysplasia  12/2006   p & e repairs with cardinal uterosatral coloposusension (pelvic area repair)  03/03/09   severe dysplasia near rectum  03/18/2013   tummy tuck  1982   upper colpectomy  09/1993   Social History   Occupational History   Not on file  Tobacco Use   Smoking status: Never   Smokeless tobacco: Never  Vaping Use   Vaping Use: Never used  Substance and Sexual Activity   Alcohol use: No    Alcohol/week: 0.0 standard drinks   Drug use: No   Sexual activity: Not on file

## 2021-11-20 DIAGNOSIS — S5402XD Injury of ulnar nerve at forearm level, left arm, subsequent encounter: Secondary | ICD-10-CM | POA: Diagnosis not present

## 2021-11-20 DIAGNOSIS — S5401XD Injury of ulnar nerve at forearm level, right arm, subsequent encounter: Secondary | ICD-10-CM | POA: Diagnosis not present

## 2021-11-24 DIAGNOSIS — S5402XD Injury of ulnar nerve at forearm level, left arm, subsequent encounter: Secondary | ICD-10-CM | POA: Diagnosis not present

## 2021-11-24 DIAGNOSIS — S5401XD Injury of ulnar nerve at forearm level, right arm, subsequent encounter: Secondary | ICD-10-CM | POA: Diagnosis not present

## 2021-11-29 DIAGNOSIS — S5401XD Injury of ulnar nerve at forearm level, right arm, subsequent encounter: Secondary | ICD-10-CM | POA: Diagnosis not present

## 2021-11-29 DIAGNOSIS — S5402XD Injury of ulnar nerve at forearm level, left arm, subsequent encounter: Secondary | ICD-10-CM | POA: Diagnosis not present

## 2021-12-13 DIAGNOSIS — E538 Deficiency of other specified B group vitamins: Secondary | ICD-10-CM | POA: Diagnosis not present

## 2021-12-13 DIAGNOSIS — R5383 Other fatigue: Secondary | ICD-10-CM | POA: Diagnosis not present

## 2021-12-13 DIAGNOSIS — E039 Hypothyroidism, unspecified: Secondary | ICD-10-CM | POA: Diagnosis not present

## 2021-12-13 DIAGNOSIS — J029 Acute pharyngitis, unspecified: Secondary | ICD-10-CM | POA: Diagnosis not present

## 2021-12-13 DIAGNOSIS — L309 Dermatitis, unspecified: Secondary | ICD-10-CM | POA: Diagnosis not present

## 2021-12-13 DIAGNOSIS — E559 Vitamin D deficiency, unspecified: Secondary | ICD-10-CM | POA: Diagnosis not present

## 2021-12-13 DIAGNOSIS — G5620 Lesion of ulnar nerve, unspecified upper limb: Secondary | ICD-10-CM | POA: Diagnosis not present

## 2022-01-10 DIAGNOSIS — R21 Rash and other nonspecific skin eruption: Secondary | ICD-10-CM | POA: Diagnosis not present

## 2022-01-10 DIAGNOSIS — G5623 Lesion of ulnar nerve, bilateral upper limbs: Secondary | ICD-10-CM | POA: Diagnosis not present

## 2022-01-10 DIAGNOSIS — E559 Vitamin D deficiency, unspecified: Secondary | ICD-10-CM | POA: Diagnosis not present

## 2022-01-10 DIAGNOSIS — E538 Deficiency of other specified B group vitamins: Secondary | ICD-10-CM | POA: Diagnosis not present

## 2022-01-10 DIAGNOSIS — M6281 Muscle weakness (generalized): Secondary | ICD-10-CM | POA: Diagnosis not present

## 2022-01-10 DIAGNOSIS — L299 Pruritus, unspecified: Secondary | ICD-10-CM | POA: Diagnosis not present

## 2022-01-10 DIAGNOSIS — E039 Hypothyroidism, unspecified: Secondary | ICD-10-CM | POA: Diagnosis not present

## 2022-01-10 DIAGNOSIS — R1310 Dysphagia, unspecified: Secondary | ICD-10-CM | POA: Diagnosis not present

## 2022-01-25 DIAGNOSIS — K219 Gastro-esophageal reflux disease without esophagitis: Secondary | ICD-10-CM | POA: Diagnosis not present

## 2022-01-25 DIAGNOSIS — J309 Allergic rhinitis, unspecified: Secondary | ICD-10-CM | POA: Diagnosis not present

## 2022-01-25 DIAGNOSIS — R490 Dysphonia: Secondary | ICD-10-CM | POA: Diagnosis not present

## 2022-02-01 ENCOUNTER — Other Ambulatory Visit: Payer: Self-pay | Admitting: Family Medicine

## 2022-02-01 DIAGNOSIS — Z1231 Encounter for screening mammogram for malignant neoplasm of breast: Secondary | ICD-10-CM

## 2022-02-08 DIAGNOSIS — E7800 Pure hypercholesterolemia, unspecified: Secondary | ICD-10-CM | POA: Diagnosis not present

## 2022-02-08 DIAGNOSIS — Z124 Encounter for screening for malignant neoplasm of cervix: Secondary | ICD-10-CM | POA: Diagnosis not present

## 2022-02-08 DIAGNOSIS — Z131 Encounter for screening for diabetes mellitus: Secondary | ICD-10-CM | POA: Diagnosis not present

## 2022-02-08 DIAGNOSIS — G5620 Lesion of ulnar nerve, unspecified upper limb: Secondary | ICD-10-CM | POA: Diagnosis not present

## 2022-02-08 DIAGNOSIS — Z0000 Encounter for general adult medical examination without abnormal findings: Secondary | ICD-10-CM | POA: Diagnosis not present

## 2022-02-08 DIAGNOSIS — R350 Frequency of micturition: Secondary | ICD-10-CM | POA: Diagnosis not present

## 2022-02-08 DIAGNOSIS — R8290 Unspecified abnormal findings in urine: Secondary | ICD-10-CM | POA: Diagnosis not present

## 2022-02-08 DIAGNOSIS — E039 Hypothyroidism, unspecified: Secondary | ICD-10-CM | POA: Diagnosis not present

## 2022-02-12 DIAGNOSIS — M7989 Other specified soft tissue disorders: Secondary | ICD-10-CM | POA: Diagnosis not present

## 2022-02-12 DIAGNOSIS — M79641 Pain in right hand: Secondary | ICD-10-CM | POA: Diagnosis not present

## 2022-02-12 DIAGNOSIS — M79672 Pain in left foot: Secondary | ICD-10-CM | POA: Diagnosis not present

## 2022-02-12 DIAGNOSIS — Z79899 Other long term (current) drug therapy: Secondary | ICD-10-CM | POA: Diagnosis not present

## 2022-02-12 DIAGNOSIS — M79642 Pain in left hand: Secondary | ICD-10-CM | POA: Diagnosis not present

## 2022-02-12 DIAGNOSIS — M79643 Pain in unspecified hand: Secondary | ICD-10-CM | POA: Diagnosis not present

## 2022-02-12 DIAGNOSIS — M791 Myalgia, unspecified site: Secondary | ICD-10-CM | POA: Diagnosis not present

## 2022-02-12 DIAGNOSIS — R21 Rash and other nonspecific skin eruption: Secondary | ICD-10-CM | POA: Diagnosis not present

## 2022-02-12 DIAGNOSIS — M1990 Unspecified osteoarthritis, unspecified site: Secondary | ICD-10-CM | POA: Diagnosis not present

## 2022-02-12 DIAGNOSIS — M609 Myositis, unspecified: Secondary | ICD-10-CM | POA: Diagnosis not present

## 2022-02-12 DIAGNOSIS — M79671 Pain in right foot: Secondary | ICD-10-CM | POA: Diagnosis not present

## 2022-02-26 DIAGNOSIS — R682 Dry mouth, unspecified: Secondary | ICD-10-CM | POA: Diagnosis not present

## 2022-02-26 DIAGNOSIS — Z79899 Other long term (current) drug therapy: Secondary | ICD-10-CM | POA: Diagnosis not present

## 2022-02-26 DIAGNOSIS — R21 Rash and other nonspecific skin eruption: Secondary | ICD-10-CM | POA: Diagnosis not present

## 2022-02-26 DIAGNOSIS — M3505 Sjogren syndrome with inflammatory arthritis: Secondary | ICD-10-CM | POA: Diagnosis not present

## 2022-02-26 DIAGNOSIS — M1990 Unspecified osteoarthritis, unspecified site: Secondary | ICD-10-CM | POA: Diagnosis not present

## 2022-02-26 DIAGNOSIS — M3390 Dermatopolymyositis, unspecified, organ involvement unspecified: Secondary | ICD-10-CM | POA: Diagnosis not present

## 2022-02-26 DIAGNOSIS — H04129 Dry eye syndrome of unspecified lacrimal gland: Secondary | ICD-10-CM | POA: Diagnosis not present

## 2022-03-20 ENCOUNTER — Ambulatory Visit: Payer: PPO

## 2022-03-23 DIAGNOSIS — E538 Deficiency of other specified B group vitamins: Secondary | ICD-10-CM | POA: Diagnosis not present

## 2022-03-23 DIAGNOSIS — N179 Acute kidney failure, unspecified: Secondary | ICD-10-CM | POA: Diagnosis not present

## 2022-03-23 DIAGNOSIS — E559 Vitamin D deficiency, unspecified: Secondary | ICD-10-CM | POA: Diagnosis not present

## 2022-03-26 ENCOUNTER — Other Ambulatory Visit: Payer: Self-pay | Admitting: Family Medicine

## 2022-03-26 DIAGNOSIS — R682 Dry mouth, unspecified: Secondary | ICD-10-CM | POA: Diagnosis not present

## 2022-03-26 DIAGNOSIS — M1990 Unspecified osteoarthritis, unspecified site: Secondary | ICD-10-CM | POA: Diagnosis not present

## 2022-03-26 DIAGNOSIS — M3390 Dermatopolymyositis, unspecified, organ involvement unspecified: Secondary | ICD-10-CM | POA: Diagnosis not present

## 2022-03-26 DIAGNOSIS — Z79899 Other long term (current) drug therapy: Secondary | ICD-10-CM | POA: Diagnosis not present

## 2022-03-26 DIAGNOSIS — R21 Rash and other nonspecific skin eruption: Secondary | ICD-10-CM | POA: Diagnosis not present

## 2022-03-26 DIAGNOSIS — H04129 Dry eye syndrome of unspecified lacrimal gland: Secondary | ICD-10-CM | POA: Diagnosis not present

## 2022-03-26 DIAGNOSIS — Z1231 Encounter for screening mammogram for malignant neoplasm of breast: Secondary | ICD-10-CM

## 2022-03-26 DIAGNOSIS — R634 Abnormal weight loss: Secondary | ICD-10-CM | POA: Diagnosis not present

## 2022-03-26 DIAGNOSIS — M3505 Sjogren syndrome with inflammatory arthritis: Secondary | ICD-10-CM | POA: Diagnosis not present

## 2022-03-27 ENCOUNTER — Other Ambulatory Visit: Payer: Self-pay | Admitting: Rheumatology

## 2022-03-27 DIAGNOSIS — H5022 Vertical strabismus, left eye: Secondary | ICD-10-CM | POA: Diagnosis not present

## 2022-03-27 DIAGNOSIS — H43812 Vitreous degeneration, left eye: Secondary | ICD-10-CM | POA: Diagnosis not present

## 2022-03-27 DIAGNOSIS — N949 Unspecified condition associated with female genital organs and menstrual cycle: Secondary | ICD-10-CM | POA: Diagnosis not present

## 2022-03-27 DIAGNOSIS — M3390 Dermatopolymyositis, unspecified, organ involvement unspecified: Secondary | ICD-10-CM

## 2022-03-27 DIAGNOSIS — H5000 Unspecified esotropia: Secondary | ICD-10-CM | POA: Diagnosis not present

## 2022-03-27 DIAGNOSIS — Z961 Presence of intraocular lens: Secondary | ICD-10-CM | POA: Diagnosis not present

## 2022-03-27 DIAGNOSIS — R300 Dysuria: Secondary | ICD-10-CM | POA: Diagnosis not present

## 2022-03-27 DIAGNOSIS — H532 Diplopia: Secondary | ICD-10-CM | POA: Diagnosis not present

## 2022-03-28 ENCOUNTER — Other Ambulatory Visit: Payer: Self-pay | Admitting: Rheumatology

## 2022-03-28 DIAGNOSIS — R634 Abnormal weight loss: Secondary | ICD-10-CM

## 2022-03-29 ENCOUNTER — Ambulatory Visit
Admission: RE | Admit: 2022-03-29 | Discharge: 2022-03-29 | Disposition: A | Discharge status: Home / Self Care | Payer: PPO | Source: Ambulatory Visit

## 2022-03-29 DIAGNOSIS — Z1231 Encounter for screening mammogram for malignant neoplasm of breast: Secondary | ICD-10-CM

## 2022-04-04 DIAGNOSIS — I10 Essential (primary) hypertension: Secondary | ICD-10-CM | POA: Insufficient documentation

## 2022-04-04 DIAGNOSIS — I2510 Atherosclerotic heart disease of native coronary artery without angina pectoris: Secondary | ICD-10-CM | POA: Insufficient documentation

## 2022-04-04 NOTE — Progress Notes (Signed)
Cardiology Office Note   Date:  04/05/2022   ID:  Susan Bray, DOB 07/16/1946, MRN [id number]  PCP:  Collene Leyden, MD  Cardiologist:   Minus Breeding, MD Referring:  Collene Leyden, MD  Chief Complaint  Patient presents with   Shortness of Breath       History of Present Illness: Susan Bray is a 76 y.o. female who presents for evaluation of dizziness, lightheadedness, arm discomfort.   I saw her in 2017.    She was found to have non obstructive and branch vessel disease and was managed medically.   She has not had cardiac evaluation since that time.  Since I last saw her she has had some increased SOB.  She reports that this is happening if she walks 50 yards on level ground.  She thinks it is slowly increased.  She did describe this to some degree previously but she thinks it might be worse.  She has had decreased exercise tolerance.  She denies any chest pressure, neck or arm discomfort.  She has had no new PND or orthopnea.  She had no palpitations.  She does get lightheaded but she is not describing presyncope or syncope.  She lives with her son.  She helps take care of 2 grandkids.   Past Medical History:  Diagnosis Date   CAD (coronary artery disease)    Cataract    Hyperlipidemia    IBS (irritable bowel syndrome)    VIN III (vulvar intraepithelial neoplasia III)     Past Surgical History:  Procedure Laterality Date   ABDOMINAL HYSTERECTOMY  1978   CARDIAC CATHETERIZATION N/A 12/06/2015   Procedure: Left Heart Cath and Coronary Angiography;  Surgeon: Belva Crome, MD;  Location: [place] [facility];  Service: Cardiovascular;  Laterality: N/A;   CATARACT EXTRACTION  08/17/04 ; 09/12/2004   left eye and right eye   CATARACT EXTRACTION     laser vaporization of hg vaginal dysplasia  12/2006   p & e repairs with cardinal uterosatral coloposusension (pelvic area repair)  03/03/09   severe dysplasia near rectum  03/18/2013   tummy tuck  1982   upper colpectomy  09/1993      Current Outpatient Medications  Medication Sig Dispense Refill   Azelastine HCl 0.15 % SOLN 1 spray in each nostril 1-2 times daily as needed. 30 mL 5   fexofenadine (ALLEGRA ALLERGY) 60 MG tablet Take 1 tablet (60 mg total) by mouth 2 (two) times daily. 60 tablet 5   folic acid (FOLVITE) 1 MG tablet Take 1 mg by mouth daily.     methotrexate 2.5 MG tablet Take 15 mg by mouth once a week.     predniSONE (DELTASONE) 10 MG tablet Take by mouth.     ranitidine (ZANTAC) 150 MG capsule Take 1 capsule (150 mg total) by mouth 2 (two) times daily. 60 capsule 5   Respiratory Therapy Supplies (FLUTTER) DEVI Use as directed 1 each 0   SYNTHROID 50 MCG tablet Take by mouth.     No current facility-administered medications for this visit.    Allergies:   Colesevelam and Statins     ROS:  Please see the history of present illness.   Otherwise, review of systems are positive for none.   All other systems are reviewed and negative.    PHYSICAL EXAM: VS:  BP 140/60   Pulse 79   Ht '5\' 3"'$  (1.6 m)   Wt 123 lb (55.8 kg)  SpO2 93%   BMI 21.79 kg/m  , BMI Body mass index is 21.79 kg/m. GENERAL:  Well appearing NECK:  No jugular venous distention, waveform within normal limits, carotid upstroke brisk and symmetric, no bruits, no thyromegaly LUNGS:  Clear to auscultation bilaterally CHEST:  Unremarkable HEART:  PMI not displaced or sustained,S1 and S2 within normal limits, no S3, no S4, no clicks, no rubs, no murmurs ABD:  Flat, positive bowel sounds normal in frequency in pitch, no bruits, no rebound, no guarding, no midline pulsatile mass, no hepatomegaly, no splenomegaly EXT:  2 plus pulses throughout, no edema, no cyanosis no clubbing   EKG:  EKG is  ordered today. The ekg ordered today demonstrates sinus rhythm, rate 79, axis within normal limits, intervals within normal limits, no acute ST-T wave changes.   Recent Labs: No results found for requested labs within last 365 days.     Lipid Panel    Component Value Date/Time   CHOL 149 11/15/2015 0901   TRIG 117 11/15/2015 0901   HDL 47 11/15/2015 0901   CHOLHDL 3.2 11/15/2015 0901   VLDL 23 11/15/2015 0901   LDLCALC 79 11/15/2015 0901      Wt Readings from Last 3 Encounters:  04/05/22 123 lb (55.8 kg)  09/21/21 134 lb 12.8 oz (61.1 kg)  08/13/17 133 lb (60.3 kg)    Cardiac cath 2017  Intervention   Other studies Reviewed: Additional studies/ records that were reviewed today include: Labs. Review of the above records demonstrates:  Please see elsewhere in the note.     ASSESSMENT AND PLAN:  CAD:   Given the shortness of breath and her previous nonobstructive coronary disease I will screen her with a stress test.  She says she would not be able to walk on a treadmill so we will do a Lexiscan Myoview.   DYSLIPIDEMIA:   LDL was 143.  She would not take a statin as she says she has not tolerated.  I tried to refer her for PCSK9 inhibitor initiation but she does not want to start this.  She said she might consider it if her stress test is abnormal.   HTN: Her blood pressure is elevated but she says at home it was in the 1 teens systolic.  She has known medical therapy.  She says she has some whitecoat hypertension.  She will watch it at home.   Current medicines are reviewed at length with the patient today.  The patient does not have concerns regarding medicines.  The following changes have been made:  None  Labs/ tests ordered today include:   Orders Placed This Encounter  Procedures   MYOCARDIAL PERFUSION IMAGING   EKG 12-Lead      Disposition:   FU with 12 months   Signed, Minus Breeding, MD  04/05/2022 12:37 PM    [place]

## 2022-04-05 ENCOUNTER — Ambulatory Visit: Payer: PPO | Admitting: Cardiology

## 2022-04-05 ENCOUNTER — Encounter: Payer: Self-pay | Admitting: Cardiology

## 2022-04-05 VITALS — BP 140/60 | HR 79 | Ht 63.0 in | Wt 123.0 lb

## 2022-04-05 DIAGNOSIS — I2510 Atherosclerotic heart disease of native coronary artery without angina pectoris: Secondary | ICD-10-CM

## 2022-04-05 DIAGNOSIS — E785 Hyperlipidemia, unspecified: Secondary | ICD-10-CM

## 2022-04-05 DIAGNOSIS — I10 Essential (primary) hypertension: Secondary | ICD-10-CM

## 2022-04-05 DIAGNOSIS — R0602 Shortness of breath: Secondary | ICD-10-CM | POA: Diagnosis not present

## 2022-04-05 NOTE — Patient Instructions (Signed)
Medication Instructions:  Your Physician recommend you continue on your current medication as directed.    *If you need a refill on your cardiac medications before your next appointment, please call your pharmacy*   Lab Work: None ordered today   Testing/Procedures: Your physician has requested that you have a lexiscan myoview. For further information please visit [email or web address]. Please follow instruction sheet, as given. [place]. [address]   Follow-Up: At [facility], you and your health needs are our priority.  As part of our continuing mission to provide you with exceptional heart care, we have created designated Provider Care Teams.  These Care Teams include your primary Cardiologist (physician) and Advanced Practice Providers (APPs -  Physician Assistants and Nurse Practitioners) who all work together to provide you with the care you need, when you need it.  We recommend signing up for the patient portal called "MyChart".  Sign up information is provided on this After Visit Summary.  MyChart is used to connect with patients for Virtual Visits (Telemedicine).  Patients are able to view lab/test results, encounter notes, upcoming appointments, etc.  Non-urgent messages can be sent to your provider as well.   To learn more about what you can do with MyChart, go to [email or web address].    Your next appointment:   1 year(s)  The format for your next appointment:   In Person  Provider:   Minus Breeding, MD {  You are scheduled for a Myocardial Perfusion Imaging Study. Please arrive 15 minutes prior to your appointment time for registration and insurance purposes.  The test will take approximately 3 to 4 hours to complete; you may bring reading material.  If someone comes with you to your appointment, they will need to remain in the main lobby due to limited space in the testing area. **If you are pregnant or breastfeeding, please notify the nuclear lab prior to  your appointment**  How to prepare for your Myocardial Perfusion Test: Do not eat or drink 3 hours prior to your test, except you may have water. Do not consume products containing caffeine (regular or decaffeinated) 12 hours prior to your test. (ex: coffee, chocolate, sodas, tea). Do bring a list of your current medications with you.  If not listed below, you may take your medications as normal. Do wear comfortable clothes (no dresses or overalls) and walking shoes, tennis shoes preferred (No heels or open toe shoes are allowed). Do NOT wear cologne, perfume, aftershave, or lotions (deodorant is allowed). If these instructions are not followed, your test will have to be rescheduled.  Please report to [place], [address] for your test.  If you have questions or concerns about your appointment, you can call the Nuclear Lab at [phone].  If you cannot keep your appointment, please provide 24 hours notification to the Nuclear Lab, to avoid a possible $50 charge to your account.

## 2022-04-06 ENCOUNTER — Encounter: Payer: Self-pay | Admitting: Cardiology

## 2022-04-20 ENCOUNTER — Telehealth (HOSPITAL_COMMUNITY): Payer: Self-pay | Admitting: *Deleted

## 2022-04-20 ENCOUNTER — Ambulatory Visit
Admission: RE | Admit: 2022-04-20 | Discharge: 2022-04-20 | Disposition: A | Discharge status: Home / Self Care | Payer: PPO | Source: Ambulatory Visit | Attending: Rheumatology | Admitting: Rheumatology

## 2022-04-20 DIAGNOSIS — J849 Interstitial pulmonary disease, unspecified: Secondary | ICD-10-CM | POA: Diagnosis not present

## 2022-04-20 DIAGNOSIS — K429 Umbilical hernia without obstruction or gangrene: Secondary | ICD-10-CM | POA: Diagnosis not present

## 2022-04-20 DIAGNOSIS — S3210XA Unspecified fracture of sacrum, initial encounter for closed fracture: Secondary | ICD-10-CM | POA: Diagnosis not present

## 2022-04-20 DIAGNOSIS — K5730 Diverticulosis of large intestine without perforation or abscess without bleeding: Secondary | ICD-10-CM | POA: Diagnosis not present

## 2022-04-20 DIAGNOSIS — I700 Atherosclerosis of aorta: Secondary | ICD-10-CM | POA: Diagnosis not present

## 2022-04-20 DIAGNOSIS — M3390 Dermatopolymyositis, unspecified, organ involvement unspecified: Secondary | ICD-10-CM

## 2022-04-20 DIAGNOSIS — R634 Abnormal weight loss: Secondary | ICD-10-CM

## 2022-04-20 DIAGNOSIS — S32512A Fracture of superior rim of left pubis, initial encounter for closed fracture: Secondary | ICD-10-CM | POA: Diagnosis not present

## 2022-04-20 NOTE — Telephone Encounter (Signed)
Close encounter 

## 2022-04-21 ENCOUNTER — Encounter: Payer: Self-pay | Admitting: Cardiology

## 2022-04-24 ENCOUNTER — Other Ambulatory Visit (HOSPITAL_COMMUNITY): Payer: Self-pay | Admitting: Cardiology

## 2022-04-24 DIAGNOSIS — I2510 Atherosclerotic heart disease of native coronary artery without angina pectoris: Secondary | ICD-10-CM

## 2022-04-25 ENCOUNTER — Ambulatory Visit (HOSPITAL_COMMUNITY): Payer: PPO | Attending: Cardiology

## 2022-04-25 ENCOUNTER — Ambulatory Visit (HOSPITAL_COMMUNITY)
Admission: RE | Admit: 2022-04-25 | Payer: PPO | Source: Ambulatory Visit | Attending: Cardiology | Admitting: Cardiology

## 2022-04-25 DIAGNOSIS — I2510 Atherosclerotic heart disease of native coronary artery without angina pectoris: Secondary | ICD-10-CM | POA: Diagnosis not present

## 2022-04-25 LAB — MYOCARDIAL PERFUSION IMAGING
LV dias vol: 43 mL (ref 46–106)
LV sys vol: 12 mL
Nuc Stress EF: 71 %
Peak HR: 112 {beats}/min
Rest HR: 69 {beats}/min
Rest Nuclear Isotope Dose: 10.8 mCi
SDS: 1
SRS: 1
SSS: 2
ST Depression (mm): 0 mm
Stress Nuclear Isotope Dose: 30.7 mCi
TID: 0.95

## 2022-04-25 MED ORDER — TECHNETIUM TC 99M TETROFOSMIN IV KIT
30.7000 | PACK | Freq: Once | INTRAVENOUS | Status: AC | PRN
  Administered 2022-04-25: 30.7 via INTRAVENOUS

## 2022-04-25 MED ORDER — TECHNETIUM TC 99M TETROFOSMIN IV KIT
10.8000 | PACK | Freq: Once | INTRAVENOUS | Status: AC | PRN
  Administered 2022-04-25: 10.8 via INTRAVENOUS

## 2022-04-25 MED ORDER — REGADENOSON 0.4 MG/5ML IV SOLN
0.4000 mg | Freq: Once | INTRAVENOUS | Status: AC
  Administered 2022-04-25: 0.4 mg via INTRAVENOUS

## 2022-05-22 ENCOUNTER — Ambulatory Visit: Payer: PPO | Admitting: Emergency Medicine

## 2022-05-22 ENCOUNTER — Encounter: Payer: Self-pay | Admitting: Emergency Medicine

## 2022-05-22 VITALS — BP 116/68 | HR 82 | Temp 98.3°F | Ht 63.0 in | Wt 121.8 lb

## 2022-05-22 DIAGNOSIS — J849 Interstitial pulmonary disease, unspecified: Secondary | ICD-10-CM | POA: Diagnosis not present

## 2022-05-22 DIAGNOSIS — R053 Chronic cough: Secondary | ICD-10-CM | POA: Diagnosis not present

## 2022-05-22 MED ORDER — PANTOPRAZOLE SODIUM 40 MG PO TBEC: 40.0000 mg | DELAYED_RELEASE_TABLET | Freq: Two times a day (BID) | ORAL | 3 refills | Status: DC

## 2022-05-22 NOTE — Progress Notes (Signed)
Subjective:    Patient ID: Susan Bray, female    DOB: 25-Jul-1946, 76 y.o.   MRN: [id number]  HPI 76 year old woman, never smoker, with a history of CAD, hyperlipidemia, IBS, VIN-III.  She is been experiencing some lightheadedness, some exertional dyspnea.  A cardiac stress test was done in early July that was reassuring without any evidence of active CAD.  She also had a CT scan of the chest 04/20/2022 as below  She describes exertional SOB that began to evolve Fall 2022, has limited her activities - difficulty w stairs. 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 She has also developed a progressive cough, non-productive over about the same interval. Cough happens at any time, does not wake her form sleep. No hoarseness or dysphonia. Tried allegra, now off. She has some daily reflux sx - has zantac on her med list but she does not take it. The Ct chest was prompted by her dyspnea. She has had swelling and arthralgias that was new beginning Fall 2022, particularly hands. She was treated w prednisone, still on '10mg'$  qd. Her sx did improve some. Has also been tried on MTX, was on for 2-3 months and then stopped. Was just started on azathioprine. She had had auto-immune labs done, but we don't know results.   CT chest 04/20/2022 reviewed by me shows evidence for CAD, no mediastinal or hilar adenopathy, peripheral reticulated interstitial change with a basal gradient and some subpleural sparing.  Associated patchy bronchiolectasis with occasional areas of honeycomb change, some mild peripheral cystic disease   Review of Systems As per HPi  Past Medical History:  Diagnosis Date   CAD (coronary artery disease)    Cataract    Hyperlipidemia    IBS (irritable bowel syndrome)    VIN III (vulvar intraepithelial neoplasia III)      Family History  Problem Relation Age of Onset   Arthritis Mother    Cancer Mother        breast   Hypertension Mother    Heart attack Mother 38   Breast cancer Mother 55    Heart attack Father 60   Heart disease Maternal Grandmother    CAD Brother 19   Peripheral vascular disease Sister 72     Social History   Socioeconomic History   Marital status: Single    Spouse name: Not on file   Number of children: 3   Years of education: Not on file   Highest education level: Not on file  Occupational History   Not on file  Tobacco Use   Smoking status: Never   Smokeless tobacco: Never  Vaping Use   Vaping Use: Never used  Substance and Sexual Activity   Alcohol use: No    Alcohol/week: 0.0 standard drinks of alcohol   Drug use: No   Sexual activity: Not on file  Other Topics Concern   Not on file  Social History Narrative   Married   Retired   3 pregnancies   3 live births   8-9 hours of sleep a night    Lives with son and two grandchildren   Some college.    Social Determinants of Health   Financial Resource Strain: Not on file  Food Insecurity: Not on file  Transportation Needs: Not on file  Physical Activity: Not on file  Stress: Not on file  Social Connections: Not on file  Intimate Partner Violence: Not on file    From [place] work No exposures Never birds  No hot tub.   Allergies  Allergen Reactions   Colesevelam Other (See Comments)    Muscle and nerve pain/ damage in her arm    Statins Other (See Comments)    Muscle aches and fatigue.      Outpatient Medications Prior to Visit  Medication Sig Dispense Refill   Azelastine HCl 0.15 % SOLN 1 spray in each nostril 1-2 times daily as needed. 30 mL 5   folic acid (FOLVITE) 1 MG tablet Take 1 mg by mouth daily.     predniSONE (DELTASONE) 10 MG tablet Take by mouth.     ranitidine (ZANTAC) 150 MG capsule Take 1 capsule (150 mg total) by mouth 2 (two) times daily. 60 capsule 5   thyroid (ARMOUR THYROID) 60 MG tablet 1 tablet on an empty stomach, Alternating '50mg'$  Synthroid     fexofenadine (ALLEGRA ALLERGY) 60 MG tablet Take 1 tablet (60 mg total) by mouth 2 (two) times daily.  (Patient not taking: Reported on 05/22/2022) 60 tablet 5   methotrexate 2.5 MG tablet Take 15 mg by mouth once a week. (Patient not taking: Reported on 05/22/2022)     Respiratory Therapy Supplies (FLUTTER) DEVI Use as directed 1 each 0   SYNTHROID 50 MCG tablet Take by mouth. (Patient not taking: Reported on 05/22/2022)     No facility-administered medications prior to visit.         Objective:   Physical Exam Vitals:   05/22/22 0945  BP: 116/68  Pulse: 82  Temp: 98.3 F (36.8 C)  TempSrc: Oral  SpO2: 97%  Weight: 121 lb 12.8 oz (55.2 kg)  Height: '5\' 3"'$  (1.6 m)   Gen: Pleasant, well-nourished, in no distress,  normal affect  ENT: No lesions,  mouth clear,  oropharynx clear, no postnasal drip  Neck: No JVD, no stridor  Lungs: No use of accessory muscles, Bilateral basilar fine crackles  Cardiovascular: RRR, heart sounds normal, no murmur or gallops, no peripheral edema  Musculoskeletal: No deformities, no cyanosis or clubbing  Neuro: alert, awake, non focal  Skin: Warm, no lesions or rash      Assessment & Plan:  ILD (interstitial lung disease) ([place]) Etiology unclear, but she has been evaluated for and treated for possible RA - first MTX, now azathioprine + pred. Certainly could be auto-immune related ILD. We will check High res CT chest, resend full panel of auto-immune labs. I will arrange for her to see our ILD clinic to continue the evaluation.   We will arrange for high-resolution CT scan of the chest to evaluate interstitial lung disease We will repeat your lab work today We will arrange for pulmonary function testing Continue prednisone and azathioprine for now as directed by Dr Kathlene November We will arrange follow-up with Dr. Vaughan Browner in our interstitial lung disease clinic to review your testing.  Cough, persistent Could relate to the ILD, chronic interstitial inflammation. Consider also breakthrough GERD, rhinitis.   Please try starting pantoprazole 40 mg twice a day  for the next 3 weeks.  Take this medication 1 hour around food.  Hopefully this will help suppress acid reflux and allow your cough to decrease   Baltazar Apo, MD, PhD 06/14/2022, 8:09 PM [facility] [phone] or if no answer before 7:00PM call [phone] For any issues after 7:00PM please call eLink [phone]

## 2022-05-22 NOTE — Patient Instructions (Signed)
We will arrange for high-resolution CT scan of the chest to evaluate interstitial lung disease We will repeat your lab work today We will arrange for pulmonary function testing Please try starting pantoprazole 40 mg twice a day for the next 3 weeks.  Take this medication 1 hour around food.  Hopefully this will help suppress acid reflux and allow your cough to decrease Continue prednisone and azathioprine for now as directed by Dr Kathlene November We will arrange follow-up with Dr. Vaughan Browner in our interstitial lung disease clinic to review your testing.

## 2022-05-24 LAB — SJOGREN'S SYNDROME ANTIBODS(SSA + SSB)
SSA (Ro) (ENA) Antibody, IgG: 6.3 AI — AB
SSB (La) (ENA) Antibody, IgG: <1 AI

## 2022-05-24 LAB — ANA,IFA RA DIAG PNL W/RFLX TIT/PATN
Anti Nuclear Antibody (ANA): POSITIVE — AB
Cyclic Citrullin Peptide Ab: <16 UNITS
Rheumatoid fact SerPl-aCnc: <14 IU/mL (ref <14)

## 2022-05-24 LAB — ANTI-NUCLEAR AB-TITER (ANA TITER)
ANA TITER: 1:80 {titer} — ABNORMAL HIGH
ANA Titer 1: 1:320 {titer} — ABNORMAL HIGH

## 2022-05-24 LAB — ANTI-SCLERODERMA ANTIBODY: Scleroderma (Scl-70) (ENA) Antibody, IgG: <1 AI

## 2022-05-28 DIAGNOSIS — R21 Rash and other nonspecific skin eruption: Secondary | ICD-10-CM | POA: Diagnosis not present

## 2022-05-28 DIAGNOSIS — Z13820 Encounter for screening for osteoporosis: Secondary | ICD-10-CM | POA: Diagnosis not present

## 2022-05-28 DIAGNOSIS — R682 Dry mouth, unspecified: Secondary | ICD-10-CM | POA: Diagnosis not present

## 2022-05-28 DIAGNOSIS — Z79899 Other long term (current) drug therapy: Secondary | ICD-10-CM | POA: Diagnosis not present

## 2022-05-28 DIAGNOSIS — H04129 Dry eye syndrome of unspecified lacrimal gland: Secondary | ICD-10-CM | POA: Diagnosis not present

## 2022-05-28 DIAGNOSIS — J849 Interstitial pulmonary disease, unspecified: Secondary | ICD-10-CM | POA: Diagnosis not present

## 2022-05-28 DIAGNOSIS — M3500 Sicca syndrome, unspecified: Secondary | ICD-10-CM | POA: Diagnosis not present

## 2022-05-28 DIAGNOSIS — M810 Age-related osteoporosis without current pathological fracture: Secondary | ICD-10-CM | POA: Diagnosis not present

## 2022-05-28 DIAGNOSIS — M1990 Unspecified osteoarthritis, unspecified site: Secondary | ICD-10-CM | POA: Diagnosis not present

## 2022-05-28 DIAGNOSIS — R634 Abnormal weight loss: Secondary | ICD-10-CM | POA: Diagnosis not present

## 2022-05-28 DIAGNOSIS — M3390 Dermatopolymyositis, unspecified, organ involvement unspecified: Secondary | ICD-10-CM | POA: Diagnosis not present

## 2022-05-28 LAB — C ANCA  REFLEX TITER: C ANCA TITER: 1:40 {titer} — ABNORMAL HIGH

## 2022-05-28 LAB — ANCA SCREEN W REFLEX TITER: ANCA SCREEN: POSITIVE — AB

## 2022-05-29 LAB — HYPERSENSITIVITY PNEUMONITIS
A. Pullulans Abs: NEGATIVE
A.Fumigatus #1 Abs: NEGATIVE
Micropolyspora faeni, IgG: NEGATIVE
Pigeon Serum Abs: NEGATIVE
Thermoact. Saccharii: NEGATIVE
Thermoactinomyces vulgaris, IgG: NEGATIVE

## 2022-05-31 ENCOUNTER — Ambulatory Visit (HOSPITAL_BASED_OUTPATIENT_CLINIC_OR_DEPARTMENT_OTHER)
Admission: RE | Admit: 2022-05-31 | Discharge: 2022-05-31 | Disposition: A | Discharge status: Home / Self Care | Payer: PPO | Source: Ambulatory Visit | Attending: Emergency Medicine | Admitting: Emergency Medicine

## 2022-05-31 ENCOUNTER — Encounter (HOSPITAL_BASED_OUTPATIENT_CLINIC_OR_DEPARTMENT_OTHER): Payer: Self-pay

## 2022-05-31 DIAGNOSIS — J849 Interstitial pulmonary disease, unspecified: Secondary | ICD-10-CM | POA: Diagnosis not present

## 2022-05-31 DIAGNOSIS — R911 Solitary pulmonary nodule: Secondary | ICD-10-CM | POA: Diagnosis not present

## 2022-06-08 LAB — MYOMARKER 3 PLUS PROFILE (RDL)
Anti-EJ Ab (RDL): NEGATIVE
Anti-Jo-1 Ab (RDL): <20 Units (ref <20)
Anti-Ku Ab (RDL): NEGATIVE
Anti-MDA-5 Ab (CADM-140)(RDL): <20 Units (ref <20)
Anti-Mi-2 Ab (RDL): NEGATIVE
Anti-NXP-2 (P140) Ab (RDL): 178 Units — ABNORMAL HIGH (ref <20)
Anti-OJ Ab (RDL): NEGATIVE
Anti-PL-12 Ab (RDL: POSITIVE — AB
Anti-PL-7 Ab (RDL): NEGATIVE
Anti-PM/Scl-100 Ab (RDL): <20 Units (ref <20)
Anti-SAE1 Ab, IgG (RDL): <20 Units (ref <20)
Anti-SRP Ab (RDL): NEGATIVE
Anti-SS-A 52kD Ab, IgG (RDL): 185 Units — ABNORMAL HIGH (ref <20)
Anti-TIF-1gamma Ab (RDL): <20 Units (ref <20)
Anti-U1 RNP Ab (RDL): <20 Units (ref <20)
Anti-U2 RNP Ab (RDL): NEGATIVE
Anti-U3 RNP (Fibrillarin)(RDL): NEGATIVE

## 2022-06-14 DIAGNOSIS — J849 Interstitial pulmonary disease, unspecified: Secondary | ICD-10-CM | POA: Insufficient documentation

## 2022-06-14 NOTE — Assessment & Plan Note (Signed)
Could relate to the ILD, chronic interstitial inflammation. Consider also breakthrough GERD, rhinitis.   Please try starting pantoprazole 40 mg twice a day for the next 3 weeks.  Take this medication 1 hour around food.  Hopefully this will help suppress acid reflux and allow your cough to decrease

## 2022-06-14 NOTE — Assessment & Plan Note (Addendum)
Etiology unclear, but she has been evaluated for and treated for possible RA - first MTX, now azathioprine + pred. Certainly could be auto-immune related ILD. We will check High res CT chest, resend full panel of auto-immune labs. I will arrange for her to see our ILD clinic to continue the evaluation.   We will arrange for high-resolution CT scan of the chest to evaluate interstitial lung disease We will repeat your lab work today We will arrange for pulmonary function testing Continue prednisone and azathioprine for now as directed by Dr Kathlene November We will arrange follow-up with Dr. Vaughan Browner in our interstitial lung disease clinic to review your testing.

## 2022-06-18 ENCOUNTER — Ambulatory Visit (INDEPENDENT_AMBULATORY_CARE_PROVIDER_SITE_OTHER): Payer: PPO | Admitting: Emergency Medicine

## 2022-06-18 DIAGNOSIS — J849 Interstitial pulmonary disease, unspecified: Secondary | ICD-10-CM

## 2022-06-18 LAB — PULMONARY FUNCTION TEST
DL/VA % pred: 111 %
DL/VA: 4.62 ml/min/mmHg/L
DLCO cor % pred: 91 %
DLCO cor: 16.88 ml/min/mmHg
DLCO unc % pred: 91 %
DLCO unc: 16.88 ml/min/mmHg
FEF 25-75 Post: 2.35 L/sec
FEF 25-75 Pre: 2.17 L/sec
FEF2575-%Change-Post: 8 %
FEF2575-%Pred-Post: 142 %
FEF2575-%Pred-Pre: 131 %
FEV1-%Change-Post: 6 %
FEV1-%Pred-Post: 94 %
FEV1-%Pred-Pre: 89 %
FEV1-Post: 1.94 L
FEV1-Pre: 1.82 L
FEV1FVC-%Change-Post: 5 %
FEV1FVC-%Pred-Pre: 112 %
FEV6-%Change-Post: 1 %
FEV6-%Pred-Post: 83 %
FEV6-%Pred-Pre: 82 %
FEV6-Post: 2.17 L
FEV6-Pre: 2.15 L
FEV6FVC-%Change-Post: 0 %
FEV6FVC-%Pred-Post: 105 %
FEV6FVC-%Pred-Pre: 104 %
FVC-%Change-Post: 1 %
FVC-%Pred-Post: 79 %
FVC-%Pred-Pre: 78 %
FVC-Post: 2.17 L
FVC-Pre: 2.15 L
Post FEV1/FVC ratio: 89 %
Post FEV6/FVC ratio: 100 %
Pre FEV1/FVC ratio: 85 %
Pre FEV6/FVC Ratio: 100 %
RV % pred: 27 %
RV: 0.62 L
TLC % pred: 61 %
TLC: 3.04 L

## 2022-06-18 NOTE — Patient Instructions (Signed)
Full PFT performed today. °

## 2022-06-18 NOTE — Progress Notes (Signed)
Full PFT performed today. °

## 2022-06-20 ENCOUNTER — Ambulatory Visit: Payer: PPO | Admitting: Pulmonary Disease

## 2022-06-20 ENCOUNTER — Encounter: Payer: Self-pay | Admitting: Pulmonary Disease

## 2022-06-20 VITALS — BP 114/60 | HR 77 | Temp 98.2°F | Ht 63.0 in | Wt 121.0 lb

## 2022-06-20 DIAGNOSIS — J849 Interstitial pulmonary disease, unspecified: Secondary | ICD-10-CM

## 2022-06-20 DIAGNOSIS — R053 Chronic cough: Secondary | ICD-10-CM | POA: Diagnosis not present

## 2022-06-20 MED ORDER — AZATHIOPRINE 50 MG PO TABS: ORAL_TABLET | ORAL | 5 refills | Status: DC

## 2022-06-20 NOTE — Patient Instructions (Signed)
We will send in a prescription for azathioprine 100 mg a day He CT and lung function test showed a condition called interstitial lung disease which is another term for inflammation and scarring in the lung I will get records from Dr. Kathlene November for review Follow-up in clinic in 3 months.

## 2022-06-20 NOTE — Progress Notes (Signed)
Susan Bray    [id number]    May 14, 1946  Primary Care Physician:Hammer, Geryl Rankins, MD  Referring Physician: Collene Leyden, MD [address] [place],  [place] [address]  Chief complaint: Consult for interstitial lung disease  HPI: 76 y.o. who  has a past medical history of CAD (coronary artery disease), Cataract, Hyperlipidemia, IBS (irritable bowel syndrome), and VIN III (vulvar intraepithelial neoplasia III).   She has Sjogren's syndrome and dermatomyositis with initial complaints of skin thickening, joint stiffness, dyspnea and cough.  Diagnosis made in June 2023 by Dr. Kathlene November.  Initially tried on prednisone and methotrexate.  Methotrexate was stopped for unclear reason.  Continues on prednisone at 10 mg.  She was started on azathioprine 50 mg/day in July 2023 and this was increased to 100 mg/day in August 2023.  Her symptoms have improved with the treatment  Imaging shows interstitial lung disease and she has been referred here for further evaluation  Pets: No pets Occupation: Retired Optometrist Exposures: No mold, hot tub, Jacuzzi.  No feather pillows or comforters Smoking history: Never smoker Travel history: Significant travel history Relevant family history: No family history of lung disease   Outpatient Encounter Medications as of 06/20/2022  Medication Sig   predniSONE (DELTASONE) 10 MG tablet Take by mouth.   SYNTHROID 50 MCG tablet Take by mouth.   thyroid (ARMOUR THYROID) 60 MG tablet 1 tablet on an empty stomach, Alternating '50mg'$  Synthroid   [DISCONTINUED] Azelastine HCl 0.15 % SOLN 1 spray in each nostril 1-2 times daily as needed.   [DISCONTINUED] fexofenadine (ALLEGRA ALLERGY) 60 MG tablet Take 1 tablet (60 mg total) by mouth 2 (two) times daily. (Patient not taking: Reported on 05/22/2022)   [DISCONTINUED] folic acid (FOLVITE) 1 MG tablet Take 1 mg by mouth daily.   [DISCONTINUED] methotrexate 2.5 MG tablet Take 15 mg by mouth once a week. (Patient not  taking: Reported on 05/22/2022)   [DISCONTINUED] pantoprazole (PROTONIX) 40 MG tablet Take 1 tablet (40 mg total) by mouth 2 (two) times daily.   [DISCONTINUED] ranitidine (ZANTAC) 150 MG capsule Take 1 capsule (150 mg total) by mouth 2 (two) times daily.   [DISCONTINUED] Respiratory Therapy Supplies (FLUTTER) DEVI Use as directed   No facility-administered encounter medications on file as of 06/20/2022.    Allergies as of 06/20/2022 - Review Complete 05/22/2022  Allergen Reaction Noted   Colesevelam Other (See Comments) 09/21/2021   Statins Other (See Comments) 09/21/2021    Past Medical History:  Diagnosis Date   CAD (coronary artery disease)    Cataract    Hyperlipidemia    IBS (irritable bowel syndrome)    VIN III (vulvar intraepithelial neoplasia III)     Past Surgical History:  Procedure Laterality Date   ABDOMINAL HYSTERECTOMY  1978   CARDIAC CATHETERIZATION N/A 12/06/2015   Procedure: Left Heart Cath and Coronary Angiography;  Surgeon: Belva Crome, MD;  Location: [facility];  Service: Cardiovascular;  Laterality: N/A;   CATARACT EXTRACTION  08/17/04 ; 09/12/2004   left eye and right eye   CATARACT EXTRACTION     laser vaporization of hg vaginal dysplasia  12/2006   p & e repairs with cardinal uterosatral coloposusension (pelvic area repair)  03/03/09   severe dysplasia near rectum  03/18/2013   tummy tuck  1982   upper colpectomy  09/1993    Family History  Problem Relation Age of Onset   Arthritis Mother  Cancer Mother        breast   Hypertension Mother    Heart attack Mother 18   Breast cancer Mother 57   Heart attack Father 43   Heart disease Maternal Grandmother    CAD Brother 35   Peripheral vascular disease Sister 75    Social History   Socioeconomic History   Marital status: Single    Spouse name: Not on file   Number of children: 3   Years of education: Not on file   Highest education level: Not on file  Occupational History   Not  on file  Tobacco Use   Smoking status: Never   Smokeless tobacco: Never  Vaping Use   Vaping Use: Never used  Substance and Sexual Activity   Alcohol use: No    Alcohol/week: 0.0 standard drinks of alcohol   Drug use: No   Sexual activity: Not on file  Other Topics Concern   Not on file  Social History Narrative   Married   Retired   3 pregnancies   3 live births   8-9 hours of sleep a night    Lives with son and two grandchildren   Some college.    Social Determinants of Health   Financial Resource Strain: Not on file  Food Insecurity: Not on file  Transportation Needs: Not on file  Physical Activity: Not on file  Stress: Not on file  Social Connections: Not on file  Intimate Partner Violence: Not on file    Review of systems: Review of Systems  Constitutional: Negative for fever and chills.  HENT: Negative.   Eyes: Negative for blurred vision.  Respiratory: as per HPI  Cardiovascular: Negative for chest pain and palpitations.  Gastrointestinal: Negative for vomiting, diarrhea, blood per rectum. Genitourinary: Negative for dysuria, urgency, frequency and hematuria.  Musculoskeletal: Negative for myalgias, back pain and joint pain.  Skin: Negative for itching and rash.  Neurological: Negative for dizziness, tremors, focal weakness, seizures and loss of consciousness.  Endo/Heme/Allergies: Negative for environmental allergies.  Psychiatric/Behavioral: Negative for depression, suicidal ideas and hallucinations.  All other systems reviewed and are negative.  Physical Exam: Blood pressure 114/60, pulse 77, temperature 98.2 F (36.8 C), temperature source Oral, height '5\' 3"'$  (1.6 m), weight 121 lb (54.9 kg), SpO2 97 %. Gen:      No acute distress HEENT:  EOMI, sclera anicteric Neck:     No masses; no thyromegaly Lungs:    Clear to auscultation bilaterally; normal respiratory effort CV:         Regular rate and rhythm; no murmurs Abd:      + bowel sounds; soft,  non-tender; no palpable masses, no distension Ext:    No edema; adequate peripheral perfusion Skin:      Warm and dry; no rash Neuro: alert and oriented x 3 Psych: normal mood and affect  Data Reviewed: Imaging: Cardiac CT 10/19/2015-visualized lung shows fibrotic changes with groundglass and traction bronchiectasis.  High-resolution CT 05/31/2022-basilar predominant fibrotic disease with groundglass opacities, traction bronchiectasis.  Alternate diagnosis suggestive of fibrotic NSIP I have reviewed the images personally  PFTs: 06/10/2022 FVC 2.17 [79%], FEV1 1.94 [94%], F/F 89, TLC 3.04 [61%], DLCO 16.88 [91%] Moderate restriction  Labs:  Assessment:  Interstitial lung disease Likely related to her diagnosis of Sjogren's and dermatomyositis.  The pattern appears to be alternate suggestive of fibrotic NSIP.  This was noted as early as 2016 on a cardiac CT and appears largely unchanged.  PFTs show  moderate restriction  She is on azathioprine from her rheumatologist which is appropriate for interstitial lung disease as well.  This was apparently not covered by her insurance with a rheumatologic diagnosis.  We will send in a prescription with a diagnosis of interstitial lung disease to see if insurance would cover Get records from Dr. Kathlene November for review Follow-up in 3 months  Plan/Recommendations: Continue azathioprine Send a new prescription with diagnosis of ILD Get rheumatology records Follow-up in 3 months  Marshell Garfinkel MD [facility] 06/20/2022, 2:50 PM  CC: Collene Leyden, MD

## 2022-06-21 ENCOUNTER — Telehealth: Payer: Self-pay

## 2022-06-21 ENCOUNTER — Other Ambulatory Visit (HOSPITAL_COMMUNITY): Payer: Self-pay

## 2022-06-21 NOTE — Telephone Encounter (Signed)
Patient Advocate Encounter   Received notification that prior authorization is required for Azathioprine '50mg'$  tablet  Submitted: 06-21-2022 Key [id number]

## 2022-06-21 NOTE — Telephone Encounter (Signed)
Received notification for Prior Authorization for Azathioprine '50mg'$  tab  Unable to find associated diagnosis code on chart; confirming with provider.   PA not submitted at this time.

## 2022-06-21 NOTE — Telephone Encounter (Signed)
Patient was seen in office 06/20/22 and prescribed Azathioprine '100mg'$ , but patient requested '50mg'$  tabs.  She has a diagnosis of ILD J84.9.

## 2022-06-22 ENCOUNTER — Other Ambulatory Visit (HOSPITAL_COMMUNITY): Payer: Self-pay

## 2022-06-22 NOTE — Telephone Encounter (Signed)
Patient Advocate Encounter  Received a fax regarding Prior Authorization for Azathioprine $RemoveBeforeDE'50mg'DLOivvHrfBygHpD$  tablet.   Authorization has been DENIED due to criteria not met.  Per insurance, patient must have diagnosis of prophlyaxis of renal transplant rejection OR rheumatoid arthritis.   Denial letter attached to patients chart.

## 2022-06-26 NOTE — Telephone Encounter (Signed)
I called and spoke with patient.  I had looked up GoodRx Azathioprine '50mg'$  tabs #60 at [facility] and cost will be $7.89.  Advised patient to use GoodRx for prescription.  Understanding stated.  Nothing further at this time.

## 2022-07-01 ENCOUNTER — Encounter: Payer: Self-pay | Admitting: Pulmonary Disease

## 2022-07-01 DIAGNOSIS — J849 Interstitial pulmonary disease, unspecified: Secondary | ICD-10-CM

## 2022-07-04 NOTE — Telephone Encounter (Signed)
Mychart message sent by pt: Susan Bray [facility] (supporting Marshell Garfinkel, MD) 3 days ago    After your diagnosis of ILD.Marland KitchenMarland KitchenI realized you did not give me any brochures or tell me how to maintain ILD. I have been researching and trying to get an handle on my situation. It's SCARY.Marland Kitchen What do YOU recommend that I do to maintain this disease...hoping to prevent it from progressing.  My take away from our office visit was... take the Azathioprine and wait to see what another CT scan shows in Dec.   Would you please provide me with an information packet telling me what I need to be doing.. or not doing.. to at least maintain my current  situation...breathing exercises? Oxygen? Vitamins? Physical Therapy? Pulmonary Rehabilitation?  WHAT DO I DO?????     Dr. Vaughan Browner, please advise.

## 2022-07-10 DIAGNOSIS — R87619 Unspecified abnormal cytological findings in specimens from cervix uteri: Secondary | ICD-10-CM | POA: Diagnosis not present

## 2022-07-10 DIAGNOSIS — Z124 Encounter for screening for malignant neoplasm of cervix: Secondary | ICD-10-CM | POA: Diagnosis not present

## 2022-07-10 DIAGNOSIS — N952 Postmenopausal atrophic vaginitis: Secondary | ICD-10-CM | POA: Diagnosis not present

## 2022-07-10 DIAGNOSIS — Z87412 Personal history of vulvar dysplasia: Secondary | ICD-10-CM | POA: Diagnosis not present

## 2022-07-10 DIAGNOSIS — Z01419 Encounter for gynecological examination (general) (routine) without abnormal findings: Secondary | ICD-10-CM | POA: Diagnosis not present

## 2022-07-12 ENCOUNTER — Telehealth: Payer: Self-pay | Admitting: Pulmonary Disease

## 2022-07-12 ENCOUNTER — Encounter: Payer: Self-pay | Admitting: Gastroenterology

## 2022-07-12 DIAGNOSIS — J849 Interstitial pulmonary disease, unspecified: Secondary | ICD-10-CM | POA: Diagnosis not present

## 2022-07-12 DIAGNOSIS — R682 Dry mouth, unspecified: Secondary | ICD-10-CM | POA: Diagnosis not present

## 2022-07-12 DIAGNOSIS — R21 Rash and other nonspecific skin eruption: Secondary | ICD-10-CM | POA: Diagnosis not present

## 2022-07-12 DIAGNOSIS — M3390 Dermatopolymyositis, unspecified, organ involvement unspecified: Secondary | ICD-10-CM | POA: Diagnosis not present

## 2022-07-12 DIAGNOSIS — M810 Age-related osteoporosis without current pathological fracture: Secondary | ICD-10-CM | POA: Diagnosis not present

## 2022-07-12 DIAGNOSIS — Z79899 Other long term (current) drug therapy: Secondary | ICD-10-CM | POA: Diagnosis not present

## 2022-07-12 DIAGNOSIS — H04129 Dry eye syndrome of unspecified lacrimal gland: Secondary | ICD-10-CM | POA: Diagnosis not present

## 2022-07-12 DIAGNOSIS — R634 Abnormal weight loss: Secondary | ICD-10-CM | POA: Diagnosis not present

## 2022-07-12 DIAGNOSIS — M3500 Sicca syndrome, unspecified: Secondary | ICD-10-CM | POA: Diagnosis not present

## 2022-07-12 NOTE — Telephone Encounter (Signed)
Left message for patient to call back  

## 2022-07-12 NOTE — Telephone Encounter (Signed)
Patient called to speak with the doctor regarding her Prednisone medication.  She stated that her rheumatologist wants to reduce her dosage and she wanted to speak with the doctor about it.  Also she stated that she was told that she would be sent some brochures and still have not gotten them yet.  Please advise and call patient to discuss at [phone]

## 2022-07-12 NOTE — Addendum Note (Signed)
Addended by: Elton Sin on: 07/12/2022 03:36 PM   Modules accepted: Orders

## 2022-07-13 ENCOUNTER — Telehealth (HOSPITAL_COMMUNITY): Payer: Self-pay

## 2022-07-13 NOTE — Telephone Encounter (Signed)
See pt email from 9/10.

## 2022-07-13 NOTE — Telephone Encounter (Signed)
Called patient to see if she is interested in the Pulmonary Rehab Program. Patient expressed interest. Explained scheduling process, patient verbalized understanding.  ?

## 2022-07-13 NOTE — Telephone Encounter (Signed)
Dr. Vaughan Browner, please see pt's latest message about her prednisone. Thanks.

## 2022-07-16 NOTE — Telephone Encounter (Signed)
Yes. Agree with reduction in prednisone dose as we reduce the steroids once you are on an agent such as azathioprine.  Can we make a sooner visit to see me in October or November instead of waiting till first week of Dec. Thanks

## 2022-07-19 ENCOUNTER — Encounter (HOSPITAL_COMMUNITY): Payer: Self-pay

## 2022-07-24 ENCOUNTER — Telehealth (HOSPITAL_COMMUNITY): Payer: Self-pay | Admitting: *Deleted

## 2022-07-24 NOTE — Telephone Encounter (Signed)
Sina had reached out the she had not received any information from [place] about her upcoming appointment in the mail and she was concerned that she would not get in time. I called her, she confirmed that she would be coming.We discussed Covid precautions, mask. proper shoes, directions to our department and our contact  number. She voices understanding.

## 2022-07-27 ENCOUNTER — Encounter (HOSPITAL_COMMUNITY): Payer: Self-pay

## 2022-07-27 ENCOUNTER — Encounter (HOSPITAL_COMMUNITY)
Admission: RE | Admit: 2022-07-27 | Discharge: 2022-07-27 | Disposition: A | Discharge status: Home / Self Care | Payer: PPO | Source: Ambulatory Visit | Attending: Pulmonary Disease | Admitting: Pulmonary Disease

## 2022-07-27 VITALS — BP 122/60 | HR 69 | Ht 63.0 in | Wt 122.8 lb

## 2022-07-27 DIAGNOSIS — J849 Interstitial pulmonary disease, unspecified: Secondary | ICD-10-CM | POA: Insufficient documentation

## 2022-07-27 NOTE — Progress Notes (Signed)
Pulmonary Rehab Orientation Physical Assessment Note  Physical assessment reveals  Pt is alert and oriented x 3.  Heart rate is normal, breath sounds clear to auscultation, no wheezes, rales, or rhonchi. Reports non-productive cough. Bowel sounds present.  Pt denies abdominal discomfort, nausea, vomiting or diarrhea. Pt with dull ache in her abdominal area after eating.  To see GI provider later this month.  Grip strength left hand grip weaker than Right - dominant hand. Distal pulses palpable; no swelling to lower extremities. Cherre Huger, BSN Cardiac and Training and development officer

## 2022-07-27 NOTE — Progress Notes (Signed)
Pulmonary Individual Treatment Plan  Patient Details  Name: Susan Bray MRN: [id number] Date of Birth: 07-30-1946 Referring Provider:   [name] Pulmonary Rehab Walk Test from 07/27/2022 in [place]  Referring Provider [name]       Initial Encounter Date:  Flowsheet Row Pulmonary Rehab Walk Test from 07/27/2022 in [place]  Date 07/27/22       Visit Diagnosis: ILD (interstitial lung disease) ([place])  Patient's Home Medications on Admission:   Current Outpatient Medications:    azaTHIOprine (IMURAN) 50 MG tablet, Take 100mg  daily (Patient taking differently: Take 100 mg by mouth daily. Take 100mg  daily), Disp: 60 tablet, Rfl: 5   predniSONE (DELTASONE) 10 MG tablet, Take 5 mg by mouth daily., Disp: , Rfl:    SYNTHROID 50 MCG tablet, Take by mouth., Disp: , Rfl:    thyroid (ARMOUR THYROID) 60 MG tablet, 1 tablet on an empty stomach, Alternating 50mg  Synthroid, Disp: , Rfl:   Past Medical History: Past Medical History:  Diagnosis Date   CAD (coronary artery disease)    Cataract    Hyperlipidemia    IBS (irritable bowel syndrome)    VIN III (vulvar intraepithelial neoplasia III)     Tobacco Use: Social History   Tobacco Use  Smoking Status Never  Smokeless Tobacco Never    Labs: Review Flowsheet       Latest Ref Rng & Units 09/08/2014 11/15/2015  Labs for ITP Cardiac and Pulmonary Rehab  Cholestrol 125 - 200 mg/dL 180  149   LDL (calc) <130 mg/dL 113  79   HDL-C >=46 mg/dL 46.20  47   Trlycerides <150 mg/dL 106.0  117     Capillary Blood Glucose: No results found for: "GLUCAP"   Pulmonary Assessment Scores:  Pulmonary Assessment Scores     Row Name 07/27/22 1102         ADL UCSD   ADL Phase Entry     SOB Score total 53       CAT Score   CAT Score 14       mMRC Score   mMRC Score 3             UCSD: Self-administered rating of dyspnea associated with activities of daily  living (ADLs) 6-point scale (0 = "not at all" to 5 = "maximal or unable to do because of breathlessness")  Scoring Scores range from 0 to 120.  Minimally important difference is 5 units  CAT: CAT can identify the health impairment of COPD patients and is better correlated with disease progression.  CAT has a scoring range of zero to 40. The CAT score is classified into four groups of low (less than 10), medium (10 - 20), high (21-30) and very high (31-40) based on the impact level of disease on health status. A CAT score over 10 suggests significant symptoms.  A worsening CAT score could be explained by an exacerbation, poor medication adherence, poor inhaler technique, or progression of COPD or comorbid conditions.  CAT MCID is 2 points  mMRC: mMRC (Modified Medical Research Council) Dyspnea Scale is used to assess the degree of baseline functional disability in patients of respiratory disease due to dyspnea. No minimal important difference is established. A decrease in score of 1 point or greater is considered a positive change.   Pulmonary Function Assessment:  Pulmonary Function Assessment - 07/27/22 1102       Breath   Bilateral Breath Sounds Clear  Shortness of Breath Yes;Fear of Shortness of Breath;Limiting activity             Exercise Target Goals: Exercise Program Goal: Individual exercise prescription set using results from initial 6 min walk test and THRR while considering  patient's activity barriers and safety.   Exercise Prescription Goal: Initial exercise prescription builds to 30-45 minutes a day of aerobic activity, 2-3 days per week.  Home exercise guidelines will be given to patient during program as part of exercise prescription that the participant will acknowledge.  Activity Barriers & Risk Stratification:  Activity Barriers & Cardiac Risk Stratification - 07/27/22 0925       Activity Barriers & Cardiac Risk Stratification   Activity Barriers  Deconditioning;Muscular Weakness;Shortness of Breath;History of Falls    Cardiac Risk Stratification Moderate             6 Minute Walk:  6 Minute Walk     Row Name 07/27/22 1105         6 Minute Walk   Phase Initial     Distance 1005 feet     Walk Time 6 minutes     # of Rest Breaks 0     MPH 3.07     METS 3.35     RPE 13     Perceived Dyspnea  3     VO2 Peak 11.71     Symptoms No     Resting HR 69 bpm     Resting BP 120/60     Resting Oxygen Saturation  97 %     Exercise Oxygen Saturation  during 6 min walk 93 %     Max Ex. HR 80 bpm     Max Ex. BP 138/60     2 Minute Post BP 124/60       Interval HR   1 Minute HR 77     2 Minute HR 80     3 Minute HR 79     4 Minute HR 76     5 Minute HR 80     6 Minute HR 78     2 Minute Post HR 64     Interval Heart Rate? Yes       Interval Oxygen   Interval Oxygen? Yes     Baseline Oxygen Saturation % 97 %     1 Minute Oxygen Saturation % 97 %     1 Minute Liters of Oxygen 0 L     2 Minute Oxygen Saturation % 99 %     2 Minute Liters of Oxygen 0 L     3 Minute Oxygen Saturation % 93 %     3 Minute Liters of Oxygen 0 L     4 Minute Oxygen Saturation % 98 %     4 Minute Liters of Oxygen 0 L     5 Minute Oxygen Saturation % 96 %     5 Minute Liters of Oxygen 0 L     6 Minute Oxygen Saturation % 97 %     6 Minute Liters of Oxygen 0 L     2 Minute Post Oxygen Saturation % 100 %     2 Minute Post Liters of Oxygen 64 L              Oxygen Initial Assessment:  Oxygen Initial Assessment - 07/27/22 0921       Home Oxygen   Home Oxygen Device None    Sleep Oxygen Prescription  None    Home Exercise Oxygen Prescription None    Home Resting Oxygen Prescription None      Initial 6 min Walk   Oxygen Used None      Program Oxygen Prescription   Program Oxygen Prescription None      Intervention   Short Term Goals To learn and exhibit compliance with exercise, home and travel O2 prescription;To learn and  understand importance of monitoring SPO2 with pulse oximeter and demonstrate accurate use of the pulse oximeter.;To learn and understand importance of maintaining oxygen saturations>88%;To learn and demonstrate proper pursed lip breathing techniques or other breathing techniques. ;To learn and demonstrate proper use of respiratory medications    Long  Term Goals Exhibits compliance with exercise, home  and travel O2 prescription;Verbalizes importance of monitoring SPO2 with pulse oximeter and return demonstration;Maintenance of O2 saturations>88%;Exhibits proper breathing techniques, such as pursed lip breathing or other method taught during program session;Compliance with respiratory medication;Demonstrates proper use of MDI's             Oxygen Re-Evaluation:   Oxygen Discharge (Final Oxygen Re-Evaluation):   Initial Exercise Prescription:  Initial Exercise Prescription - 07/27/22 1100       Date of Initial Exercise RX and Referring Provider   Date 07/27/22    Referring Provider [name]    Expected Discharge Date 10/04/22      Recumbant Bike   Level 1    Minutes 15    METs 2      NuStep   Level 1    SPM 60    Minutes 15    METs 1.8      Prescription Details   Frequency (times per week) 2    Duration Progress to 30 minutes of continuous aerobic without signs/symptoms of physical distress      Intensity   THRR 40-80% of Max Heartrate 58-117    Ratings of Perceived Exertion 11-13    Perceived Dyspnea 0-4      Progression   Progression Continue progressive overload as per policy without signs/symptoms or physical distress.      Resistance Training   Training Prescription Yes    Weight red bands    Reps 10-15             Perform Capillary Blood Glucose checks as needed.  Exercise Prescription Changes:   Exercise Comments:   Exercise Goals and Review:   Exercise Goals     Row Name 07/27/22 0926             Exercise Goals   Increase Physical  Activity Yes       Intervention Provide advice, education, support and counseling about physical activity/exercise needs.;Develop an individualized exercise prescription for aerobic and resistive training based on initial evaluation findings, risk stratification, comorbidities and participant's personal goals.       Expected Outcomes Short Term: Attend rehab on a regular basis to increase amount of physical activity.;Long Term: Add in home exercise to make exercise part of routine and to increase amount of physical activity.;Long Term: Exercising regularly at least 3-5 days a week.       Increase Strength and Stamina Yes       Intervention Provide advice, education, support and counseling about physical activity/exercise needs.;Develop an individualized exercise prescription for aerobic and resistive training based on initial evaluation findings, risk stratification, comorbidities and participant's personal goals.       Expected Outcomes Short Term: Increase workloads from initial exercise prescription for resistance, speed,  and METs.;Short Term: Perform resistance training exercises routinely during rehab and add in resistance training at home;Long Term: Improve cardiorespiratory fitness, muscular endurance and strength as measured by increased METs and functional capacity (6MWT)       Able to understand and use rate of perceived exertion (RPE) scale Yes       Intervention Provide education and explanation on how to use RPE scale       Expected Outcomes Short Term: Able to use RPE daily in rehab to express subjective intensity level;Long Term:  Able to use RPE to guide intensity level when exercising independently       Able to understand and use Dyspnea scale Yes       Intervention Provide education and explanation on how to use Dyspnea scale       Expected Outcomes Short Term: Able to use Dyspnea scale daily in rehab to express subjective sense of shortness of breath during exertion;Long Term: Able to  use Dyspnea scale to guide intensity level when exercising independently       Knowledge and understanding of Target Heart Rate Range (THRR) Yes       Intervention Provide education and explanation of THRR including how the numbers were predicted and where they are located for reference       Expected Outcomes Short Term: Able to state/look up THRR;Long Term: Able to use THRR to govern intensity when exercising independently;Short Term: Able to use daily as guideline for intensity in rehab       Understanding of Exercise Prescription Yes       Intervention Provide education, explanation, and written materials on patient's individual exercise prescription       Expected Outcomes Short Term: Able to explain program exercise prescription;Long Term: Able to explain home exercise prescription to exercise independently                Exercise Goals Re-Evaluation :   Discharge Exercise Prescription (Final Exercise Prescription Changes):   Nutrition:  Target Goals: Understanding of nutrition guidelines, daily intake of sodium '1500mg'$ , cholesterol '200mg'$ , calories 30% from fat and 7% or less from saturated fats, daily to have 5 or more servings of fruits and vegetables.  Biometrics:  Pre Biometrics - 07/27/22 0910       Pre Biometrics   Grip Strength 19 kg              Nutrition Therapy Plan and Nutrition Goals:   Nutrition Assessments:  MEDIFICTS Score Key: ?70 Need to make dietary changes  40-70 Heart Healthy Diet ? 40 Therapeutic Level Cholesterol Diet   Picture Your Plate Scores: <50 Unhealthy dietary pattern with much room for improvement. 41-50 Dietary pattern unlikely to meet recommendations for good health and room for improvement. 51-60 More healthful dietary pattern, with some room for improvement.  >60 Healthy dietary pattern, although there may be some specific behaviors that could be improved.    Nutrition Goals Re-Evaluation:   Nutrition Goals Discharge  (Final Nutrition Goals Re-Evaluation):   Psychosocial: Target Goals: Acknowledge presence or absence of significant depression and/or stress, maximize coping skills, provide positive support system. Participant is able to verbalize types and ability to use techniques and skills needed for reducing stress and depression.  Initial Review & Psychosocial Screening:  Initial Psych Review & Screening - 07/27/22 0920       Initial Review   Current issues with None Identified    Comments Lives w/ son. Does not report psychosocial barriers to exercise.  Family Dynamics   Good Support System? Yes    Comments son      Barriers   Psychosocial barriers to participate in program There are no identifiable barriers or psychosocial needs.      Screening Interventions   Interventions Encouraged to exercise             Quality of Life Scores:  Scores of 19 and below usually indicate a poorer quality of life in these areas.  A difference of  2-3 points is a clinically meaningful difference.  A difference of 2-3 points in the total score of the Quality of Life Index has been associated with significant improvement in overall quality of life, self-image, physical symptoms, and general health in studies assessing change in quality of life.  PHQ-9: Review Flowsheet       07/27/2022  Depression screen PHQ 2/9  Decreased Interest 0  Down, Depressed, Hopeless 0  PHQ - 2 Score 0  Altered sleeping 0  Tired, decreased energy 0  Change in appetite 0  Feeling bad or failure about yourself  0  Trouble concentrating 0  Moving slowly or fidgety/restless 0  Suicidal thoughts 0  PHQ-9 Score 0  Difficult doing work/chores Not difficult at all   Interpretation of Total Score  Total Score Depression Severity:  1-4 = Minimal depression, 5-9 = Mild depression, 10-14 = Moderate depression, 15-19 = Moderately severe depression, 20-27 = Severe depression   Psychosocial Evaluation and Intervention:   Psychosocial Evaluation - 07/27/22 0923       Psychosocial Evaluation & Interventions   Interventions Encouraged to exercise with the program and follow exercise prescription    Comments Kahlee does not report any psychosocial concerns.    Expected Outcomes For Alanie to participate in Pulmonary Rehab free of psychosocial concerns.    Continue Psychosocial Services  No Follow up required             Psychosocial Re-Evaluation:   Psychosocial Discharge (Final Psychosocial Re-Evaluation):   Education: Education Goals: Education classes will be provided on a weekly basis, covering required topics. Participant will state understanding/return demonstration of topics presented.  Learning Barriers/Preferences:  Learning Barriers/Preferences - 07/27/22 0924       Learning Barriers/Preferences   Learning Barriers Sight   Bilat eye surgery   Learning Preferences None             Education Topics: Introduction to Pulmonary Rehab Group instruction provided by PowerPoint, verbal discussion, and written material to support subject matter. Instructor reviews what Pulmonary Rehab is, the purpose of the program, and how patients are referred.     Know Your Numbers Group instruction that is supported by a PowerPoint presentation. Instructor discusses importance of knowing and understanding resting, exercise, and post-exercise oxygen saturation, heart rate, and blood pressure. Oxygen saturation, heart rate, blood pressure, rating of perceived exertion, and dyspnea are reviewed along with a normal range for these values.    Exercise for the Pulmonary Patient Group instruction that is supported by a PowerPoint presentation. Instructor discusses benefits of exercise, core components of exercise, frequency, duration, and intensity of an exercise routine, importance of utilizing pulse oximetry during exercise, safety while exercising, and options of places to exercise outside of rehab.        MET Level  Group instruction provided by PowerPoint, verbal discussion, and written material to support subject matter. Instructor reviews what METs are and how to increase METs.    Pulmonary Medications Verbally interactive group education provided by  instructor with focus on inhaled medications and proper administration.   Anatomy and Physiology of the Respiratory System Group instruction provided by PowerPoint, verbal discussion, and written material to support subject matter. Instructor reviews respiratory cycle and anatomical components of the respiratory system and their functions. Instructor also reviews differences in obstructive and restrictive respiratory diseases with examples of each.    Oxygen Safety Group instruction provided by PowerPoint, verbal discussion, and written material to support subject matter. There is an overview of "What is Oxygen" and "Why do we need it".  Instructor also reviews how to create a safe environment for oxygen use, the importance of using oxygen as prescribed, and the risks of noncompliance. There is a brief discussion on traveling with oxygen and resources the patient may utilize.   Oxygen Use Group instruction provided by PowerPoint, verbal discussion, and written material to discuss how supplemental oxygen is prescribed and different types of oxygen supply systems. Resources for more information are provided.    Breathing Techniques Group instruction that is supported by demonstration and informational handouts. Instructor discusses the benefits of pursed lip and diaphragmatic breathing and detailed demonstration on how to perform both.     Risk Factor Reduction Group instruction that is supported by a PowerPoint presentation. Instructor discusses the definition of a risk factor, different risk factors for pulmonary disease, and how the heart and lungs work together.   MD Day A group question and answer session with a medical  doctor that allows participants to ask questions that relate to their pulmonary disease state.   Nutrition for the Pulmonary Patient Group instruction provided by PowerPoint slides, verbal discussion, and written materials to support subject matter. The instructor gives an explanation and review of healthy diet recommendations, which includes a discussion on weight management, recommendations for fruit and vegetable consumption, as well as protein, fluid, caffeine, fiber, sodium, sugar, and alcohol. Tips for eating when patients are short of breath are discussed.    Other Education Group or individual verbal, written, or video instructions that support the educational goals of the pulmonary rehab program.    Knowledge Questionnaire Score:  Knowledge Questionnaire Score - 07/27/22 1110       Knowledge Questionnaire Score   Pre Score 18/18             Core Components/Risk Factors/Patient Goals at Admission:  Personal Goals and Risk Factors at Admission - 07/27/22 0925       Core Components/Risk Factors/Patient Goals on Admission   Improve shortness of breath with ADL's Yes    Intervention Provide education, individualized exercise plan and daily activity instruction to help decrease symptoms of SOB with activities of daily living.    Expected Outcomes Short Term: Improve cardiorespiratory fitness to achieve a reduction of symptoms when performing ADLs;Long Term: Be able to perform more ADLs without symptoms or delay the onset of symptoms    Increase knowledge of respiratory medications and ability to use respiratory devices properly  Yes    Intervention Provide education and demonstration as needed of appropriate use of medications, inhalers, and oxygen therapy.    Expected Outcomes Short Term: Achieves understanding of medications use. Understands that oxygen is a medication prescribed by physician. Demonstrates appropriate use of inhaler and oxygen therapy.;Long Term: Maintain  appropriate use of medications, inhalers, and oxygen therapy.             Core Components/Risk Factors/Patient Goals Review:    Core Components/Risk Factors/Patient Goals at Discharge (Final Review):    ITP  Comments: Dr. Rodman Pickle is Medical Director for Pulmonary Rehab at [facility].

## 2022-07-27 NOTE — Progress Notes (Signed)
Susan Bray 76 y.o. female Pulmonary Rehab Orientation Note This patient who was referred to Pulmonary Rehab by Dr. Wonda Amis with the diagnosis of ILD arrived today in Cardiac and Pulmonary Rehab. She  arrived ambulatory with normal gait. She  does not carry portable oxygen. Per patient, Susan Bray uses oxygen never. Color good, skin warm and dry. Patient is oriented to time and place. Patient's medical history, psychosocial health, and medications reviewed. Psychosocial assessment reveals patient lives with family. Susan Bray is currently retired. Patient hobbies include gardening and spending time with family. Patient reports her stress level is low. Areas of stress/anxiety include N/A. Patient does not exhibit signs of depression. PHQ2/9 score 0/0. Susan Bray shows good  coping skills with positive outlook on life. Offered emotional support and reassurance. Will continue to monitor. Physical assessment performed by Maurice Small RN. Please see their orientation physical assessment note. Susan Bray reports she does take medications as prescribed. Patient states she follows a regular  diet. The patient reports no specific efforts to gain or lose weight.. Patient's weight will be monitored closely. Demonstration and practice of PLB using pulse oximeter. Susan Bray able to return demonstration satisfactorily. Safety and hand hygiene in the exercise area reviewed with patient. Susan Bray voices understanding of the information reviewed. Department expectations discussed with patient and achievable goals were set. The patient shows enthusiasm about attending the program and we look forward to working with Susan Bray. Susan Bray completed a 6 min walk test today and is scheduled to begin exercise on 08/02/22 at 10:15 am.   [id number] Susan Plumber, MS, ACSM-CEP

## 2022-08-02 ENCOUNTER — Encounter (HOSPITAL_COMMUNITY)
Admission: RE | Admit: 2022-08-02 | Discharge: 2022-08-02 | Disposition: A | Discharge status: Home / Self Care | Payer: PPO | Source: Ambulatory Visit | Attending: Pulmonary Disease | Admitting: Pulmonary Disease

## 2022-08-02 DIAGNOSIS — J849 Interstitial pulmonary disease, unspecified: Secondary | ICD-10-CM | POA: Diagnosis not present

## 2022-08-02 NOTE — Progress Notes (Signed)
Daily Session Note  Patient Details  Name: Susan Bray MRN: [id number] Date of Birth: September 03, 1946 Referring Provider:   [name] Pulmonary Rehab Walk Test from 07/27/2022 in [place]  Referring Provider [name]       Encounter Date: 08/02/2022  Check In:  Session Check In - 08/02/22 1120       Check-In   Supervising physician immediately available to respond to emergencies [facility] - Physician supervision    Physician(s) Dr, Erskine Emery    Location [facility]    Staff Present Elmon Else, MS, ACSM-CEP, Exercise Physiologist;Lisa Ysidro Evert, RN;Randi Olen Cordial BS, ACSM-CEP, Exercise Physiologist    Virtual Visit No    Medication changes reported     No    Fall or balance concerns reported    No    Tobacco Cessation No Change    Warm-up and Cool-down Performed as group-led instruction    Resistance Training Performed Yes    VAD Patient? No    PAD/SET Patient? No      Pain Assessment   Currently in Pain? No/denies    Multiple Pain Sites No             Capillary Blood Glucose: No results found for this or any previous visit (from the past 24 hour(s)).    Social History   Tobacco Use  Smoking Status Never  Smokeless Tobacco Never    Goals Met:  Proper associated with RPD/PD & O2 Sat Exercise tolerated well No report of concerns or symptoms today Strength training completed today  Goals Unmet:  Not Applicable  Comments: Service time is from 1012 to 1140.    Dr. Rodman Pickle is Medical Director for Pulmonary Rehab at [facility].

## 2022-08-07 ENCOUNTER — Encounter (HOSPITAL_COMMUNITY)
Admission: RE | Admit: 2022-08-07 | Discharge: 2022-08-07 | Disposition: A | Discharge status: Home / Self Care | Payer: PPO | Source: Ambulatory Visit | Attending: Pulmonary Disease | Admitting: Pulmonary Disease

## 2022-08-07 VITALS — Wt 122.8 lb

## 2022-08-07 DIAGNOSIS — J849 Interstitial pulmonary disease, unspecified: Secondary | ICD-10-CM

## 2022-08-07 NOTE — Progress Notes (Signed)
Daily Session Note  Patient Details  Name: Susan Bray MRN: [id number] Date of Birth: 04-27-1946 Referring Provider:   [name] Pulmonary Rehab Walk Test from 07/27/2022 in [place]  Referring Provider [name]       Encounter Date: 08/07/2022  Check In:  Session Check In - 08/07/22 1035       Check-In   Supervising physician immediately available to respond to emergencies [facility] - Physician supervision    Physician(s) Chand    Location [facility]    Staff Present Elmon Else, MS, ACSM-CEP, Exercise Physiologist; Ysidro Evert, RN;Randi Olen Cordial BS, ACSM-CEP, Exercise Physiologist;Samantha [place], RD, LDN    Virtual Visit No    Medication changes reported     No    Fall or balance concerns reported    No    Tobacco Cessation No Change    Warm-up and Cool-down Performed as group-led instruction    Resistance Training Performed Yes    VAD Patient? No    PAD/SET Patient? No      Pain Assessment   Currently in Pain? No/denies             Capillary Blood Glucose: No results found for this or any previous visit (from the past 24 hour(s)).   Exercise Prescription Changes - 08/07/22 1200       Response to Exercise   Blood Pressure (Admit) 118/62    Blood Pressure (Exercise) 130/60    Blood Pressure (Exit) 106/60    Heart Rate (Admit) 60 bpm    Heart Rate (Exercise) 75 bpm    Heart Rate (Exit) 59 bpm    Oxygen Saturation (Admit) 99 %    Oxygen Saturation (Exercise) 97 %    Oxygen Saturation (Exit) 98 %    Rating of Perceived Exertion (Exercise) 13    Perceived Dyspnea (Exercise) 1    Duration Progress to 30 minutes of  aerobic without signs/symptoms of physical distress    Intensity Other (comment)   40-80% of HRR     Progression   Progression Continue to progress workloads to maintain intensity without signs/symptoms of physical distress.      Resistance Training   Training Prescription Yes    Weight Red bands     Reps 10-15    Time 10 Minutes      Recumbant Bike   Level 1    Minutes 15    METs 1.8      NuStep   Level 2    SPM 70    Minutes 15    METs 2             Social History   Tobacco Use  Smoking Status Never  Smokeless Tobacco Never    Goals Met:  Proper associated with RPD/PD & O2 Sat Exercise tolerated well No report of concerns or symptoms today Strength training completed today  Goals Unmet:  Not Applicable  Comments: Service time is from 1012 to [place]    Dr. Rodman Pickle is Medical Director for Pulmonary Rehab at [facility].

## 2022-08-09 ENCOUNTER — Encounter (HOSPITAL_COMMUNITY)
Admission: RE | Admit: 2022-08-09 | Discharge: 2022-08-09 | Disposition: A | Discharge status: Home / Self Care | Payer: PPO | Source: Ambulatory Visit | Attending: Pulmonary Disease | Admitting: Pulmonary Disease

## 2022-08-09 DIAGNOSIS — J849 Interstitial pulmonary disease, unspecified: Secondary | ICD-10-CM

## 2022-08-09 NOTE — Progress Notes (Signed)
Daily Session Note  Patient Details  Name: Susan Bray MRN: [id number] Date of Birth: [birth year] Referring Provider:   [name] Pulmonary Rehab Walk Test from 07/27/2022 in [place]  Referring Provider [name]       Encounter Date: 08/09/2022  Check In:  Session Check In - 08/09/22 1128       Check-In   Supervising physician immediately available to respond to emergencies [facility] - Physician supervision    Physician(s) Chand    [facility]    Staff Present Samantha [place], RD, Philip Aspen, MS, ACSM-CEP, Exercise Physiologist;Lisa Ysidro Evert, RN;Randi Olen Cordial BS, ACSM-CEP, Exercise Physiologist    Virtual Visit No    Medication changes reported     No    Fall or balance concerns reported    No    Tobacco Cessation No Change    Warm-up and Cool-down Performed as group-led instruction    Resistance Training Performed Yes    VAD Patient? No    PAD/SET Patient? No      Pain Assessment   Currently in Pain? No/denies             Capillary Blood Glucose: No results found for this or any previous visit (from the past 24 hour(s)).    Social History   Tobacco Use  Smoking Status Never  Smokeless Tobacco Never    Goals Met:  Proper associated with RPD/PD & O2 Sat Exercise tolerated well No report of concerns or symptoms today Strength training completed today  Goals Unmet:  Not Applicable  Comments: Service time is from 1012 to 1143.    Dr. Rodman Pickle is Medical Director for Pulmonary Rehab at [facility].

## 2022-08-13 ENCOUNTER — Encounter: Payer: Self-pay | Admitting: Pulmonary Disease

## 2022-08-13 ENCOUNTER — Ambulatory Visit: Payer: PPO | Admitting: Pulmonary Disease

## 2022-08-13 ENCOUNTER — Other Ambulatory Visit (HOSPITAL_COMMUNITY): Payer: Self-pay

## 2022-08-13 VITALS — BP 116/62 | HR 68 | Temp 97.9°F | Ht 63.0 in | Wt 122.8 lb

## 2022-08-13 DIAGNOSIS — Z5181 Encounter for therapeutic drug level monitoring: Secondary | ICD-10-CM

## 2022-08-13 DIAGNOSIS — J849 Interstitial pulmonary disease, unspecified: Secondary | ICD-10-CM | POA: Diagnosis not present

## 2022-08-13 LAB — CBC WITH DIFFERENTIAL/PLATELET
Basophils Absolute: 0 10*3/uL (ref 0.0–0.1)
Basophils Relative: 0.8 % (ref 0.0–3.0)
Eosinophils Absolute: 0.2 10*3/uL (ref 0.0–0.7)
Eosinophils Relative: 3.7 % (ref 0.0–5.0)
HCT: 41.9 % (ref 36.0–46.0)
Hemoglobin: 13.6 g/dL (ref 12.0–15.0)
Lymphocytes Relative: 30.5 % (ref 12.0–46.0)
Lymphs Abs: 1.6 10*3/uL (ref 0.7–4.0)
MCHC: 32.5 g/dL (ref 30.0–36.0)
MCV: 90.7 fl (ref 78.0–100.0)
Monocytes Absolute: 0.5 10*3/uL (ref 0.1–1.0)
Monocytes Relative: 10.1 % (ref 3.0–12.0)
Neutro Abs: 2.8 10*3/uL (ref 1.4–7.7)
Neutrophils Relative %: 54.9 % (ref 43.0–77.0)
Platelets: 283 10*3/uL (ref 150.0–400.0)
RBC: 4.62 Mil/uL (ref 3.87–5.11)
RDW: 15 % (ref 11.5–15.5)
WBC: 5.2 10*3/uL (ref 4.0–10.5)

## 2022-08-13 LAB — COMPREHENSIVE METABOLIC PANEL
ALT: 12 U/L (ref 0–35)
AST: 20 U/L (ref 0–37)
Albumin: 4 g/dL (ref 3.5–5.2)
Alkaline Phosphatase: 44 U/L (ref 39–117)
BUN: 13 mg/dL (ref 6–23)
CO2: 33 mEq/L — ABNORMAL HIGH (ref 19–32)
Calcium: 10 mg/dL (ref 8.4–10.5)
Chloride: 101 mEq/L (ref 96–112)
Creatinine, Ser: 1.03 mg/dL (ref 0.40–1.20)
GFR: 53.5 mL/min — ABNORMAL LOW (ref >60.00)
Glucose, Bld: 85 mg/dL (ref 70–99)
Potassium: 3.5 mEq/L (ref 3.5–5.1)
Sodium: 139 mEq/L (ref 135–145)
Total Bilirubin: 0.6 mg/dL (ref 0.2–1.2)
Total Protein: 7.2 g/dL (ref 6.0–8.3)

## 2022-08-13 MED ORDER — AZATHIOPRINE 50 MG PO TABS: 150.0000 mg | ORAL_TABLET | Freq: Every day | ORAL | 5 refills | Status: DC

## 2022-08-13 NOTE — Progress Notes (Addendum)
Susan Bray    [id number]    1946/03/23  Primary Care Physician:Hammer, Geryl Rankins, MD  Referring Physician: Collene Leyden, MD [address] [place],  [place] [address]  Chief complaint: Follow-up for interstitial lung disease  HPI: 76 y.o. who  has a past medical history of CAD (coronary artery disease), Cataract, Hyperlipidemia, IBS (irritable bowel syndrome), and VIN III (vulvar intraepithelial neoplasia III).   She has Sjogren's syndrome and dermatomyositis with initial complaints of skin thickening, joint stiffness, dyspnea and cough.  Diagnosis made in June 2023 by Dr. Kathlene November.  Initially tried on prednisone and methotrexate.  Methotrexate was stopped for unclear reason.  Continues on prednisone at 10 mg.  She was started on azathioprine 50 mg/day in July 2023 and this was increased to 100 mg/day in August 2023.  Her symptoms have improved with the treatment  Imaging shows interstitial lung disease and she has been referred here for further evaluation  Pets: No pets Occupation: Retired Optometrist Exposures: No mold, hot tub, Jacuzzi.  No feather pillows or comforters Smoking history: Never smoker Travel history: Significant travel history Relevant family history: No family history of lung disease  Interim history: Currently on azathioprine 100 mg a day.  Prednisone has been reduced to 5 arms by Dr. Kathlene November Has started pulmonary rehab.  Complains of mild fatigue and dizziness Dyspnea on exertion is stable.   Outpatient Encounter Medications as of 08/13/2022  Medication Sig   azaTHIOprine (IMURAN) 50 MG tablet Take '100mg'$  daily (Patient taking differently: Take 100 mg by mouth daily. Take '100mg'$  daily)   predniSONE (DELTASONE) 10 MG tablet Take 5 mg by mouth daily.   SYNTHROID 50 MCG tablet Take by mouth.   [DISCONTINUED] thyroid (ARMOUR THYROID) 60 MG tablet 1 tablet on an empty stomach, Alternating '50mg'$  Synthroid   No facility-administered encounter medications on  file as of 08/13/2022.    Physical Exam: Blood pressure 116/62, pulse 68, temperature 97.9 F (36.6 C), temperature source Oral, height '5\' 3"'$  (1.6 m), weight 122 lb 12.8 oz (55.7 kg), SpO2 97 %. Gen:      No acute distress HEENT:  EOMI, sclera anicteric Neck:     No masses; no thyromegaly Lungs:    Clear to auscultation bilaterally; normal respiratory effort CV:         Regular rate and rhythm; no murmurs Abd:      + bowel sounds; soft, non-tender; no palpable masses, no distension Ext:    No edema; adequate peripheral perfusion Skin:      Warm and dry; no rash Neuro: alert and oriented x 3 Psych: normal mood and affect   Data Reviewed: Imaging: Cardiac CT 10/19/2015-visualized lung shows fibrotic changes with groundglass and traction bronchiectasis.  High-resolution CT 05/31/2022-basilar predominant fibrotic disease with groundglass opacities, traction bronchiectasis.  Alternate diagnosis suggestive of fibrotic NSIP I have reviewed the images personally  PFTs: 06/10/2022 FVC 2.17 [79%], FEV1 1.94 [94%], F/F 89, TLC 3.04 [61%], DLCO 16.88 [91%] Moderate restriction  Labs: ILD panel 05/22/2022- ANA 1: 320, positive SSA, NX P2 antibody C-ANCA 05/22/2022- positive  Assessment:  Interstitial lung disease Likely related to her diagnosis of Sjogren's and dermatomyositis.  The pattern appears to be alternate suggestive of fibrotic NSIP.  This was noted as early as 2016 on a cardiac CT and appears largely unchanged.  PFTs show moderate restriction  Currently on azathioprine at 100 mg.  I will increase dose to 150 a day She is  getting slow prednisone taper.  Currently at 5 mg/day.  Prednisone taper is being managed by Dr. Kathlene November, rheumatology  Health maintenance I have advised her to get flu, COVID and RSV vaccination but she has declined these vaccinations.  Plan/Recommendations: Increase azathioprine to 150 mg a day Check labs for monitoring Follow-up in 1 to 2 months.  Marshell Garfinkel MD [facility] 08/13/2022, 9:28 AM  CC: Collene Leyden, MD

## 2022-08-13 NOTE — Patient Instructions (Signed)
Glad you are stable with your breathing We will check some labs today including comprehensive metabolic panel, CBC with differential Increase azathioprine to 3 tablets daily Continue pulmonary rehab Follow-up in 1 to 2 months.

## 2022-08-13 NOTE — Addendum Note (Signed)
Addended by: Elton Sin on: 08/13/2022 11:52 AM   Modules accepted: Orders

## 2022-08-14 ENCOUNTER — Encounter (HOSPITAL_COMMUNITY)
Admission: RE | Admit: 2022-08-14 | Discharge: 2022-08-14 | Disposition: A | Discharge status: Home / Self Care | Payer: PPO | Source: Ambulatory Visit | Attending: Pulmonary Disease | Admitting: Pulmonary Disease

## 2022-08-14 DIAGNOSIS — J849 Interstitial pulmonary disease, unspecified: Secondary | ICD-10-CM

## 2022-08-14 NOTE — Progress Notes (Signed)
Daily Session Note  Patient Details  Name: Susan Bray MRN: [id number] Date of Birth: 12/08/1945 Referring Provider:   [name] Pulmonary Rehab Walk Test from 07/27/2022 in [place]  Referring Provider [name]       Encounter Date: 08/14/2022  Check In:  Session Check In - 08/14/22 1126       Check-In   Supervising physician immediately available to respond to emergencies [facility] - Physician supervision    Physician(s) Ander Slade    Location [facility]    Staff Present Elmon Else, MS, ACSM-CEP, Exercise Physiologist;Samantha [place], RD, LDN; Ysidro Evert, RN;Randi [employer], ACSM-CEP, Exercise Physiologist;David Tampico, MS, ACSM-CEP, CCRP, Exercise Physiologist    Virtual Visit No    Medication changes reported     No    Fall or balance concerns reported    No    Tobacco Cessation No Change    Warm-up and Cool-down Performed as group-led instruction    Resistance Training Performed Yes    VAD Patient? No    PAD/SET Patient? No      Pain Assessment   Currently in Pain? No/denies             Capillary Blood Glucose: No results found for this or any previous visit (from the past 24 hour(s)).    Social History   Tobacco Use  Smoking Status Never  Smokeless Tobacco Never    Goals Met:  Proper associated with RPD/PD & O2 Sat Exercise tolerated well No report of concerns or symptoms today Strength training completed today  Goals Unmet:  Not Applicable  Comments: Service time is from [place] to [place]    Dr. Rodman Pickle is Medical Director for Pulmonary Rehab at [facility].

## 2022-08-15 DIAGNOSIS — L299 Pruritus, unspecified: Secondary | ICD-10-CM | POA: Diagnosis not present

## 2022-08-15 DIAGNOSIS — M3390 Dermatopolymyositis, unspecified, organ involvement unspecified: Secondary | ICD-10-CM | POA: Diagnosis not present

## 2022-08-15 DIAGNOSIS — E559 Vitamin D deficiency, unspecified: Secondary | ICD-10-CM | POA: Diagnosis not present

## 2022-08-15 DIAGNOSIS — Z8249 Family history of ischemic heart disease and other diseases of the circulatory system: Secondary | ICD-10-CM | POA: Diagnosis not present

## 2022-08-15 DIAGNOSIS — E063 Autoimmune thyroiditis: Secondary | ICD-10-CM | POA: Diagnosis not present

## 2022-08-15 DIAGNOSIS — M3500 Sicca syndrome, unspecified: Secondary | ICD-10-CM | POA: Diagnosis not present

## 2022-08-15 DIAGNOSIS — G2581 Restless legs syndrome: Secondary | ICD-10-CM | POA: Diagnosis not present

## 2022-08-15 DIAGNOSIS — G5620 Lesion of ulnar nerve, unspecified upper limb: Secondary | ICD-10-CM | POA: Diagnosis not present

## 2022-08-15 DIAGNOSIS — R42 Dizziness and giddiness: Secondary | ICD-10-CM | POA: Diagnosis not present

## 2022-08-15 DIAGNOSIS — I700 Atherosclerosis of aorta: Secondary | ICD-10-CM | POA: Diagnosis not present

## 2022-08-15 DIAGNOSIS — E7800 Pure hypercholesterolemia, unspecified: Secondary | ICD-10-CM | POA: Diagnosis not present

## 2022-08-15 DIAGNOSIS — J849 Interstitial pulmonary disease, unspecified: Secondary | ICD-10-CM | POA: Diagnosis not present

## 2022-08-15 NOTE — Progress Notes (Signed)
Pulmonary Individual Treatment Plan  Patient Details  Name: Susan Bray MRN: [id number] Date of Birth: 1946/08/11 Referring Provider:   [name] Pulmonary Rehab Walk Test from 07/27/2022 in [place]  Referring Provider [name]       Initial Encounter Date:  Flowsheet Row Pulmonary Rehab Walk Test from 07/27/2022 in [place]  Date 07/27/22       Visit Diagnosis: ILD (interstitial lung disease) ([facility])  Patient's Home Medications on Admission:   Current Outpatient Medications:    azaTHIOprine (IMURAN) 50 MG tablet, Take 3 tablets (150 mg total) by mouth daily., Disp: 90 tablet, Rfl: 5   predniSONE (DELTASONE) 10 MG tablet, Take 5 mg by mouth daily., Disp: , Rfl:    SYNTHROID 50 MCG tablet, Take by mouth., Disp: , Rfl:   Past Medical History: Past Medical History:  Diagnosis Date   CAD (coronary artery disease)    Cataract    Hyperlipidemia    IBS (irritable bowel syndrome)    VIN III (vulvar intraepithelial neoplasia III)     Tobacco Use: Social History   Tobacco Use  Smoking Status Never  Smokeless Tobacco Never    Labs: Review Flowsheet       Latest Ref Rng & Units 09/08/2014 11/15/2015  Labs for ITP Cardiac and Pulmonary Rehab  Cholestrol 125 - 200 mg/dL 180  149   LDL (calc) <130 mg/dL 113  79   HDL-C >=46 mg/dL 46.20  47   Trlycerides <150 mg/dL 106.0  117     Capillary Blood Glucose: No results found for: "GLUCAP"   Pulmonary Assessment Scores:  Pulmonary Assessment Scores     Row Name 07/27/22 1102         ADL UCSD   ADL Phase Entry     SOB Score total 53       CAT Score   CAT Score 14       mMRC Score   mMRC Score 3             UCSD: Self-administered rating of dyspnea associated with activities of daily living (ADLs) 6-point scale (0 = "not at all" to 5 = "maximal or unable to do because of breathlessness")  Scoring Scores range from 0 to 120.  Minimally  important difference is 5 units  CAT: CAT can identify the health impairment of COPD patients and is better correlated with disease progression.  CAT has a scoring range of zero to 40. The CAT score is classified into four groups of low (less than 10), medium (10 - 20), high (21-30) and very high (31-40) based on the impact level of disease on health status. A CAT score over 10 suggests significant symptoms.  A worsening CAT score could be explained by an exacerbation, poor medication adherence, poor inhaler technique, or progression of COPD or comorbid conditions.  CAT MCID is 2 points  mMRC: mMRC (Modified Medical Research Council) Dyspnea Scale is used to assess the degree of baseline functional disability in patients of respiratory disease due to dyspnea. No minimal important difference is established. A decrease in score of 1 point or greater is considered a positive change.   Pulmonary Function Assessment:  Pulmonary Function Assessment - 07/27/22 1102       Breath   Bilateral Breath Sounds Clear    Shortness of Breath Yes;Fear of Shortness of Breath;Limiting activity             Exercise Target Goals:  Exercise Program Goal: Individual exercise prescription set using results from initial 6 min walk test and THRR while considering  patient's activity barriers and safety.   Exercise Prescription Goal: Initial exercise prescription builds to 30-45 minutes a day of aerobic activity, 2-3 days per week.  Home exercise guidelines will be given to patient during program as part of exercise prescription that the participant will acknowledge.  Activity Barriers & Risk Stratification:  Activity Barriers & Cardiac Risk Stratification - 07/27/22 0925       Activity Barriers & Cardiac Risk Stratification   Activity Barriers Deconditioning;Muscular Weakness;Shortness of Breath;History of Falls    Cardiac Risk Stratification Moderate             6 Minute Walk:  6 Minute Walk      Row Name 07/27/22 1105         6 Minute Walk   Phase Initial     Distance 1005 feet     Walk Time 6 minutes     # of Rest Breaks 0     MPH 3.07     METS 3.35     RPE 13     Perceived Dyspnea  3     VO2 Peak 11.71     Symptoms No     Resting HR 69 bpm     Resting BP 120/60     Resting Oxygen Saturation  97 %     Exercise Oxygen Saturation  during 6 min walk 93 %     Max Ex. HR 80 bpm     Max Ex. BP 138/60     2 Minute Post BP 124/60       Interval HR   1 Minute HR 77     2 Minute HR 80     3 Minute HR 79     4 Minute HR 76     5 Minute HR 80     6 Minute HR 78     2 Minute Post HR 64     Interval Heart Rate? Yes       Interval Oxygen   Interval Oxygen? Yes     Baseline Oxygen Saturation % 97 %     1 Minute Oxygen Saturation % 97 %     1 Minute Liters of Oxygen 0 L     2 Minute Oxygen Saturation % 99 %     2 Minute Liters of Oxygen 0 L     3 Minute Oxygen Saturation % 93 %     3 Minute Liters of Oxygen 0 L     4 Minute Oxygen Saturation % 98 %     4 Minute Liters of Oxygen 0 L     5 Minute Oxygen Saturation % 96 %     5 Minute Liters of Oxygen 0 L     6 Minute Oxygen Saturation % 97 %     6 Minute Liters of Oxygen 0 L     2 Minute Post Oxygen Saturation % 100 %     2 Minute Post Liters of Oxygen 64 L              Oxygen Initial Assessment:  Oxygen Initial Assessment - 07/27/22 0921       Home Oxygen   Home Oxygen Device None    Sleep Oxygen Prescription None    Home Exercise Oxygen Prescription None    Home Resting Oxygen Prescription None      Initial 6  min Walk   Oxygen Used None      Program Oxygen Prescription   Program Oxygen Prescription None      Intervention   Short Term Goals To learn and exhibit compliance with exercise, home and travel O2 prescription;To learn and understand importance of monitoring SPO2 with pulse oximeter and demonstrate accurate use of the pulse oximeter.;To learn and understand importance of maintaining  oxygen saturations>88%;To learn and demonstrate proper pursed lip breathing techniques or other breathing techniques. ;To learn and demonstrate proper use of respiratory medications    Long  Term Goals Exhibits compliance with exercise, home  and travel O2 prescription;Verbalizes importance of monitoring SPO2 with pulse oximeter and return demonstration;Maintenance of O2 saturations>88%;Exhibits proper breathing techniques, such as pursed lip breathing or other method taught during program session;Compliance with respiratory medication;Demonstrates proper use of MDI's             Oxygen Re-Evaluation:  Oxygen Re-Evaluation     Row Name 08/06/22 1201             Program Oxygen Prescription   Program Oxygen Prescription None         Home Oxygen   Home Oxygen Device None       Sleep Oxygen Prescription None       Home Exercise Oxygen Prescription None       Home Resting Oxygen Prescription None         Goals/Expected Outcomes   Short Term Goals To learn and exhibit compliance with exercise, home and travel O2 prescription;To learn and understand importance of monitoring SPO2 with pulse oximeter and demonstrate accurate use of the pulse oximeter.;To learn and understand importance of maintaining oxygen saturations>88%;To learn and demonstrate proper pursed lip breathing techniques or other breathing techniques. ;To learn and demonstrate proper use of respiratory medications       Long  Term Goals Exhibits compliance with exercise, home  and travel O2 prescription;Verbalizes importance of monitoring SPO2 with pulse oximeter and return demonstration;Maintenance of O2 saturations>88%;Exhibits proper breathing techniques, such as pursed lip breathing or other method taught during program session;Compliance with respiratory medication;Demonstrates proper use of MDI's       Goals/Expected Outcomes Compliance and understanding of oxygen saturation monitoring and breathing techniques to decrease  shortness of breath.                Oxygen Discharge (Final Oxygen Re-Evaluation):  Oxygen Re-Evaluation - 08/06/22 1201       Program Oxygen Prescription   Program Oxygen Prescription None      Home Oxygen   Home Oxygen Device None    Sleep Oxygen Prescription None    Home Exercise Oxygen Prescription None    Home Resting Oxygen Prescription None      Goals/Expected Outcomes   Short Term Goals To learn and exhibit compliance with exercise, home and travel O2 prescription;To learn and understand importance of monitoring SPO2 with pulse oximeter and demonstrate accurate use of the pulse oximeter.;To learn and understand importance of maintaining oxygen saturations>88%;To learn and demonstrate proper pursed lip breathing techniques or other breathing techniques. ;To learn and demonstrate proper use of respiratory medications    Long  Term Goals Exhibits compliance with exercise, home  and travel O2 prescription;Verbalizes importance of monitoring SPO2 with pulse oximeter and return demonstration;Maintenance of O2 saturations>88%;Exhibits proper breathing techniques, such as pursed lip breathing or other method taught during program session;Compliance with respiratory medication;Demonstrates proper use of MDI's    Goals/Expected Outcomes Compliance and understanding of  oxygen saturation monitoring and breathing techniques to decrease shortness of breath.             Initial Exercise Prescription:  Initial Exercise Prescription - 07/27/22 1100       Date of Initial Exercise RX and Referring Provider   Date 07/27/22    Referring Provider [name]    Expected Discharge Date 10/04/22      Recumbant Bike   Level 1    Minutes 15    METs 2      NuStep   Level 1    SPM 60    Minutes 15    METs 1.8      Prescription Details   Frequency (times per week) 2    Duration Progress to 30 minutes of continuous aerobic without signs/symptoms of physical distress      Intensity    THRR 40-80% of Max Heartrate 58-117    Ratings of Perceived Exertion 11-13    Perceived Dyspnea 0-4      Progression   Progression Continue progressive overload as per policy without signs/symptoms or physical distress.      Resistance Training   Training Prescription Yes    Weight red bands    Reps 10-15             Perform Capillary Blood Glucose checks as needed.  Exercise Prescription Changes:   Exercise Prescription Changes     Row Name 08/07/22 1200             Response to Exercise   Blood Pressure (Admit) 118/62       Blood Pressure (Exercise) 130/60       Blood Pressure (Exit) 106/60       Heart Rate (Admit) 60 bpm       Heart Rate (Exercise) 75 bpm       Heart Rate (Exit) 59 bpm       Oxygen Saturation (Admit) 99 %       Oxygen Saturation (Exercise) 97 %       Oxygen Saturation (Exit) 98 %       Rating of Perceived Exertion (Exercise) 13       Perceived Dyspnea (Exercise) 1       Duration Progress to 30 minutes of  aerobic without signs/symptoms of physical distress       Intensity Other (comment)  40-80% of HRR         Progression   Progression Continue to progress workloads to maintain intensity without signs/symptoms of physical distress.         Resistance Training   Training Prescription Yes       Weight Red bands       Reps 10-15       Time 10 Minutes         Recumbant Bike   Level 1       Minutes 15       METs 1.8         NuStep   Level 2       SPM 70       Minutes 15       METs 2                Exercise Comments:   Exercise Comments     Row Name 08/02/22 1217           Exercise Comments Pt completed her first day of exercise. She exercised on the recumbent bike and Nustep for 15 min.  Susan Bray averaged 1.9 METs at level 1 on the recumbent bike and 2.1 METs at level 1 on the Nustep. She performed the warmup and cooldown standing without limitations.                Exercise Goals and Review:   Exercise Goals      Row Name 07/27/22 0926 08/06/22 1158           Exercise Goals   Increase Physical Activity Yes Yes      Intervention Provide advice, education, support and counseling about physical activity/exercise needs.;Develop an individualized exercise prescription for aerobic and resistive training based on initial evaluation findings, risk stratification, comorbidities and participant's personal goals. Provide advice, education, support and counseling about physical activity/exercise needs.;Develop an individualized exercise prescription for aerobic and resistive training based on initial evaluation findings, risk stratification, comorbidities and participant's personal goals.      Expected Outcomes Short Term: Attend rehab on a regular basis to increase amount of physical activity.;Long Term: Add in home exercise to make exercise part of routine and to increase amount of physical activity.;Long Term: Exercising regularly at least 3-5 days a week. Short Term: Attend rehab on a regular basis to increase amount of physical activity.;Long Term: Add in home exercise to make exercise part of routine and to increase amount of physical activity.;Long Term: Exercising regularly at least 3-5 days a week.      Increase Strength and Stamina Yes Yes      Intervention Provide advice, education, support and counseling about physical activity/exercise needs.;Develop an individualized exercise prescription for aerobic and resistive training based on initial evaluation findings, risk stratification, comorbidities and participant's personal goals. Provide advice, education, support and counseling about physical activity/exercise needs.;Develop an individualized exercise prescription for aerobic and resistive training based on initial evaluation findings, risk stratification, comorbidities and participant's personal goals.      Expected Outcomes Short Term: Increase workloads from initial exercise prescription for resistance, speed,  and METs.;Short Term: Perform resistance training exercises routinely during rehab and add in resistance training at home;Long Term: Improve cardiorespiratory fitness, muscular endurance and strength as measured by increased METs and functional capacity (6MWT) Short Term: Increase workloads from initial exercise prescription for resistance, speed, and METs.;Short Term: Perform resistance training exercises routinely during rehab and add in resistance training at home;Long Term: Improve cardiorespiratory fitness, muscular endurance and strength as measured by increased METs and functional capacity (6MWT)      Able to understand and use rate of perceived exertion (RPE) scale Yes Yes      Intervention Provide education and explanation on how to use RPE scale Provide education and explanation on how to use RPE scale      Expected Outcomes Short Term: Able to use RPE daily in rehab to express subjective intensity level;Long Term:  Able to use RPE to guide intensity level when exercising independently Short Term: Able to use RPE daily in rehab to express subjective intensity level;Long Term:  Able to use RPE to guide intensity level when exercising independently      Able to understand and use Dyspnea scale Yes Yes      Intervention Provide education and explanation on how to use Dyspnea scale Provide education and explanation on how to use Dyspnea scale      Expected Outcomes Short Term: Able to use Dyspnea scale daily in rehab to express subjective sense of shortness of breath during exertion;Long Term: Able to use Dyspnea scale to guide intensity level when exercising  independently Short Term: Able to use Dyspnea scale daily in rehab to express subjective sense of shortness of breath during exertion;Long Term: Able to use Dyspnea scale to guide intensity level when exercising independently      Knowledge and understanding of Target Heart Rate Range (THRR) Yes Yes      Intervention Provide education and  explanation of THRR including how the numbers were predicted and where they are located for reference Provide education and explanation of THRR including how the numbers were predicted and where they are located for reference      Expected Outcomes Short Term: Able to state/look up THRR;Long Term: Able to use THRR to govern intensity when exercising independently;Short Term: Able to use daily as guideline for intensity in rehab Short Term: Able to state/look up THRR;Long Term: Able to use THRR to govern intensity when exercising independently;Short Term: Able to use daily as guideline for intensity in rehab      Understanding of Exercise Prescription Yes Yes      Intervention Provide education, explanation, and written materials on patient's individual exercise prescription Provide education, explanation, and written materials on patient's individual exercise prescription      Expected Outcomes Short Term: Able to explain program exercise prescription;Long Term: Able to explain home exercise prescription to exercise independently Short Term: Able to explain program exercise prescription;Long Term: Able to explain home exercise prescription to exercise independently               Exercise Goals Re-Evaluation :  Exercise Goals Re-Evaluation     [place] Name 08/06/22 1158             Exercise Goal Re-Evaluation   Exercise Goals Review Increase Physical Activity;Increase Strength and Stamina;Able to understand and use rate of perceived exertion (RPE) scale;Able to understand and use Dyspnea scale;Knowledge and understanding of Target Heart Rate Range (THRR);Understanding of Exercise Prescription       Comments Susan Bray has completed 1 exercise session. She exercises on the recumbent bike and Nustep for 15 min. She averages 1.9 METs at level 1 on the recumbent bike and 2.1 METs at level 1 on the Nustep. She performs the warmup and cooldown standing without limitations. It is too soon to note any  discernable progressions. Will continue to monitor and progress as able.       Expected Outcomes Through exercise at rehab and home, the patient will decrease shortness of breath with daily activities and feel confident in carrying out an exercise regimen at home.                Discharge Exercise Prescription (Final Exercise Prescription Changes):  Exercise Prescription Changes - 08/07/22 1200       Response to Exercise   Blood Pressure (Admit) 118/62    Blood Pressure (Exercise) 130/60    Blood Pressure (Exit) 106/60    Heart Rate (Admit) 60 bpm    Heart Rate (Exercise) 75 bpm    Heart Rate (Exit) 59 bpm    Oxygen Saturation (Admit) 99 %    Oxygen Saturation (Exercise) 97 %    Oxygen Saturation (Exit) 98 %    Rating of Perceived Exertion (Exercise) 13    Perceived Dyspnea (Exercise) 1    Duration Progress to 30 minutes of  aerobic without signs/symptoms of physical distress    Intensity Other (comment)   40-80% of HRR     Progression   Progression Continue to progress workloads to maintain intensity without signs/symptoms of physical distress.  Resistance Training   Training Prescription Yes    Weight Red bands    Reps 10-15    Time 10 Minutes      Recumbant Bike   Level 1    Minutes 15    METs 1.8      NuStep   Level 2    SPM 70    Minutes 15    METs 2             Nutrition:  Target Goals: Understanding of nutrition guidelines, daily intake of sodium '1500mg'$ , cholesterol '200mg'$ , calories 30% from fat and 7% or less from saturated fats, daily to have 5 or more servings of fruits and vegetables.  Biometrics:  Pre Biometrics - 07/27/22 0910       Pre Biometrics   Grip Strength 19 kg              Nutrition Therapy Plan and Nutrition Goals:  Nutrition Therapy & Goals - 08/02/22 1132       Nutrition Therapy   Diet Heart Healthy diet      Personal Nutrition Goals   Nutrition Goal Patient to identify strategies for weight  maintanence/weight gain of 0.5-2.0# per week.    Personal Goal #2 Patient to reduce sodium intake to '2000mg'$  per day    Personal Goal #3 Patient to identify food sources and limit daily intake of saturated fat, trans fat, sodium, and refined carbohydrates    Comments Susan Bray reports concerns of GI discomfort including gas and bloating over the last month; she does have appointment with Gastroenterology in November. She also has history of IBS, GERD. She is down ~12# of unintentional weight loss since 09/21/2021 at 134#. In addition, per cardiology notes, she reports not tolerating statin medications and declines PCSK9 intervention. We discussed strategies for weight maintenance and weight gain; she is amicable to introducing 1 nutrition supplements such as Boost or Ensure daily. She reports "grazing"/snacking behaviors throughout the day and often eats out for dinner.      Intervention Plan   Intervention Prescribe, educate and counsel regarding individualized specific dietary modifications aiming towards targeted core components such as weight, hypertension, lipid management, diabetes, heart failure and other comorbidities.;Nutrition handout(s) given to patient.    Expected Outcomes Short Term Goal: Understand basic principles of dietary content, such as calories, fat, sodium, cholesterol and nutrients.;Short Term Goal: A plan has been developed with personal nutrition goals set during dietitian appointment.;Long Term Goal: Adherence to prescribed nutrition plan.             Nutrition Assessments:  Nutrition Assessments - 08/02/22 1150       Rate Your Plate Scores   Pre Score 57            MEDIFICTS Score Key: ?70 Need to make dietary changes  40-70 Heart Healthy Diet ? 40 Therapeutic Level Cholesterol Diet  Flowsheet Row PULMONARY REHAB OTHER RESPIRATORY from 08/02/2022 in [place]  Picture Your Plate Total Score on Admission 57      Picture Your  Plate Scores: <19 Unhealthy dietary pattern with much room for improvement. 41-50 Dietary pattern unlikely to meet recommendations for good health and room for improvement. 51-60 More healthful dietary pattern, with some room for improvement.  >60 Healthy dietary pattern, although there may be some specific behaviors that could be improved.    Nutrition Goals Re-Evaluation:  Nutrition Goals Re-Evaluation     [place] Name 08/02/22 1132  Goals   Current Weight 124 lb 5.4 oz (56.4 kg)       Comment LDL 143, triglycerides 161, cholesterol 221       Expected Outcome Susan Bray reports concerns of GI discomfort including gas and bloating over the last month; she does have appointment with Gastroenterology in November. She also has history of IBS, GERD. She is down ~10# of unintentional weight loss since 09/21/2021 at 134#. In addition, per cardiology notes, she reports not tolerating statin medications and declines PCSK9 intervention. We discussed strategies for weight maintenance and weight gain; she is amicable to introducing 1 nutrition supplements such as Boost or Ensure daily. She reports "grazing"/snacking behaviors throughout the day and often eats out for dinner.                Nutrition Goals Discharge (Final Nutrition Goals Re-Evaluation):  Nutrition Goals Re-Evaluation - 08/02/22 1132       Goals   Current Weight 124 lb 5.4 oz (56.4 kg)    Comment LDL 143, triglycerides 161, cholesterol 221    Expected Outcome Susan Bray reports concerns of GI discomfort including gas and bloating over the last month; she does have appointment with Gastroenterology in November. She also has history of IBS, GERD. She is down ~10# of unintentional weight loss since 09/21/2021 at 134#. In addition, per cardiology notes, she reports not tolerating statin medications and declines PCSK9 intervention. We discussed strategies for weight maintenance and weight gain; she is amicable to introducing 1 nutrition  supplements such as Boost or Ensure daily. She reports "grazing"/snacking behaviors throughout the day and often eats out for dinner.             Psychosocial: Target Goals: Acknowledge presence or absence of significant depression and/or stress, maximize coping skills, provide positive support system. Participant is able to verbalize types and ability to use techniques and skills needed for reducing stress and depression.  Initial Review & Psychosocial Screening:  Initial Psych Review & Screening - 07/27/22 0920       Initial Review   Current issues with None Identified    Comments Lives w/ son. Does not report psychosocial barriers to exercise.      Family Dynamics   Good Support System? Yes    Comments son      Barriers   Psychosocial barriers to participate in program There are no identifiable barriers or psychosocial needs.      Screening Interventions   Interventions Encouraged to exercise             Quality of Life Scores:  Scores of 19 and below usually indicate a poorer quality of life in these areas.  A difference of  2-3 points is a clinically meaningful difference.  A difference of 2-3 points in the total score of the Quality of Life Index has been associated with significant improvement in overall quality of life, self-image, physical symptoms, and general health in studies assessing change in quality of life.  PHQ-9: Review Flowsheet       07/27/2022  Depression screen PHQ 2/9  Decreased Interest 0  Down, Depressed, Hopeless 0  PHQ - 2 Score 0  Altered sleeping 0  Tired, decreased energy 0  Change in appetite 0  Feeling bad or failure about yourself  0  Trouble concentrating 0  Moving slowly or fidgety/restless 0  Suicidal thoughts 0  PHQ-9 Score 0  Difficult doing work/chores Not difficult at all   Interpretation of Total Score  Total Score Depression  Severity:  1-4 = Minimal depression, 5-9 = Mild depression, 10-14 = Moderate depression,  15-19 = Moderately severe depression, 20-27 = Severe depression   Psychosocial Evaluation and Intervention:  Psychosocial Evaluation - 07/27/22 0923       Psychosocial Evaluation & Interventions   Interventions Encouraged to exercise with the program and follow exercise prescription    Comments Susan Bray does not report any psychosocial concerns.    Expected Outcomes For Susan Bray to participate in Pulmonary Rehab free of psychosocial concerns.    Continue Psychosocial Services  No Follow up required             Psychosocial Re-Evaluation:  Psychosocial Re-Evaluation     [place] Name 08/10/22 [phone]             Psychosocial Re-Evaluation   Current issues with None Identified       Comments Susan Bray has been participating in rehab free of psychosocial concerns. She feels that the program has helped improved her conditioning level.       Expected Outcomes For Susan Bray to participate in rehab free of psychosocial concerns.       Interventions Encouraged to attend Pulmonary Rehabilitation for the exercise       Continue Psychosocial Services  No Follow up required                Psychosocial Discharge (Final Psychosocial Re-Evaluation):  Psychosocial Re-Evaluation - 08/10/22 4268       Psychosocial Re-Evaluation   Current issues with None Identified    Comments Susan Bray has been participating in rehab free of psychosocial concerns. She feels that the program has helped improved her conditioning level.    Expected Outcomes For Susan Bray to participate in rehab free of psychosocial concerns.    Interventions Encouraged to attend Pulmonary Rehabilitation for the exercise    Continue Psychosocial Services  No Follow up required             Education: Education Goals: Education classes will be provided on a weekly basis, covering required topics. Participant will state understanding/return demonstration of topics presented.  Learning Barriers/Preferences:  Learning Barriers/Preferences -  07/27/22 0924       Learning Barriers/Preferences   Learning Barriers Sight   Bilat eye surgery   Learning Preferences None             Education Topics: Introduction to Pulmonary Rehab Group instruction provided by PowerPoint, verbal discussion, and written material to support subject matter. Instructor reviews what Pulmonary Rehab is, the purpose of the program, and how patients are referred.     Know Your Numbers Group instruction that is supported by a PowerPoint presentation. Instructor discusses importance of knowing and understanding resting, exercise, and post-exercise oxygen saturation, heart rate, and blood pressure. Oxygen saturation, heart rate, blood pressure, rating of perceived exertion, and dyspnea are reviewed along with a normal range for these values.  Flowsheet Row PULMONARY REHAB OTHER RESPIRATORY from 08/09/2022 in [place]  Date 08/09/22  Educator EP  Instruction Review Code 1- Verbalizes Understanding       Exercise for the Pulmonary Patient Group instruction that is supported by a PowerPoint presentation. Instructor discusses benefits of exercise, core components of exercise, frequency, duration, and intensity of an exercise routine, importance of utilizing pulse oximetry during exercise, safety while exercising, and options of places to exercise outside of rehab.       MET Level  Group instruction provided by PowerPoint, verbal discussion, and written material to support  subject matter. Instructor reviews what METs are and how to increase METs.    Pulmonary Medications Verbally interactive group education provided by instructor with focus on inhaled medications and proper administration.   Anatomy and Physiology of the Respiratory System Group instruction provided by PowerPoint, verbal discussion, and written material to support subject matter. Instructor reviews respiratory cycle and anatomical components of the  respiratory system and their functions. Instructor also reviews differences in obstructive and restrictive respiratory diseases with examples of each.  Flowsheet Row PULMONARY REHAB OTHER RESPIRATORY from 08/09/2022 in [place]  Date 08/02/22  Educator EP  Instruction Review Code 1- Verbalizes Understanding       Oxygen Safety Group instruction provided by PowerPoint, verbal discussion, and written material to support subject matter. There is an overview of "What is Oxygen" and "Why do we need it".  Instructor also reviews how to create a safe environment for oxygen use, the importance of using oxygen as prescribed, and the risks of noncompliance. There is a brief discussion on traveling with oxygen and resources the patient may utilize.   Oxygen Use Group instruction provided by PowerPoint, verbal discussion, and written material to discuss how supplemental oxygen is prescribed and different types of oxygen supply systems. Resources for more information are provided.    Breathing Techniques Group instruction that is supported by demonstration and informational handouts. Instructor discusses the benefits of pursed lip and diaphragmatic breathing and detailed demonstration on how to perform both.     Risk Factor Reduction Group instruction that is supported by a PowerPoint presentation. Instructor discusses the definition of a risk factor, different risk factors for pulmonary disease, and how the heart and lungs work together.   MD Day A group question and answer session with a medical doctor that allows participants to ask questions that relate to their pulmonary disease state.   Nutrition for the Pulmonary Patient Group instruction provided by PowerPoint slides, verbal discussion, and written materials to support subject matter. The instructor gives an explanation and review of healthy diet recommendations, which includes a discussion on weight  management, recommendations for fruit and vegetable consumption, as well as protein, fluid, caffeine, fiber, sodium, sugar, and alcohol. Tips for eating when patients are short of breath are discussed.    Other Education Group or individual verbal, written, or video instructions that support the educational goals of the pulmonary rehab program.    Knowledge Questionnaire Score:  Knowledge Questionnaire Score - 07/27/22 1110       Knowledge Questionnaire Score   Pre Score 18/18             Core Components/Risk Factors/Patient Goals at Admission:  Personal Goals and Risk Factors at Admission - 07/27/22 0925       Core Components/Risk Factors/Patient Goals on Admission   Improve shortness of breath with ADL's Yes    Intervention Provide education, individualized exercise plan and daily activity instruction to help decrease symptoms of SOB with activities of daily living.    Expected Outcomes Short Term: Improve cardiorespiratory fitness to achieve a reduction of symptoms when performing ADLs;Long Term: Be able to perform more ADLs without symptoms or delay the onset of symptoms    Increase knowledge of respiratory medications and ability to use respiratory devices properly  Yes    Intervention Provide education and demonstration as needed of appropriate use of medications, inhalers, and oxygen therapy.    Expected Outcomes Short Term: Achieves understanding of medications use. Understands that oxygen is  a medication prescribed by physician. Demonstrates appropriate use of inhaler and oxygen therapy.;Long Term: Maintain appropriate use of medications, inhalers, and oxygen therapy.             Core Components/Risk Factors/Patient Goals Review:   Goals and Risk Factor Review     Row Name 08/10/22 0645             Core Components/Risk Factors/Patient Goals Review   Personal Goals Review Improve shortness of breath with ADL's;Develop more efficient breathing techniques such as  purse lipped breathing and diaphragmatic breathing and practicing self-pacing with activity.;Increase knowledge of respiratory medications and ability to use respiratory devices properly.       Review Susan Bray has completed 3 exercise sessions. She has increased her METs on the recumbent bike and Nustep. She feels that her conditioning level has improved since starting rehab. Susan Bray has currently learned more about her disease. Will review medication later in the program. Will continue assess.       Expected Outcomes See admission goals.                Core Components/Risk Factors/Patient Goals at Discharge (Final Review):   Goals and Risk Factor Review - 08/10/22 0645       Core Components/Risk Factors/Patient Goals Review   Personal Goals Review Improve shortness of breath with ADL's;Develop more efficient breathing techniques such as purse lipped breathing and diaphragmatic breathing and practicing self-pacing with activity.;Increase knowledge of respiratory medications and ability to use respiratory devices properly.    Review Susan Bray has completed 3 exercise sessions. She has increased her METs on the recumbent bike and Nustep. She feels that her conditioning level has improved since starting rehab. Susan Bray has currently learned more about her disease. Will review medication later in the program. Will continue assess.    Expected Outcomes See admission goals.             ITP Comments:ITP REVIEW Pt is making expected progress toward pulmonary rehab goals after completing 4 sessions. Recommend continued exercise, life style modification, education, and utilization of breathing techniques to increase stamina and strength and decrease shortness of breath with exertion.    Comments: Dr. Rodman Pickle is Medical Director for Pulmonary Rehab at [facility].

## 2022-08-16 ENCOUNTER — Encounter (HOSPITAL_COMMUNITY)
Admission: RE | Admit: 2022-08-16 | Discharge: 2022-08-16 | Disposition: A | Discharge status: Home / Self Care | Payer: PPO | Source: Ambulatory Visit | Attending: Pulmonary Disease | Admitting: Pulmonary Disease

## 2022-08-16 VITALS — Wt 124.1 lb

## 2022-08-16 DIAGNOSIS — J849 Interstitial pulmonary disease, unspecified: Secondary | ICD-10-CM | POA: Diagnosis not present

## 2022-08-16 NOTE — Progress Notes (Signed)
Daily Session Note  Patient Details  Name: Susan Bray MRN: [id number] Date of Birth: 10/06/1946 Referring Provider:   Flowsheet Row Pulmonary Rehab Walk Test from 07/27/2022 in [facility]  Referring Provider [name]       Encounter Date: 08/16/2022  Check In:  Session Check In - 08/16/22 1118       Check-In   Supervising physician immediately available to respond to emergencies [facility] - Physician supervision    Physician(s) [place]    Location [facility]    Staff Present Lisa Hughes, RN;Carlette Carlton, RN, BSN;Kaylee Davis, MS, ACSM-CEP, Exercise Physiologist;David Makemson, MS, ACSM-CEP, CCRP, Exercise Physiologist;Samantha Spain, RD, LDN;Jetta Walker BS, ACSM-CEP, Exercise Physiologist;  BS, ACSM-CEP, Exercise Physiologist    Virtual Visit No    Medication changes reported     No    Fall or balance concerns reported    No    Tobacco Cessation No Change    Warm-up and Cool-down Performed as group-led instruction    Resistance Training Performed Yes    VAD Patient? No    PAD/SET Patient? No      Pain Assessment   Currently in Pain? No/denies             Capillary Blood Glucose: No results found for this or any previous visit (from the past 24 hour(s)).   Exercise Prescription Changes - 08/16/22 1200       Response to Exercise   Blood Pressure (Admit) 114/54    Blood Pressure (Exercise) 132/60    Blood Pressure (Exit) 104/62    Heart Rate (Admit) 64 bpm    Heart Rate (Exercise) 81 bpm    Heart Rate (Exit) 64 bpm    Oxygen Saturation (Admit) 100 %    Oxygen Saturation (Exercise) 100 %    Oxygen Saturation (Exit) 99 %    Rating of Perceived Exertion (Exercise) 13    Perceived Dyspnea (Exercise) 1    Duration Continue with 30 min of aerobic exercise without signs/symptoms of physical distress.    Intensity THRR unchanged      Progression   Progression Continue to progress workloads to maintain  intensity without signs/symptoms of physical distress.      Resistance Training   Training Prescription Yes    Weight Red bands    Reps 10-15    Time 10 Minutes      Interval Training   Interval Training No      Recumbant Bike   Level 2    Minutes 15    METs 2      NuStep   Level 3    SPM 70    Minutes 15    METs 2.2             Social History   Tobacco Use  Smoking Status Never  Smokeless Tobacco Never    Goals Met:  Independence with exercise equipment Exercise tolerated well No report of concerns or symptoms today Strength training completed today  Goals Unmet:  Not Applicable  Comments: Service time is from 1002 to 1136.    Dr. Jane Ellison is Medical Director for Pulmonary Rehab at [facility].  

## 2022-08-21 ENCOUNTER — Encounter (HOSPITAL_COMMUNITY): Payer: PPO

## 2022-08-22 ENCOUNTER — Other Ambulatory Visit (HOSPITAL_COMMUNITY): Payer: Self-pay

## 2022-08-22 ENCOUNTER — Telehealth: Payer: Self-pay

## 2022-08-22 NOTE — Telephone Encounter (Signed)
PA request received through [facility] from [facility] for Azathioprine '50mg'$  tablets.  PA has been faxed to plan and is awaiting determination.  Key: [id number]

## 2022-08-23 ENCOUNTER — Ambulatory Visit: Payer: PPO | Attending: Cardiology

## 2022-08-23 ENCOUNTER — Encounter (HOSPITAL_COMMUNITY)
Admission: RE | Admit: 2022-08-23 | Discharge: 2022-08-23 | Disposition: A | Discharge status: Home / Self Care | Payer: PPO | Source: Ambulatory Visit | Attending: Pulmonary Disease | Admitting: Pulmonary Disease

## 2022-08-23 ENCOUNTER — Encounter: Payer: Self-pay | Admitting: Cardiology

## 2022-08-23 ENCOUNTER — Encounter: Payer: PPO | Admitting: Cardiology

## 2022-08-23 VITALS — BP 126/76 | HR 81 | Ht 63.0 in | Wt 127.6 lb

## 2022-08-23 DIAGNOSIS — Z7901 Long term (current) use of anticoagulants: Secondary | ICD-10-CM | POA: Diagnosis not present

## 2022-08-23 DIAGNOSIS — Z79899 Other long term (current) drug therapy: Secondary | ICD-10-CM | POA: Insufficient documentation

## 2022-08-23 DIAGNOSIS — R002 Palpitations: Secondary | ICD-10-CM | POA: Insufficient documentation

## 2022-08-23 DIAGNOSIS — I480 Paroxysmal atrial fibrillation: Secondary | ICD-10-CM | POA: Insufficient documentation

## 2022-08-23 DIAGNOSIS — J849 Interstitial pulmonary disease, unspecified: Secondary | ICD-10-CM | POA: Diagnosis not present

## 2022-08-23 DIAGNOSIS — I4891 Unspecified atrial fibrillation: Secondary | ICD-10-CM | POA: Diagnosis not present

## 2022-08-23 DIAGNOSIS — I2510 Atherosclerotic heart disease of native coronary artery without angina pectoris: Secondary | ICD-10-CM | POA: Diagnosis not present

## 2022-08-23 DIAGNOSIS — E785 Hyperlipidemia, unspecified: Secondary | ICD-10-CM | POA: Insufficient documentation

## 2022-08-23 DIAGNOSIS — I10 Essential (primary) hypertension: Secondary | ICD-10-CM | POA: Insufficient documentation

## 2022-08-23 MED ORDER — APIXABAN 5 MG PO TABS: 5.0000 mg | ORAL_TABLET | Freq: Two times a day (BID) | ORAL | 1 refills | Status: DC

## 2022-08-23 MED ORDER — DILTIAZEM HCL ER COATED BEADS 120 MG PO CP24: 120.0000 mg | ORAL_CAPSULE | Freq: Every day | ORAL | 3 refills | Status: DC

## 2022-08-23 NOTE — Progress Notes (Signed)
Cardiology Office Note   Date:  08/23/2022   ID:  Susan Bray, DOB 11/02/1945, MRN [id number]  PCP:  Collene Leyden, MD  Cardiologist:   Minus Breeding, MD Referring:  Collene Leyden, MD  Chief Complaint  Patient presents with   Atrial Fibrillation       History of Present Illness: Susan Bray is a 76 y.o. female who presents for evaluation of dizziness, lightheadedness, arm discomfort.   I saw her in 2017.    She was found to have non obstructive and branch vessel disease and was managed medically.   I saw her in June and she had some increased shortness of breath.  She has not had cardiac evaluation since that time.  Perfusion imaging demonstrated no evidence of ischemia and was thought to be low risk.  She was at pulmonary rehab today.  She was noted to be in atrial fibrillation with rapid rate and was added to my schedule.  She does not really feel the palpitations.  She might of had a little last night.  She has not had any presyncope or syncope.  She denies any chest pressure, neck or arm discomfort.  She has her chronic dyspnea related to interstitial lung disease.  She is not having any new PND or orthopnea.  She has had no chest pressure, neck or arm discomfort.  Weight gain or edema.  She lives with her son and helps take care of 2 grandchildren.   Past Medical History:  Diagnosis Date   CAD (coronary artery disease)    Cataract    Hyperlipidemia    IBS (irritable bowel syndrome)    VIN III (vulvar intraepithelial neoplasia III)     Past Surgical History:  Procedure Laterality Date   ABDOMINAL HYSTERECTOMY  1978   CARDIAC CATHETERIZATION N/A 12/06/2015   Procedure: Left Heart Cath and Coronary Angiography;  Surgeon: Belva Crome, MD;  Location: [facility];  Service: Cardiovascular;  Laterality: N/A;   CATARACT EXTRACTION  08/17/04 ; 09/12/2004   left eye and right eye   CATARACT EXTRACTION     laser vaporization of hg vaginal dysplasia  12/2006   p & e  repairs with cardinal uterosatral coloposusension (pelvic area repair)  03/03/09   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 180 tablet 1   azaTHIOprine (IMURAN) 50 MG tablet Take 3 tablets (150 mg total) by mouth daily. 90 tablet 5   clobetasol ointment (TEMOVATE) 0.05 % Apply topically 2 (two) times daily.     diltiazem (CARDIZEM CD) 120 MG 24 hr capsule Take 1 capsule (120 mg total) by mouth daily. 90 capsule 3   ezetimibe (ZETIA) 10 MG tablet Take 10 mg by mouth daily.     fenofibrate micronized (LOFIBRA) 134 MG capsule Take 134 mg by mouth daily before breakfast.     predniSONE (DELTASONE) 10 MG tablet Take 5 mg by mouth daily.     SYNTHROID 50 MCG tablet Take by mouth.     No current facility-administered medications for this visit.    Allergies:   Colesevelam and Statins     ROS:  Please see the history of present illness.   Otherwise, review of systems are positive for none.  All other systems are reviewed and negative.    PHYSICAL EXAM: VS:  BP 126/76   Pulse 81   Ht '5\' 3"'$  (1.6 m)   Wt 127 lb 9.6 oz (57.9 kg)   SpO2 99%   BMI 22.60 kg/m  , BMI Body mass index is 22.6 kg/m. GENERAL:  Well appearing NECK:  No jugular venous distention, waveform within normal limits, carotid upstroke brisk and symmetric, no bruits, no thyromegaly LUNGS:  Clear to auscultation bilaterally CHEST:  Unremarkable HEART:  PMI not displaced or sustained,S1 and S2 within normal limits, no S3, no S4 no clicks, no rubs, no murmurs, irregular  ABD:  Flat, positive bowel sounds normal in frequency in pitch, no bruits, no rebound, no guarding, no midline pulsatile mass, no hepatomegaly, no splenomegaly EXT:  2 plus pulses throughout, no edema, no cyanosis no clubbing   EKG:  EKG is   ordered today. The ekg ordered today demonstrates sinus rhythm, rate 74, axis within normal limits, intervals within normal limits, no acute ST-T wave changes.     Recent Labs: 08/13/2022: ALT 12; BUN 13; Creatinine, Ser 1.03; Hemoglobin 13.6; Platelets 283.0; Potassium 3.5; Sodium 139    Lipid Panel    Component Value Date/Time   CHOL 149 11/15/2015 0901   TRIG 117 11/15/2015 0901   HDL 47 11/15/2015 0901   CHOLHDL 3.2 11/15/2015 0901   VLDL 23 11/15/2015 0901   LDLCALC 79 11/15/2015 0901      Wt Readings from Last 3 Encounters:  08/23/22 127 lb 9.6 oz (57.9 kg)  08/16/22 124 lb 1.9 oz (56.3 kg)  08/13/22 122 lb 12.8 oz (55.7 kg)    Cardiac cath 2017  Intervention   Other studies Reviewed: Additional studies/ records that were reviewed today include: Pulmonary records. Review of the above records demonstrates:  Please see elsewhere in the note.     ASSESSMENT AND PLAN:  PAF: The patient is having paroxysmal atrial fibrillation.  I am going to check an echocardiogram.  I am going to stop aspirin.  CHA2DS2-VASc score is 3.  I will start Eliquis 5 mg twice daily.  She has no contraindication.  I am going to check a 3-day Zio patch.  She is going to get a pulse ox to follow her heart rate.  I will start Cardizem 120 mg daily since she was going fast when she was in A-fib.  CAD: She had had a low risk perfusion study earlier this year and no change in symptoms.   DYSLIPIDEMIA:   LDL was 143.  She would not take a statin as she says she has not tolerated.  I tried to refer her for PCSK9 inhibitor initiation but she does not want to start this.  Continue with diet control unfortunately.   HTN: Her blood pressure is  at target.  No change in therapy.  She will keep an eye on this when she starts her medication so that it does not run low.  Current medicines are reviewed at length with the patient today.  The patient does not have concerns regarding medicines.  The following  changes have been made: As above  Labs/ tests ordered today include:   Orders Placed This Encounter  Procedures   LONG TERM MONITOR (3-14 DAYS)   ECHOCARDIOGRAM COMPLETE      Disposition:   FU with APP in 3  months   Signed, Minus Breeding, MD  08/23/2022 1:25 PM    [facility]

## 2022-08-23 NOTE — Patient Instructions (Signed)
Medication Instructions:  STOP: Asprin 81 mg daily START: Eliquis 5 mg twice a day START: Cardizem 120 mg daily  *If you need a refill on your cardiac medications before your next appointment, please call your pharmacy*   Lab Work: None ordered today   Testing/Procedures: Your physician has requested that you have an echocardiogram. Echocardiography is a painless test that uses sound waves to create images of your heart. It provides your doctor with information about the size and shape of your heart and how well your heart's chambers and valves are working. This procedure takes approximately one hour. There are no restrictions for this procedure. [facility] has recommended that you wear a 3 day Zio AT Live monitor.   This monitor is a medical device that records the heart's electrical activity. Doctors most often use these monitors to diagnose arrhythmias. Arrhythmias are problems with the speed or rhythm of the heartbeat. The monitor is a small device applied to your chest. You can wear one while you do your normal daily activities. While wearing this monitor if you have any symptoms to push the button and record what you felt. Once you have worn this monitor for the period of time provider prescribed (Usually 14 days), you will return the monitor device in the postage paid box/bag. Once it is returned they will download the data collected and provide [place] with a report which the provider will then review and we will call you with those results.   Important tips:  Avoid showering during the first 24 hours of wearing the monitor. Avoid excessive sweating to help maximize wear time. Do not submerge the device, no hot tubs, and no swimming pools. Keep any lotions or oils away from the patch. After 24 hours you may shower with the patch on. Take brief showers with your back facing the shower head.  Do not remove patch once it has been placed because that  will interrupt data and decrease adhesive wear time. Push the button when you have any symptoms and write down what you were feeling. Once you have completed wearing your monitor, remove and place into box which has postage paid and place in your outgoing mailbox.  If for some reason you have misplaced your box then call our office and we can provide another box and/or mail it off for you. Keep the transmitter within 10 feet at all times.  Expect a welcome phone call within 09-73 hrs of application from [place].  This call will include your copay information, so please answer any unknown phone calls while wearing Zio (it could also be important information about your heart) The envelope to return Zio is in the back of the transmitter. Removal instructions are on the last page of the symptom diary.  Place the patch sticky side up inside the transmitter and the symptom diary inside the envelope to return on your last wear day inside your mailbox or any USPS mailbox.    Follow-Up: At [facility], you and your health needs are our priority.  As part of our continuing mission to provide you with exceptional heart care, we have created designated Provider Care Teams.  These Care Teams include your primary Cardiologist (physician) and Advanced Practice Providers (APPs -  Physician Assistants and Nurse Practitioners) who all work together to provide you with the care you need, when you need it.  We recommend signing up for the patient portal called "MyChart".  Sign up  information is provided on this After Visit Summary.  MyChart is used to connect with patients for Virtual Visits (Telemedicine).  Patients are able to view lab/test results, encounter notes, upcoming appointments, etc.  Non-urgent messages can be sent to your provider as well.   To learn more about what you can do with MyChart, go to [email or web address].    Your next appointment:   3 month(s)  The format for your next appointment:    In Person  Provider:   APP

## 2022-08-23 NOTE — Progress Notes (Unsigned)
Enrolled for [employer] to mail a ZIO XT long term holter monitor to the patients address on file.  

## 2022-08-23 NOTE — Progress Notes (Signed)
1013 -1213 Pulmonary Rehab Genoveva came in for the pulmonary rehab program this am. When checking her oxygen saturation noted  that her heart rate was 130. Felt her pulse and it was irregular.Placed her on the heart monitor and noted that she was in Atrial fibrillation rate 130-160's. Pt states that she could feel some palpations. She states that she felt lightheaded which is not unusual for her.Per patient she has "felt this for the past year." Her VS 112/70 HR 130 room air saturation 100%. Placed call to  the on call MD, Dr. Erin Fulling. Obtained an EKG showed Atrial Fibrillation rate 129. Dr. Erin Fulling contacted Dr. Percival Spanish and they want Costella to come to the [facility] office to be worked in to see Dr. Percival Spanish.Called Elgene's son Ronalee Belts. He is coming to pick her to take her to the office.BP 121/86 134/76. Maebry discharged in stable condition via wheelchair with her son Legrand Como going to Dr. Alver Fisher office.

## 2022-08-23 NOTE — Progress Notes (Signed)
PCCM Note:  Called by pulmonary rehab staff for an increased heart rate into the 130s to 160s at rest. Patient complains of palpitations. BP is 120s/80s and SpO2 is 100% on room air.   She denies history of GI bleeds of strokes.   EKG shows atrial fibrillation with RVR.   I spoke with her Cardiologist, Dr. Percival Spanish, who will see her in the [facility] Cardiology clinic today for further evaluation and treatment.  Susan Jackson, MD [facility] Office: [phone]   See Amion for personal pager PCCM on call pager [phone] until 7pm. Please call Elink 7p-7a. [phone]

## 2022-08-27 ENCOUNTER — Encounter: Payer: Self-pay | Admitting: *Deleted

## 2022-08-27 ENCOUNTER — Encounter: Payer: Self-pay | Admitting: Cardiology

## 2022-08-27 ENCOUNTER — Telehealth (HOSPITAL_COMMUNITY): Payer: Self-pay | Admitting: Family Medicine

## 2022-08-27 ENCOUNTER — Telehealth (HOSPITAL_COMMUNITY): Payer: Self-pay

## 2022-08-27 NOTE — Addendum Note (Signed)
Addended by: Angelena Form on: 08/27/2022 11:02 AM   Modules accepted: Orders

## 2022-08-27 NOTE — Progress Notes (Signed)
Patient ID: Susan Bray, female   DOB: [birth year], 76 y.o.   MRN: [id number] Patient received  ZIO XT which was not activated ([id number]).  Replacement monitor to be expedited to patient 08/27/22.

## 2022-08-27 NOTE — Telephone Encounter (Signed)
Called Laia to check up on her after seeing Dr. Percival Spanish. Left a message with department number.

## 2022-08-28 ENCOUNTER — Encounter (HOSPITAL_COMMUNITY)
Admission: RE | Admit: 2022-08-28 | Discharge: 2022-08-28 | Disposition: A | Discharge status: Home / Self Care | Payer: PPO | Source: Ambulatory Visit | Attending: Pulmonary Disease | Admitting: Pulmonary Disease

## 2022-08-28 VITALS — Wt 125.4 lb

## 2022-08-28 DIAGNOSIS — J849 Interstitial pulmonary disease, unspecified: Secondary | ICD-10-CM

## 2022-08-28 DIAGNOSIS — R002 Palpitations: Secondary | ICD-10-CM | POA: Diagnosis not present

## 2022-08-28 NOTE — Progress Notes (Signed)
Daily Session Note  Patient Details  Name: Susan Bray MRN: [id number] Date of Birth: 06-Jun-1946 Referring Provider:   [name] Pulmonary Rehab Walk Test from 07/27/2022 in [place]  Referring Provider [name]       Encounter Date: 08/28/2022  Check In:  Session Check In - 08/28/22 1148       Check-In   Supervising physician immediately available to respond to emergencies [facility] - Physician supervision    Physician(s) [name]    Location [facility]    Staff Present Elmon Else, MS, ACSM-CEP, Exercise Physiologist; Lizbeth Bark, MS, ACSM-CEP, CCRP, Exercise Physiologist    Virtual Visit No    Medication changes reported     No    Fall or balance concerns reported    No    Tobacco Cessation No Change    Warm-up and Cool-down Performed as group-led instruction    Resistance Training Performed Yes    VAD Patient? No    PAD/SET Patient? No      Pain Assessment   Currently in Pain? No/denies    Multiple Pain Sites No             Capillary Blood Glucose: No results found for this or any previous visit (from the past 24 hour(s)).   Exercise Prescription Changes - 08/28/22 1200       Response to Exercise   Blood Pressure (Admit) 114/62    Blood Pressure (Exercise) 110/58    Blood Pressure (Exit) 120/66    Heart Rate (Admit) 95 bpm    Heart Rate (Exercise) 73 bpm    Heart Rate (Exit) 63 bpm    Oxygen Saturation (Admit) 98 %    Oxygen Saturation (Exercise) 96 %    Oxygen Saturation (Exit) 97 %    Rating of Perceived Exertion (Exercise) 13    Perceived Dyspnea (Exercise) 1    Duration Continue with 30 min of aerobic exercise without signs/symptoms of physical distress.    Intensity THRR unchanged      Progression   Progression Continue to progress workloads to maintain intensity without signs/symptoms of physical distress.      Resistance Training   Training Prescription Yes    Weight  Red bands    Reps 10-15    Time 10 Minutes      Recumbant Bike   Level 2    Minutes 15    METs 3.3      NuStep   Level 3    SPM 70    Minutes 15    METs 2.4             Social History   Tobacco Use  Smoking Status Never  Smokeless Tobacco Never    Goals Met:  Proper associated with RPD/PD & O2 Sat Exercise tolerated well No report of concerns or symptoms today Strength training completed today  Goals Unmet:  Not Applicable  Comments: Service time is from 1009 to [place]    Dr. Rodman Pickle is Medical Director for Pulmonary Rehab at [facility].

## 2022-08-28 NOTE — Telephone Encounter (Signed)
PA has been denied for Azathioprine due to not meeting required clinical criteria.  Denial letter has been attached in patients documents.

## 2022-08-29 DIAGNOSIS — I4891 Unspecified atrial fibrillation: Secondary | ICD-10-CM

## 2022-08-30 ENCOUNTER — Encounter (HOSPITAL_COMMUNITY)
Admission: RE | Admit: 2022-08-30 | Discharge: 2022-08-30 | Disposition: A | Discharge status: Home / Self Care | Payer: PPO | Source: Ambulatory Visit | Attending: Pulmonary Disease | Admitting: Pulmonary Disease

## 2022-08-30 DIAGNOSIS — R002 Palpitations: Secondary | ICD-10-CM

## 2022-08-30 DIAGNOSIS — J849 Interstitial pulmonary disease, unspecified: Secondary | ICD-10-CM

## 2022-08-30 NOTE — Progress Notes (Signed)
Daily Session Note  Patient Details  Name: Susan Bray MRN: [id number] Date of Birth: 03/03/1946 Referring Provider:   April Bray Pulmonary Rehab Walk Test from 07/27/2022 in [facility], & [place]  Referring Provider Susan Bray       Encounter Date: 08/30/2022  Check In:  Session Check In - 08/30/22 1204       Check-In   Supervising physician immediately available to respond to emergencies [facility] - Physician supervision    Physician(s) [name]    Location [facility]    Staff Present [name], [name], MS, ACSM-CEP, Exercise Physiologist    Virtual Visit No    Medication changes reported     No    Fall or balance concerns reported    No    Tobacco Cessation No Change    Warm-up and Cool-down Performed as group-led instruction    Resistance Training Performed Yes    VAD Patient? No    PAD/SET Patient? No      Pain Assessment   Currently in Pain? No/denies    Multiple Pain Sites No             Capillary Blood Glucose: No results found for this or any previous visit (from the past 24 hour(s)).    Social History   Tobacco Use  Smoking Status Never  Smokeless Tobacco Never    Goals Met:  Proper associated with RPD/PD & O2 Sat Exercise tolerated well No report of concerns or symptoms today Strength training completed today  Goals Unmet:  Not Applicable  Comments: Service time is from 1012 to 1139.    Dr. Rodman Bray is Medical Director for Pulmonary Rehab at [facility].

## 2022-09-02 DIAGNOSIS — Z1211 Encounter for screening for malignant neoplasm of colon: Secondary | ICD-10-CM | POA: Diagnosis not present

## 2022-09-03 DIAGNOSIS — R634 Abnormal weight loss: Secondary | ICD-10-CM | POA: Diagnosis not present

## 2022-09-03 DIAGNOSIS — J849 Interstitial pulmonary disease, unspecified: Secondary | ICD-10-CM | POA: Diagnosis not present

## 2022-09-03 DIAGNOSIS — Z79899 Other long term (current) drug therapy: Secondary | ICD-10-CM | POA: Diagnosis not present

## 2022-09-03 DIAGNOSIS — R21 Rash and other nonspecific skin eruption: Secondary | ICD-10-CM | POA: Diagnosis not present

## 2022-09-03 DIAGNOSIS — M3390 Dermatopolymyositis, unspecified, organ involvement unspecified: Secondary | ICD-10-CM | POA: Diagnosis not present

## 2022-09-03 DIAGNOSIS — R682 Dry mouth, unspecified: Secondary | ICD-10-CM | POA: Diagnosis not present

## 2022-09-03 DIAGNOSIS — M810 Age-related osteoporosis without current pathological fracture: Secondary | ICD-10-CM | POA: Diagnosis not present

## 2022-09-03 DIAGNOSIS — H04129 Dry eye syndrome of unspecified lacrimal gland: Secondary | ICD-10-CM | POA: Diagnosis not present

## 2022-09-03 DIAGNOSIS — M3500 Sicca syndrome, unspecified: Secondary | ICD-10-CM | POA: Diagnosis not present

## 2022-09-03 DIAGNOSIS — R5383 Other fatigue: Secondary | ICD-10-CM | POA: Diagnosis not present

## 2022-09-04 ENCOUNTER — Encounter (HOSPITAL_COMMUNITY)
Admission: RE | Admit: 2022-09-04 | Discharge: 2022-09-04 | Disposition: A | Discharge status: Home / Self Care | Payer: PPO | Source: Ambulatory Visit | Attending: Pulmonary Disease | Admitting: Pulmonary Disease

## 2022-09-04 DIAGNOSIS — R002 Palpitations: Secondary | ICD-10-CM | POA: Diagnosis not present

## 2022-09-04 DIAGNOSIS — J849 Interstitial pulmonary disease, unspecified: Secondary | ICD-10-CM

## 2022-09-04 NOTE — Progress Notes (Signed)
Daily Session Note  Patient Details  Name: Susan Bray MRN: [id number] Date of Birth: 1946-04-22 Referring Provider:   [name] Pulmonary Rehab Walk Test from 07/27/2022 in [facility], & [place]  Referring Provider [name]       Encounter Date: 09/04/2022  Check In:  Session Check In - 09/04/22 1130       Check-In   Supervising physician immediately available to respond to emergencies [facility] - Physician supervision    Physician(s) Shearon Stalls    Location [facility]    Staff Present Elmon Else, MS, ACSM-CEP, Exercise Physiologist; Ysidro Evert, RN;Randi Olen Cordial BS, ACSM-CEP, Exercise Physiologist    Virtual Visit No    Medication changes reported     No    Fall or balance concerns reported    No    Tobacco Cessation No Change    Warm-up and Cool-down Performed as group-led instruction    Resistance Training Performed Yes    VAD Patient? No    PAD/SET Patient? No      Pain Assessment   Currently in Pain? No/denies             Capillary Blood Glucose: No results found for this or any previous visit (from the past 24 hour(s)).    Social History   Tobacco Use  Smoking Status Never  Smokeless Tobacco Never    Goals Met:  Proper associated with RPD/PD & O2 Sat Exercise tolerated well No report of concerns or symptoms today Strength training completed today  Goals Unmet:  Not Applicable  Comments: Service time is from [place] to [place]    Dr. Rodman Pickle is Medical Director for Pulmonary Rehab at [facility].

## 2022-09-06 ENCOUNTER — Encounter (HOSPITAL_COMMUNITY)
Admission: RE | Admit: 2022-09-06 | Discharge: 2022-09-06 | Disposition: A | Discharge status: Home / Self Care | Payer: PPO | Source: Ambulatory Visit | Attending: Pulmonary Disease | Admitting: Pulmonary Disease

## 2022-09-06 VITALS — Wt 125.4 lb

## 2022-09-06 DIAGNOSIS — R002 Palpitations: Secondary | ICD-10-CM | POA: Diagnosis not present

## 2022-09-06 DIAGNOSIS — J849 Interstitial pulmonary disease, unspecified: Secondary | ICD-10-CM

## 2022-09-06 NOTE — Progress Notes (Signed)
Daily Session Note  Patient Details  Name: Susan Bray MRN: [id number] Date of Birth: Mar 14, 1946 Referring Provider:   [name] Pulmonary Rehab Walk Test from 07/27/2022 in [facility]  Referring Provider [name]       Encounter Date: 09/06/2022  Check In:  Session Check In - 09/06/22 1127       Check-In   Supervising physician immediately available to respond to emergencies [facility] - Physician supervision    Physician(s) Dr.Desai    Location [facility]    Staff Present Josephina Shih BS, ACSM-CEP, Exercise Physiologist;Jetta Gilford Rile BS, ACSM-CEP, Exercise Physiologist;Kaylee Rosana Hoes, MS, ACSM-CEP, Exercise Physiologist;Lisa Ysidro Evert, RN    Virtual Visit No    Medication changes reported     No    Fall or balance concerns reported    No    Tobacco Cessation No Change    Warm-up and Cool-down Performed as group-led instruction    Resistance Training Performed Yes    VAD Patient? No      Pain Assessment   Currently in Pain? No/denies    Multiple Pain Sites No             Capillary Blood Glucose: No results found for this or any previous visit (from the past 24 hour(s)).    Social History   Tobacco Use  Smoking Status Never  Smokeless Tobacco Never    Goals Met:  Independence with exercise equipment Exercise tolerated well No report of concerns or symptoms today Strength training completed today  Goals Unmet:  Not Applicable  Comments: Service time is from 1005 to 1143.    Dr. Rodman Pickle is Medical Director for Pulmonary Rehab at [facility].

## 2022-09-10 ENCOUNTER — Ambulatory Visit (HOSPITAL_COMMUNITY): Payer: PPO | Attending: Cardiology

## 2022-09-10 DIAGNOSIS — I4891 Unspecified atrial fibrillation: Secondary | ICD-10-CM | POA: Diagnosis not present

## 2022-09-10 LAB — ECHOCARDIOGRAM COMPLETE
Area-P 1/2: 3.19 cm2
S' Lateral: 3 cm

## 2022-09-11 ENCOUNTER — Encounter (HOSPITAL_COMMUNITY): Payer: PPO

## 2022-09-11 ENCOUNTER — Encounter: Payer: Self-pay | Admitting: Pulmonary Disease

## 2022-09-11 ENCOUNTER — Ambulatory Visit: Payer: PPO | Admitting: Gastroenterology

## 2022-09-12 NOTE — Progress Notes (Signed)
Pulmonary Individual Treatment Plan  Patient Details  Name: Susan Bray MRN: [id number] Date of Birth: 1945-11-29 Referring Provider:   [name] Pulmonary Rehab Walk Test from 07/27/2022 in [facility] [place]  Referring Provider [name]       Initial Encounter Date:  Flowsheet Row Pulmonary Rehab Walk Test from 07/27/2022 in [facility], & [place]  Date 07/27/22       Visit Diagnosis: ILD (interstitial lung disease) ([place])  Palpitations  Patient's Home Medications on Admission:   Current Outpatient Medications:    apixaban (ELIQUIS) 5 MG TABS tablet, Take 1 tablet (5 mg total) by mouth 2 (two) times daily., Disp: 180 tablet, Rfl: 1   azaTHIOprine (IMURAN) 50 MG tablet, Take 3 tablets (150 mg total) by mouth daily., Disp: 90 tablet, Rfl: 5   clobetasol ointment (TEMOVATE) 0.05 %, Apply topically 2 (two) times daily., Disp: , Rfl:    diltiazem (CARDIZEM CD) 120 MG 24 hr capsule, Take 1 capsule (120 mg total) by mouth daily., Disp: 90 capsule, Rfl: 3   ezetimibe (ZETIA) 10 MG tablet, Take 10 mg by mouth daily., Disp: , Rfl:    fenofibrate micronized (LOFIBRA) 134 MG capsule, Take 134 mg by mouth daily before breakfast., Disp: , Rfl:    predniSONE (DELTASONE) 10 MG tablet, Take 5 mg by mouth daily., Disp: , Rfl:    SYNTHROID 50 MCG tablet, Take by mouth., Disp: , Rfl:   Past Medical History: Past Medical History:  Diagnosis Date   CAD (coronary artery disease)    Cataract    Hyperlipidemia    IBS (irritable bowel syndrome)    VIN III (vulvar intraepithelial neoplasia III)     Tobacco Use: Social History   Tobacco Use  Smoking Status Never  Smokeless Tobacco Never    Labs: Review Flowsheet       Latest Ref Rng & Units 09/08/2014 11/15/2015  Labs for ITP Cardiac and Pulmonary Rehab  Cholestrol 125 - 200 mg/dL 180  149   LDL (calc) <130 mg/dL 113  79   HDL-C >=46  mg/dL 46.20  47   Trlycerides <150 mg/dL 106.0  117     Capillary Blood Glucose: No results found for: "GLUCAP"   Pulmonary Assessment Scores:  Pulmonary Assessment Scores     Row Name 07/27/22 1102         ADL UCSD   ADL Phase Entry     SOB Score total 53       CAT Score   CAT Score 14       mMRC Score   mMRC Score 3             UCSD: Self-administered rating of dyspnea associated with activities of daily living (ADLs) 6-point scale (0 = "not at all" to 5 = "maximal or unable to do because of breathlessness")  Scoring Scores range from 0 to 120.  Minimally important difference is 5 units  CAT: CAT can identify the health impairment of COPD patients and is better correlated with disease progression.  CAT has a scoring range of zero to 40. The CAT score is classified into four groups of low (less than 10), medium (10 - 20), high (21-30) and very high (31-40) based on the impact level of disease on health status. A CAT score over 10 suggests significant symptoms.  A worsening CAT score could be explained by an exacerbation, poor medication adherence,  poor inhaler technique, or progression of COPD or comorbid conditions.  CAT MCID is 2 points  mMRC: mMRC (Modified Medical Research Council) Dyspnea Scale is used to assess the degree of baseline functional disability in patients of respiratory disease due to dyspnea. No minimal important difference is established. A decrease in score of 1 point or greater is considered a positive change.   Pulmonary Function Assessment:  Pulmonary Function Assessment - 07/27/22 1102       Breath   Bilateral Breath Sounds Clear    Shortness of Breath Yes;Fear of Shortness of Breath;Limiting activity             Exercise Target Goals: Exercise Program Goal: Individual exercise prescription set using results from initial 6 min walk test and THRR while considering  patient's activity barriers and safety.   Exercise Prescription  Goal: Initial exercise prescription builds to 30-45 minutes a day of aerobic activity, 2-3 days per week.  Home exercise guidelines will be given to patient during program as part of exercise prescription that the participant will acknowledge.  Activity Barriers & Risk Stratification:  Activity Barriers & Cardiac Risk Stratification - 07/27/22 0925       Activity Barriers & Cardiac Risk Stratification   Activity Barriers Deconditioning;Muscular Weakness;Shortness of Breath;History of Falls    Cardiac Risk Stratification Moderate             6 Minute Walk:  6 Minute Walk     Row Name 07/27/22 1105         6 Minute Walk   Phase Initial     Distance 1005 feet     Walk Time 6 minutes     # of Rest Breaks 0     MPH 3.07     METS 3.35     RPE 13     Perceived Dyspnea  3     VO2 Peak 11.71     Symptoms No     Resting HR 69 bpm     Resting BP 120/60     Resting Oxygen Saturation  97 %     Exercise Oxygen Saturation  during 6 min walk 93 %     Max Ex. HR 80 bpm     Max Ex. BP 138/60     2 Minute Post BP 124/60       Interval HR   1 Minute HR 77     2 Minute HR 80     3 Minute HR 79     4 Minute HR 76     5 Minute HR 80     6 Minute HR 78     2 Minute Post HR 64     Interval Heart Rate? Yes       Interval Oxygen   Interval Oxygen? Yes     Baseline Oxygen Saturation % 97 %     1 Minute Oxygen Saturation % 97 %     1 Minute Liters of Oxygen 0 L     2 Minute Oxygen Saturation % 99 %     2 Minute Liters of Oxygen 0 L     3 Minute Oxygen Saturation % 93 %     3 Minute Liters of Oxygen 0 L     4 Minute Oxygen Saturation % 98 %     4 Minute Liters of Oxygen 0 L     5 Minute Oxygen Saturation % 96 %     5 Minute Liters of Oxygen  0 L     6 Minute Oxygen Saturation % 97 %     6 Minute Liters of Oxygen 0 L     2 Minute Post Oxygen Saturation % 100 %     2 Minute Post Liters of Oxygen 64 L              Oxygen Initial Assessment:  Oxygen Initial Assessment -  07/27/22 0921       Home Oxygen   Home Oxygen Device None    Sleep Oxygen Prescription None    Home Exercise Oxygen Prescription None    Home Resting Oxygen Prescription None      Initial 6 min Walk   Oxygen Used None      Program Oxygen Prescription   Program Oxygen Prescription None      Intervention   Short Term Goals To learn and exhibit compliance with exercise, home and travel O2 prescription;To learn and understand importance of monitoring SPO2 with pulse oximeter and demonstrate accurate use of the pulse oximeter.;To learn and understand importance of maintaining oxygen saturations>88%;To learn and demonstrate proper pursed lip breathing techniques or other breathing techniques. ;To learn and demonstrate proper use of respiratory medications    Long  Term Goals Exhibits compliance with exercise, home  and travel O2 prescription;Verbalizes importance of monitoring SPO2 with pulse oximeter and return demonstration;Maintenance of O2 saturations>88%;Exhibits proper breathing techniques, such as pursed lip breathing or other method taught during program session;Compliance with respiratory medication;Demonstrates proper use of MDI's             Oxygen Re-Evaluation:  Oxygen Re-Evaluation     Row Name 08/06/22 1201 09/05/22 0725           Program Oxygen Prescription   Program Oxygen Prescription None None        Home Oxygen   Home Oxygen Device None None      Sleep Oxygen Prescription None None      Home Exercise Oxygen Prescription None None      Home Resting Oxygen Prescription None None        Goals/Expected Outcomes   Short Term Goals To learn and exhibit compliance with exercise, home and travel O2 prescription;To learn and understand importance of monitoring SPO2 with pulse oximeter and demonstrate accurate use of the pulse oximeter.;To learn and understand importance of maintaining oxygen saturations>88%;To learn and demonstrate proper pursed lip breathing  techniques or other breathing techniques. ;To learn and demonstrate proper use of respiratory medications To learn and exhibit compliance with exercise, home and travel O2 prescription;To learn and understand importance of monitoring SPO2 with pulse oximeter and demonstrate accurate use of the pulse oximeter.;To learn and understand importance of maintaining oxygen saturations>88%;To learn and demonstrate proper pursed lip breathing techniques or other breathing techniques. ;To learn and demonstrate proper use of respiratory medications      Long  Term Goals Exhibits compliance with exercise, home  and travel O2 prescription;Verbalizes importance of monitoring SPO2 with pulse oximeter and return demonstration;Maintenance of O2 saturations>88%;Exhibits proper breathing techniques, such as pursed lip breathing or other method taught during program session;Compliance with respiratory medication;Demonstrates proper use of MDI's Exhibits compliance with exercise, home  and travel O2 prescription;Verbalizes importance of monitoring SPO2 with pulse oximeter and return demonstration;Maintenance of O2 saturations>88%;Exhibits proper breathing techniques, such as pursed lip breathing or other method taught during program session;Compliance with respiratory medication;Demonstrates proper use of MDI's      Goals/Expected Outcomes Compliance and understanding of oxygen saturation monitoring  and breathing techniques to decrease shortness of breath. Compliance and understanding of oxygen saturation monitoring and breathing techniques to decrease shortness of breath.               Oxygen Discharge (Final Oxygen Re-Evaluation):  Oxygen Re-Evaluation - 09/05/22 0725       Program Oxygen Prescription   Program Oxygen Prescription None      Home Oxygen   Home Oxygen Device None    Sleep Oxygen Prescription None    Home Exercise Oxygen Prescription None    Home Resting Oxygen Prescription None      Goals/Expected  Outcomes   Short Term Goals To learn and exhibit compliance with exercise, home and travel O2 prescription;To learn and understand importance of monitoring SPO2 with pulse oximeter and demonstrate accurate use of the pulse oximeter.;To learn and understand importance of maintaining oxygen saturations>88%;To learn and demonstrate proper pursed lip breathing techniques or other breathing techniques. ;To learn and demonstrate proper use of respiratory medications    Long  Term Goals Exhibits compliance with exercise, home  and travel O2 prescription;Verbalizes importance of monitoring SPO2 with pulse oximeter and return demonstration;Maintenance of O2 saturations>88%;Exhibits proper breathing techniques, such as pursed lip breathing or other method taught during program session;Compliance with respiratory medication;Demonstrates proper use of MDI's    Goals/Expected Outcomes Compliance and understanding of oxygen saturation monitoring and breathing techniques to decrease shortness of breath.             Initial Exercise Prescription:  Initial Exercise Prescription - 07/27/22 1100       Date of Initial Exercise RX and Referring Provider   Date 07/27/22    Referring Provider [name]    Expected Discharge Date 10/04/22      Recumbant Bike   Level 1    Minutes 15    METs 2      NuStep   Level 1    SPM 60    Minutes 15    METs 1.8      Prescription Details   Frequency (times per week) 2    Duration Progress to 30 minutes of continuous aerobic without signs/symptoms of physical distress      Intensity   THRR 40-80% of Max Heartrate 58-117    Ratings of Perceived Exertion 11-13    Perceived Dyspnea 0-4      Progression   Progression Continue progressive overload as per policy without signs/symptoms or physical distress.      Resistance Training   Training Prescription Yes    Weight red bands    Reps 10-15             Perform Capillary Blood Glucose checks as  needed.  Exercise Prescription Changes:   Exercise Prescription Changes     Row Name 08/07/22 1200 08/16/22 1200 08/28/22 1200 09/06/22 1637       Response to Exercise   Blood Pressure (Admit) 118/62 114/54 114/62 96/40    Blood Pressure (Exercise) 130/60 132/60 110/58 --    Blood Pressure (Exit) 106/60 104/62 120/66 108/54    Heart Rate (Admit) 60 bpm 64 bpm 95 bpm 59 bpm    Heart Rate (Exercise) 75 bpm 81 bpm 73 bpm 104 bpm    Heart Rate (Exit) 59 bpm 64 bpm 63 bpm 61 bpm    Oxygen Saturation (Admit) 99 % 100 % 98 % 98 %    Oxygen Saturation (Exercise) 97 % 100 % 96 % 97 %    Oxygen Saturation (Exit)  98 % 99 % 97 % 97 %    Rating of Perceived Exertion (Exercise) _0 Perceived Dyspnea (Exercise) _1 Duration Progress to 30 minutes of  aerobic without signs/symptoms of physical distress Continue with 30 min of aerobic exercise without signs/symptoms of physical distress. Continue with 30 min of aerobic exercise without signs/symptoms of physical distress. Continue with 30 min of aerobic exercise without signs/symptoms of physical distress.    Intensity Other (comment)  40-80% of HRR THRR unchanged THRR unchanged THRR unchanged      Progression   Progression Continue to progress workloads to maintain intensity without signs/symptoms of physical distress. Continue to progress workloads to maintain intensity without signs/symptoms of physical distress. Continue to progress workloads to maintain intensity without signs/symptoms of physical distress. Continue to progress workloads to maintain intensity without signs/symptoms of physical distress.      Resistance Training   Training Prescription Yes Yes Yes Yes    Weight Red bands Red bands Red bands Red bands    Reps 10-15 10-15 10-15 10-15    Time 10 Minutes 10 Minutes 10 Minutes 10 Minutes      Interval Training   Interval Training -- No -- --      Recumbant Bike   Level _2 Minutes _3 METs 1.8  2 3.3 3.7      NuStep   Level _4 SPM 70 70 70 70    Minutes _5 METs 2 2.2 2.4 2.2             Exercise Comments:   Exercise Comments     Row Name 08/02/22 1217           Exercise Comments Pt completed her first day of exercise. She exercised on the recumbent bike and Nustep for 15 min. Shakerra averaged 1.9 METs at level 1 on the recumbent bike and 2.1 METs at level 1 on the Nustep. She performed the warmup and cooldown standing without limitations.                Exercise Goals and Review:   Exercise Goals     Row Name 07/27/22 2952 08/06/22 1158 09/05/22 0723         Exercise Goals   Increase Physical Activity Yes Yes Yes     Intervention Provide advice, education, support and counseling about physical activity/exercise needs.;Develop an individualized exercise prescription for aerobic and resistive training based on initial evaluation findings, risk stratification, comorbidities and participant's personal goals. Provide advice, education, support and counseling about physical activity/exercise needs.;Develop an individualized exercise prescription for aerobic and resistive training based on initial evaluation findings, risk stratification, comorbidities and participant's personal goals. Provide advice, education, support and counseling about physical activity/exercise needs.;Develop an individualized exercise prescription for aerobic and resistive training based on initial evaluation findings, risk stratification, comorbidities and participant's personal goals.     Expected Outcomes Short Term: Attend rehab on a regular basis to increase amount of physical activity.;Long Term: Add in home exercise to make exercise part of routine and to increase amount of physical activity.;Long Term: Exercising regularly at least 3-5 days a week. Short Term: Attend rehab on a regular basis to increase amount of physical activity.;Long Term: Add in home exercise to make  exercise part of routine and to increase amount of physical  activity.;Long Term: Exercising regularly at least 3-5 days a week. Short Term: Attend rehab on a regular basis to increase amount of physical activity.;Long Term: Add in home exercise to make exercise part of routine and to increase amount of physical activity.;Long Term: Exercising regularly at least 3-5 days a week.     Increase Strength and Stamina Yes Yes Yes     Intervention Provide advice, education, support and counseling about physical activity/exercise needs.;Develop an individualized exercise prescription for aerobic and resistive training based on initial evaluation findings, risk stratification, comorbidities and participant's personal goals. Provide advice, education, support and counseling about physical activity/exercise needs.;Develop an individualized exercise prescription for aerobic and resistive training based on initial evaluation findings, risk stratification, comorbidities and participant's personal goals. Provide advice, education, support and counseling about physical activity/exercise needs.;Develop an individualized exercise prescription for aerobic and resistive training based on initial evaluation findings, risk stratification, comorbidities and participant's personal goals.     Expected Outcomes Short Term: Increase workloads from initial exercise prescription for resistance, speed, and METs.;Short Term: Perform resistance training exercises routinely during rehab and add in resistance training at home;Long Term: Improve cardiorespiratory fitness, muscular endurance and strength as measured by increased METs and functional capacity (6MWT) Short Term: Increase workloads from initial exercise prescription for resistance, speed, and METs.;Short Term: Perform resistance training exercises routinely during rehab and add in resistance training at home;Long Term: Improve cardiorespiratory fitness, muscular endurance and strength  as measured by increased METs and functional capacity (6MWT) Short Term: Increase workloads from initial exercise prescription for resistance, speed, and METs.;Short Term: Perform resistance training exercises routinely during rehab and add in resistance training at home;Long Term: Improve cardiorespiratory fitness, muscular endurance and strength as measured by increased METs and functional capacity (6MWT)     Able to understand and use rate of perceived exertion (RPE) scale Yes Yes Yes     Intervention Provide education and explanation on how to use RPE scale Provide education and explanation on how to use RPE scale Provide education and explanation on how to use RPE scale     Expected Outcomes Short Term: Able to use RPE daily in rehab to express subjective intensity level;Long Term:  Able to use RPE to guide intensity level when exercising independently Short Term: Able to use RPE daily in rehab to express subjective intensity level;Long Term:  Able to use RPE to guide intensity level when exercising independently Short Term: Able to use RPE daily in rehab to express subjective intensity level;Long Term:  Able to use RPE to guide intensity level when exercising independently     Able to understand and use Dyspnea scale Yes Yes Yes     Intervention Provide education and explanation on how to use Dyspnea scale Provide education and explanation on how to use Dyspnea scale Provide education and explanation on how to use Dyspnea scale     Expected Outcomes Short Term: Able to use Dyspnea scale daily in rehab to express subjective sense of shortness of breath during exertion;Long Term: Able to use Dyspnea scale to guide intensity level when exercising independently Short Term: Able to use Dyspnea scale daily in rehab to express subjective sense of shortness of breath during exertion;Long Term: Able to use Dyspnea scale to guide intensity level when exercising independently Short Term: Able to use Dyspnea scale  daily in rehab to express subjective sense of shortness of breath during exertion;Long Term: Able to use Dyspnea scale to guide intensity level when exercising independently  Knowledge and understanding of Target Heart Rate Range (THRR) Yes Yes Yes     Intervention Provide education and explanation of THRR including how the numbers were predicted and where they are located for reference Provide education and explanation of THRR including how the numbers were predicted and where they are located for reference Provide education and explanation of THRR including how the numbers were predicted and where they are located for reference     Expected Outcomes Short Term: Able to state/look up THRR;Long Term: Able to use THRR to govern intensity when exercising independently;Short Term: Able to use daily as guideline for intensity in rehab Short Term: Able to state/look up THRR;Long Term: Able to use THRR to govern intensity when exercising independently;Short Term: Able to use daily as guideline for intensity in rehab Short Term: Able to state/look up THRR;Long Term: Able to use THRR to govern intensity when exercising independently;Short Term: Able to use daily as guideline for intensity in rehab     Understanding of Exercise Prescription Yes Yes Yes     Intervention Provide education, explanation, and written materials on patient's individual exercise prescription Provide education, explanation, and written materials on patient's individual exercise prescription Provide education, explanation, and written materials on patient's individual exercise prescription     Expected Outcomes Short Term: Able to explain program exercise prescription;Long Term: Able to explain home exercise prescription to exercise independently Short Term: Able to explain program exercise prescription;Long Term: Able to explain home exercise prescription to exercise independently Short Term: Able to explain program exercise  prescription;Long Term: Able to explain home exercise prescription to exercise independently              Exercise Goals Re-Evaluation :  Exercise Goals Re-Evaluation     Row Name 08/06/22 1158 09/05/22 0723           Exercise Goal Re-Evaluation   Exercise Goals Review Increase Physical Activity;Increase Strength and Stamina;Able to understand and use rate of perceived exertion (RPE) scale;Able to understand and use Dyspnea scale;Knowledge and understanding of Target Heart Rate Range (THRR);Understanding of Exercise Prescription Increase Physical Activity;Increase Strength and Stamina;Able to understand and use rate of perceived exertion (RPE) scale;Able to understand and use Dyspnea scale;Knowledge and understanding of Target Heart Rate Range (THRR);Understanding of Exercise Prescription      Comments [place] has completed 1 exercise session. She exercises on the recumbent bike and Nustep for 15 min. She averages 1.9 METs at level 1 on the recumbent bike and 2.1 METs at level 1 on the Nustep. She performs the warmup and cooldown standing without limitations. It is too soon to note any discernable progressions. Will continue to monitor and progress as able. Hazle has completed 9 exercise sessions. She exercises on the recumbent bike and Nustep for 15 min. She averages 3.8 METs at level 1 on the recumbent bike and 2.2 METs at level 4 on the Nustep. She performs the warmup and cooldown standing without limitations. Nerida has increased her workload for exercise modes. She has tolerated progressions well as METs have increased. Kyann seems motivated to exercise and improve her functional capacity. Will continue to monitor and progress as able.      Expected Outcomes Through exercise at rehab and home, the patient will decrease shortness of breath with daily activities and feel confident in carrying out an exercise regimen at home. Through exercise at rehab and home, the patient will decrease shortness  of breath with daily activities and feel confident in carrying out an  exercise regimen at home.               Discharge Exercise Prescription (Final Exercise Prescription Changes):  Exercise Prescription Changes - 09/06/22 1637       Response to Exercise   Blood Pressure (Admit) 96/40    Blood Pressure (Exit) 108/54    Heart Rate (Admit) 59 bpm    Heart Rate (Exercise) 104 bpm    Heart Rate (Exit) 61 bpm    Oxygen Saturation (Admit) 98 %    Oxygen Saturation (Exercise) 97 %    Oxygen Saturation (Exit) 97 %    Rating of Perceived Exertion (Exercise) 10    Perceived Dyspnea (Exercise) 1    Duration Continue with 30 min of aerobic exercise without signs/symptoms of physical distress.    Intensity THRR unchanged      Progression   Progression Continue to progress workloads to maintain intensity without signs/symptoms of physical distress.      Resistance Training   Training Prescription Yes    Weight Red bands    Reps 10-15    Time 10 Minutes      Recumbant Bike   Level 3    Minutes 15    METs 3.7      NuStep   Level 4    SPM 70    Minutes 15    METs 2.2             Nutrition:  Target Goals: Understanding of nutrition guidelines, daily intake of sodium <1565m, cholesterol <2049m calories 30% from fat and 7% or less from saturated fats, daily to have 5 or more servings of fruits and vegetables.  Biometrics:  Pre Biometrics - 07/27/22 0910       Pre Biometrics   Grip Strength 19 kg              Nutrition Therapy Plan and Nutrition Goals:  Nutrition Therapy & Goals - 08/28/22 1138       Nutrition Therapy   Diet Heart Healthy diet      Personal Nutrition Goals   Nutrition Goal Patient to identify strategies for weight maintanence/weight gain of 0.5-2.0# per week.    Personal Goal #2 Patient to reduce sodium intake to <20005mer day    Personal Goal #3 Patient to identify food sources and limit daily intake of saturated fat, trans fat, sodium,  and refined carbohydrates    Comments Goals in progress. WanKayelynnntinues 1 nutrition supplement daily to aid with unintentional weight loss with ongoing reports of gas and bloating. She cancelled her appointment with GI for this month; stressed the importanc of re-scheduling appointment with GI.  She continues follow-up with cardiology for A-fib, dyslipidemia, CAD; she does not tolerate statins and declines PCSK9 intervention per most recent follow-up on 11/2.      Intervention Plan   Intervention Prescribe, educate and counsel regarding individualized specific dietary modifications aiming towards targeted core components such as weight, hypertension, lipid management, diabetes, heart failure and other comorbidities.;Nutrition handout(s) given to patient.    Expected Outcomes Short Term Goal: Understand basic principles of dietary content, such as calories, fat, sodium, cholesterol and nutrients.;Short Term Goal: A plan has been developed with personal nutrition goals set during dietitian appointment.;Long Term Goal: Adherence to prescribed nutrition plan.             Nutrition Assessments:  Nutrition Assessments - 08/02/22 1150       Rate Your Plate Scores   Pre Score 57  MEDIFICTS Score Key: ?70 Need to make dietary changes  40-70 Heart Healthy Diet ? 40 Therapeutic Level Cholesterol Diet  Flowsheet Row PULMONARY REHAB OTHER RESPIRATORY from 08/02/2022 in [facility], V[facility], & Lung [facility]  Picture Your Plate Total Score on Admission 57      Picture Your Plate Scores: <65 Unhealthy dietary pattern with much room for improvement. 41-50 Dietary pattern unlikely to meet recommendations for good health and room for improvement. 51-60 More healthful dietary pattern, with some room for improvement.  >60 Healthy dietary pattern, although there may be some specific behaviors that could be improved.    Nutrition Goals Re-Evaluation:   Nutrition Goals Re-Evaluation     [place] Name 08/02/22 1132 08/28/22 1138           Goals   Current Weight 124 lb 5.4 oz (56.4 kg) 125 lb 7.1 oz (56.9 kg)      Comment LDL 143, triglycerides 161, cholesterol 221 No new labs; most recent labs show LDL 143, triglycerides 161, cholesterol 221      Expected Outcome Rhyleigh reports concerns of GI discomfort including gas and bloating over the last month; she does have appointment with Gastroenterology in November. She also has history of IBS, GERD. She is down ~10# of unintentional weight loss since 09/21/2021 at 134#. In addition, per cardiology notes, she reports not tolerating statin medications and declines PCSK9 intervention. We discussed strategies for weight maintenance and weight gain; she is amicable to introducing 1 nutrition supplements such as Boost or Ensure daily. She reports "grazing"/snacking behaviors throughout the day and often eats out for dinner. Goals in progress. Ernesha continues 1 nutrition supplement daily (220-250kcals, 9-16g protein)  to aid with unintentional weight loss with ongoing reports of gas and bloating. She is up 2.6# since starting with our program. She cancelled her appointment with GI for this month; stressed the importanc of re-scheduling appointment with GI. She continues follow-up with cardiology for A-fib, dyslipidemia, CAD; she does not tolerate statins and declines PCSK9 intervention per most recent follow-up on 11/2.               Nutrition Goals Discharge (Final Nutrition Goals Re-Evaluation):  Nutrition Goals Re-Evaluation - 08/28/22 1138       Goals   Current Weight 125 lb 7.1 oz (56.9 kg)    Comment No new labs; most recent labs show LDL 143, triglycerides 161, cholesterol 221    Expected Outcome Goals in progress. Arta continues 1 nutrition supplement daily (220-250kcals, 9-16g protein)  to aid with unintentional weight loss with ongoing reports of gas and bloating. She is up 2.6# since starting with  our program. She cancelled her appointment with GI for this month; stressed the importanc of re-scheduling appointment with GI. She continues follow-up with cardiology for A-fib, dyslipidemia, CAD; she does not tolerate statins and declines PCSK9 intervention per most recent follow-up on 11/2.             Psychosocial: Target Goals: Acknowledge presence or absence of significant depression and/or stress, maximize coping skills, provide positive support system. Participant is able to verbalize types and ability to use techniques and skills needed for reducing stress and depression.  Initial Review & Psychosocial Screening:  Initial Psych Review & Screening - 07/27/22 0920       Initial Review   Current issues with None Identified    Comments Lives w/ son. Does not report psychosocial barriers to exercise.      Family  Dynamics   Good Support System? Yes    Comments son      Barriers   Psychosocial barriers to participate in program There are no identifiable barriers or psychosocial needs.      Screening Interventions   Interventions Encouraged to exercise             Quality of Life Scores:  Scores of 19 and below usually indicate a poorer quality of life in these areas.  A difference of  2-3 points is a clinically meaningful difference.  A difference of 2-3 points in the total score of the Quality of Life Index has been associated with significant improvement in overall quality of life, self-image, physical symptoms, and general health in studies assessing change in quality of life.  PHQ-9: Review Flowsheet       07/27/2022  Depression screen PHQ 2/9  Decreased Interest 0  Down, Depressed, Hopeless 0  PHQ - 2 Score 0  Altered sleeping 0  Tired, decreased energy 0  Change in appetite 0  Feeling bad or failure about yourself  0  Trouble concentrating 0  Moving slowly or fidgety/restless 0  Suicidal thoughts 0  PHQ-9 Score 0  Difficult doing work/chores Not  difficult at all   Interpretation of Total Score  Total Score Depression Severity:  1-4 = Minimal depression, 5-9 = Mild depression, 10-14 = Moderate depression, 15-19 = Moderately severe depression, 20-27 = Severe depression   Psychosocial Evaluation and Intervention:  Psychosocial Evaluation - 07/27/22 0923       Psychosocial Evaluation & Interventions   Interventions Encouraged to exercise with the program and follow exercise prescription    Comments Yasaman does not report any psychosocial concerns.    Expected Outcomes For Modine to participate in Pulmonary Rehab free of psychosocial concerns.    Continue Psychosocial Services  No Follow up required             Psychosocial Re-Evaluation:  Psychosocial Re-Evaluation     [place] Name 08/10/22 8921 09/05/22 0725           Psychosocial Re-Evaluation   Current issues with None Identified None Identified      Comments Tanishi has been participating in rehab free of psychosocial concerns. She feels that the program has helped improved her conditioning level. Ileah is still participating in rehab free of psychosocial concerns. She enjoys the group exercise setting as she converses with other patients.      Expected Outcomes For Ivana to participate in rehab free of psychosocial concerns. For Khamia to participate in rehab free of psychosocial concerns.      Interventions Encouraged to attend Pulmonary Rehabilitation for the exercise Encouraged to attend Pulmonary Rehabilitation for the exercise      Continue Psychosocial Services  No Follow up required No Follow up required               Psychosocial Discharge (Final Psychosocial Re-Evaluation):  Psychosocial Re-Evaluation - 09/05/22 0725       Psychosocial Re-Evaluation   Current issues with None Identified    Comments Katiria is still participating in rehab free of psychosocial concerns. She enjoys the group exercise setting as she converses with other patients.    Expected  Outcomes For Jasnoor to participate in rehab free of psychosocial concerns.    Interventions Encouraged to attend Pulmonary Rehabilitation for the exercise    Continue Psychosocial Services  No Follow up required             Education:  Education Goals: Education classes will be provided on a weekly basis, covering required topics. Participant will state understanding/return demonstration of topics presented.  Learning Barriers/Preferences:  Learning Barriers/Preferences - 07/27/22 0924       Learning Barriers/Preferences   Learning Barriers Sight   Bilat eye surgery   Learning Preferences None             Education Topics: Introduction to Pulmonary Rehab Group instruction provided by PowerPoint, verbal discussion, and written material to support subject matter. Instructor reviews what Pulmonary Rehab is, the purpose of the program, and how patients are referred.     Know Your Numbers Group instruction that is supported by a PowerPoint presentation. Instructor discusses importance of knowing and understanding resting, exercise, and post-exercise oxygen saturation, heart rate, and blood pressure. Oxygen saturation, heart rate, blood pressure, rating of perceived exertion, and dyspnea are reviewed along with a normal range for these values.  Flowsheet Row PULMONARY REHAB OTHER RESPIRATORY from 09/06/2022 in [facility] for Heart, Vascular, & Lung Health  Date 08/09/22  Educator EP  Instruction Review Code 1- Verbalizes Understanding       Exercise for the Pulmonary Patient Group instruction that is supported by a PowerPoint presentation. Instructor discusses benefits of exercise, core components of exercise, frequency, duration, and intensity of an exercise routine, importance of utilizing pulse oximetry during exercise, safety while exercising, and options of places to exercise outside of rehab.       MET Level  Group instruction provided by  PowerPoint, verbal discussion, and written material to support subject matter. Instructor reviews what METs are and how to increase METs.    Pulmonary Medications Verbally interactive group education provided by instructor with focus on inhaled medications and proper administration.   Anatomy and Physiology of the Respiratory System Group instruction provided by PowerPoint, verbal discussion, and written material to support subject matter. Instructor reviews respiratory cycle and anatomical components of the respiratory system and their functions. Instructor also reviews differences in obstructive and restrictive respiratory diseases with examples of each.  Flowsheet Row PULMONARY REHAB OTHER RESPIRATORY from 09/06/2022 in [facility] for Heart, Vascular, & Lung Health  Date 08/02/22  Educator EP  Instruction Review Code 1- Verbalizes Understanding       Oxygen Safety Group instruction provided by PowerPoint, verbal discussion, and written material to support subject matter. There is an overview of "What is Oxygen" and "Why do we need it".  Instructor also reviews how to create a safe environment for oxygen use, the importance of using oxygen as prescribed, and the risks of noncompliance. There is a brief discussion on traveling with oxygen and resources the patient may utilize. Flowsheet Row PULMONARY REHAB OTHER RESPIRATORY from 09/06/2022 in [facility] for Heart, Vascular, & Lung Health  Date 08/30/22  Educator EP  Instruction Review Code 1- Verbalizes Understanding       Oxygen Use Group instruction provided by PowerPoint, verbal discussion, and written material to discuss how supplemental oxygen is prescribed and different types of oxygen supply systems. Resources for more information are provided.  Flowsheet Row PULMONARY REHAB OTHER RESPIRATORY from 09/06/2022 in [facility] for Heart, Vascular, & Lung Health   Date 09/06/22  Educator Donnetta Simpers  [Handout]  Instruction Review Code 1- Verbalizes Understanding       Breathing Techniques Group instruction that is supported by demonstration and informational handouts. Instructor discusses the benefits of pursed lip and diaphragmatic breathing and  detailed demonstration on how to perform both.     Risk Factor Reduction Group instruction that is supported by a PowerPoint presentation. Instructor discusses the definition of a risk factor, different risk factors for pulmonary disease, and how the heart and lungs work together.   MD Day A group question and answer session with a medical doctor that allows participants to ask questions that relate to their pulmonary disease state.   Nutrition for the Pulmonary Patient Group instruction provided by PowerPoint slides, verbal discussion, and written materials to support subject matter. The instructor gives an explanation and review of healthy diet recommendations, which includes a discussion on weight management, recommendations for fruit and vegetable consumption, as well as protein, fluid, caffeine, fiber, sodium, sugar, and alcohol. Tips for eating when patients are short of breath are discussed.    Other Education Group or individual verbal, written, or video instructions that support the educational goals of the pulmonary rehab program.    Knowledge Questionnaire Score:  Knowledge Questionnaire Score - 07/27/22 1110       Knowledge Questionnaire Score   Pre Score 18/18             Core Components/Risk Factors/Patient Goals at Admission:  Personal Goals and Risk Factors at Admission - 07/27/22 0925       Core Components/Risk Factors/Patient Goals on Admission   Improve shortness of breath with ADL's Yes    Intervention Provide education, individualized exercise plan and daily activity instruction to help decrease symptoms of SOB with activities of daily living.    Expected Outcomes Short  Term: Improve cardiorespiratory fitness to achieve a reduction of symptoms when performing ADLs;Long Term: Be able to perform more ADLs without symptoms or delay the onset of symptoms    Increase knowledge of respiratory medications and ability to use respiratory devices properly  Yes    Intervention Provide education and demonstration as needed of appropriate use of medications, inhalers, and oxygen therapy.    Expected Outcomes Short Term: Achieves understanding of medications use. Understands that oxygen is a medication prescribed by physician. Demonstrates appropriate use of inhaler and oxygen therapy.;Long Term: Maintain appropriate use of medications, inhalers, and oxygen therapy.             Core Components/Risk Factors/Patient Goals Review:   Goals and Risk Factor Review     Row Name 08/10/22 0645 09/05/22 0726           Core Components/Risk Factors/Patient Goals Review   Personal Goals Review Improve shortness of breath with ADL's;Develop more efficient breathing techniques such as purse lipped breathing and diaphragmatic breathing and practicing self-pacing with activity.;Increase knowledge of respiratory medications and ability to use respiratory devices properly. Improve shortness of breath with ADL's;Develop more efficient breathing techniques such as purse lipped breathing and diaphragmatic breathing and practicing self-pacing with activity.;Increase knowledge of respiratory medications and ability to use respiratory devices properly.      Review Twila has completed 3 exercise sessions. She has increased her METs on the recumbent bike and Nustep. She feels that her conditioning level has improved since starting rehab. Giovanni has currently learned more about her disease. Will review medication later in the program. Will continue assess. Brent has completed 9 exercise sessions. She has increased her METs on the recumbent bike and Nustep. She feels that her conditioning level has  improved since starting rehab. Aariya was given resources to learn more about her pulmonary disease. She feels this benefitted her. Will continue to assess.  Expected Outcomes See admission goals. See admission goals.               Core Components/Risk Factors/Patient Goals at Discharge (Final Review):   Goals and Risk Factor Review - 09/05/22 0726       Core Components/Risk Factors/Patient Goals Review   Personal Goals Review Improve shortness of breath with ADL's;Develop more efficient breathing techniques such as purse lipped breathing and diaphragmatic breathing and practicing self-pacing with activity.;Increase knowledge of respiratory medications and ability to use respiratory devices properly.    Review Charlcie has completed 9 exercise sessions. She has increased her METs on the recumbent bike and Nustep. She feels that her conditioning level has improved since starting rehab. Rickiya was given resources to learn more about her pulmonary disease. She feels this benefitted her. Will continue to assess.    Expected Outcomes See admission goals.             ITP Comments:   Comments: Pt is making expected progress toward Pulmonary Rehab goals after completing 10 sessions. Recommend continued exercise, life style modification, education, and utilization of breathing techniques to increase stamina and strength, while also decreasing shortness of breath with exertion.  Dr. Rodman Pickle is Medical Director for Pulmonary Rehab at [facility].

## 2022-09-17 ENCOUNTER — Encounter: Payer: Self-pay | Admitting: Cardiology

## 2022-09-18 ENCOUNTER — Encounter (HOSPITAL_COMMUNITY)
Admission: RE | Admit: 2022-09-18 | Discharge: 2022-09-18 | Disposition: A | Discharge status: Home / Self Care | Payer: PPO | Source: Ambulatory Visit | Attending: Pulmonary Disease | Admitting: Pulmonary Disease

## 2022-09-18 DIAGNOSIS — R002 Palpitations: Secondary | ICD-10-CM | POA: Diagnosis not present

## 2022-09-18 DIAGNOSIS — J849 Interstitial pulmonary disease, unspecified: Secondary | ICD-10-CM

## 2022-09-18 NOTE — Progress Notes (Signed)
Home Exercise Prescription I have reviewed a Home Exercise Prescription with Rosalio Macadamia. anda is currently exercising at home. She walks 2-3 non-rehab days/wk for 10-15 min/day. I encouraged Kagan to increase her time to 30 min/day. I told her that she can split her time into 3x10 min or 2x15 min. I also recommended increased frequency when she gradautes the program. Mariann Laster agreed with my recommendations. Malayia is motivated to exercise and improve her functional capacity. The patient stated that their goals were to continue to maintain her current health status. We reviewed exercise guidelines, target heart rate during exercise, RPE Scale, weather conditions, endpoints for exercise, warmup and cool down. The patient is encouraged to come to me with any questions. I will continue to follow up with the patient to assist them with progression and safety.    Sheppard Plumber, MS, ACSM-CEP 09/18/2022 12:05 PM

## 2022-09-18 NOTE — Progress Notes (Signed)
Daily Session Note  Patient Details  Name: Susan Bray MRN: [id number] Date of Birth: 12/18/1945 Referring Provider:   [name] Pulmonary Rehab Walk Test from 07/27/2022 in [facility] for Heart, Vascular, & [place]  Referring Provider [name]       Encounter Date: 09/18/2022  Check In:  Session Check In - 09/18/22 1112       Check-In   Supervising physician immediately available to respond to emergencies [facility] - Physician supervision    Physician(s) [facility]    Location [facility]-Cardiac & Pulmonary Rehab    Staff Present [name], [name], MS, ACSM-CEP, Exercise Physiologist;Samantha [place], RD, LDN; Olen Cordial BS, ACSM-CEP, Exercise Physiologist;Other    Virtual Visit No    Medication changes reported     No    Fall or balance concerns reported    No    Tobacco Cessation No Change    Warm-up and Cool-down Performed as group-led instruction    Resistance Training Performed Yes    VAD Patient? No    PAD/SET Patient? No      Pain Assessment   Currently in Pain? No/denies             Capillary Blood Glucose: No results found for this or any previous visit (from the past 24 hour(s)).    Social History   Tobacco Use  Smoking Status Never  Smokeless Tobacco Never    Goals Met:  Independence with exercise equipment Exercise tolerated well No report of concerns or symptoms today Strength training completed today  Goals Unmet:  Not Applicable  Comments: Service time is from 1010 to 1142.    Dr. Rodman Pickle is Medical Director for Pulmonary Rehab at [facility].

## 2022-09-20 ENCOUNTER — Encounter (HOSPITAL_COMMUNITY)
Admission: RE | Admit: 2022-09-20 | Discharge: 2022-09-20 | Disposition: A | Discharge status: Home / Self Care | Payer: PPO | Source: Ambulatory Visit | Attending: Pulmonary Disease | Admitting: Pulmonary Disease

## 2022-09-20 DIAGNOSIS — J849 Interstitial pulmonary disease, unspecified: Secondary | ICD-10-CM

## 2022-09-20 DIAGNOSIS — R002 Palpitations: Secondary | ICD-10-CM | POA: Diagnosis not present

## 2022-09-20 NOTE — Progress Notes (Signed)
Daily Session Note  Patient Details  Name: Susan Bray MRN: [id number] Date of Birth: 1946-02-02 Referring Provider:   [name] Pulmonary Rehab Walk Test from 07/27/2022 in [facility] for Heart, Vascular, & [place]  Referring Provider [name]       Encounter Date: 09/20/2022  Check In:  Session Check In - 09/20/22 [place] immediately available to respond to emergencies [facility] - Physician supervision    Physician(s) Dr. Vaughan Browner    Location [facility]-Cardiac [facility] Rehab    Staff Present [name], [name], MS, ACSM-CEP, Exercise Physiologist;Randi Yevonne Pax, ACSM-CEP, Exercise Physiologist;Other    Virtual Visit No    Medication changes reported     No    Fall or balance concerns reported    No    Tobacco Cessation No Change    Warm-up and Cool-down Performed as group-led instruction    Resistance Training Performed Yes    VAD Patient? No    PAD/SET Patient? No      Pain Assessment   Currently in Pain? No/denies    Multiple Pain Sites No             Capillary Blood Glucose: No results found for this or any previous visit (from the past 24 hour(s)).    Social History   Tobacco Use  Smoking Status Never  Smokeless Tobacco Never    Goals Met:  Independence with exercise equipment Exercise tolerated well No report of concerns or symptoms today Strength training completed today  Goals Unmet:  Not Applicable  Comments: Service time is from 1009 to [place]    Dr. Rodman Pickle is Medical Director for Pulmonary Rehab at [facility].

## 2022-09-24 ENCOUNTER — Ambulatory Visit: Payer: PPO | Admitting: Pulmonary Disease

## 2022-09-24 NOTE — Telephone Encounter (Signed)
She is already on azathioprine therapy for interstitial lung disease.  Can we appeal?

## 2022-09-25 ENCOUNTER — Encounter (HOSPITAL_COMMUNITY)
Admission: RE | Admit: 2022-09-25 | Discharge: 2022-09-25 | Disposition: A | Discharge status: Home / Self Care | Payer: PPO | Source: Ambulatory Visit | Attending: Pulmonary Disease | Admitting: Pulmonary Disease

## 2022-09-25 VITALS — Wt 126.1 lb

## 2022-09-25 DIAGNOSIS — J849 Interstitial pulmonary disease, unspecified: Secondary | ICD-10-CM | POA: Insufficient documentation

## 2022-09-25 DIAGNOSIS — R002 Palpitations: Secondary | ICD-10-CM | POA: Insufficient documentation

## 2022-09-25 NOTE — Progress Notes (Signed)
Daily Session Note  Patient Details  Name: Susan Bray MRN: [id number] Date of Birth: 01-Feb-1946 Referring Provider:   [name] Pulmonary Rehab Walk Test from 07/27/2022 in [facility] for Heart, Vascular, & [place]  Referring Provider [name]       Encounter Date: 09/25/2022  Check In:  Session Check In - 09/25/22 1112       Check-In   Supervising physician immediately available to respond to emergencies [facility] - Physician supervision    Physician(s) Agarwala    Location [facility]    Staff Present Elmon Else, MS, ACSM-CEP, Exercise Physiologist;Samantha [place], RD, LDN;Randi Olen Cordial BS, ACSM-CEP, Exercise Physiologist;Other    Virtual Visit No    Medication changes reported     No    Fall or balance concerns reported    No    Tobacco Cessation No Change    Warm-up and Cool-down Performed as group-led instruction    Resistance Training Performed Yes    VAD Patient? No    PAD/SET Patient? No      Pain Assessment   Currently in Pain? No/denies             Capillary Blood Glucose: No results found for this or any previous visit (from the past 24 hour(s)).   Exercise Prescription Changes - 09/25/22 1200       Response to Exercise   Blood Pressure (Admit) 112/52    Blood Pressure (Exercise) 126/56    Blood Pressure (Exit) 110/60    Heart Rate (Admit) 62 bpm    Heart Rate (Exercise) 84 bpm    Heart Rate (Exit) 68 bpm    Oxygen Saturation (Admit) 98 %    Oxygen Saturation (Exercise) 98 %    Oxygen Saturation (Exit) 97 %    Rating of Perceived Exertion (Exercise) 13    Perceived Dyspnea (Exercise) 2    Duration Continue with 30 min of aerobic exercise without signs/symptoms of physical distress.    Intensity THRR unchanged      Progression   Progression Continue to progress workloads to maintain intensity without signs/symptoms of physical distress.      Resistance Training   Training Prescription Yes    Weight  Red bands    Reps 10-15    Time 10 Minutes      Recumbant Bike   Level 4    Minutes 15    METs 4.9      NuStep   Level 5    SPM 70    Minutes 15    METs 2.3             Social History   Tobacco Use  Smoking Status Never  Smokeless Tobacco Never    Goals Met:  Independence with exercise equipment Exercise tolerated well No report of concerns or symptoms today Strength training completed today  Goals Unmet:  Not Applicable  Comments: Service time is from 1008 to 1128    Dr. Rodman Pickle is Medical Director for Pulmonary Rehab at [facility].

## 2022-09-26 ENCOUNTER — Ambulatory Visit (INDEPENDENT_AMBULATORY_CARE_PROVIDER_SITE_OTHER): Payer: PPO | Admitting: Pulmonary Disease

## 2022-09-26 ENCOUNTER — Encounter: Payer: Self-pay | Admitting: Pulmonary Disease

## 2022-09-26 VITALS — BP 130/62 | HR 60 | Temp 98.0°F | Ht 63.0 in | Wt 124.4 lb

## 2022-09-26 DIAGNOSIS — Z5181 Encounter for therapeutic drug level monitoring: Secondary | ICD-10-CM | POA: Diagnosis not present

## 2022-09-26 DIAGNOSIS — J849 Interstitial pulmonary disease, unspecified: Secondary | ICD-10-CM

## 2022-09-26 NOTE — Patient Instructions (Signed)
I am glad you are stable with your breathing Continue the Imuran at 150 mg a day Will get labs and records from Dr. Kathlene November  Order follow-up high-res CT and PFTs in 4 months Return to clinic after these tests.

## 2022-09-26 NOTE — Progress Notes (Signed)
Susan Bray    [id number]    1946/10/08  Primary Care Physician:Hammer, Geryl Rankins, MD  Referring Physician: Collene Leyden, MD [address] [place],  [place] [address]  Chief complaint: Follow-up for interstitial lung disease  HPI: 76 y.o. who  has a past medical history of CAD (coronary artery disease), Cataract, Hyperlipidemia, IBS (irritable bowel syndrome), and VIN III (vulvar intraepithelial neoplasia III).   She has Sjogren's syndrome and dermatomyositis with initial complaints of skin thickening, joint stiffness, dyspnea and cough.  Diagnosis made in June 2023 by Dr. Kathlene November.  Initially tried on prednisone and methotrexate.  Methotrexate was stopped for unclear reason.  Continues on prednisone at 10 mg.  She was started on azathioprine 50 mg/day in July 2023 and this was increased to 100 mg/day in August 2023.  Her symptoms have improved with the treatment  Imaging shows interstitial lung disease and she has been referred here for further evaluation  Pets: No pets Occupation: Retired Optometrist Exposures: No mold, hot tub, Jacuzzi.  No feather pillows or comforters Smoking history: Never smoker Travel history: Significant travel history Relevant family history: No family history of lung disease  Interim history: Currently on azathioprine 150 mg a day.  Azathioprine has been denied by insurance and spite of multiple appeals.  She is paying out-of-pocket with good Rx card, costs $30 a month.  Prednisone has been reduced to '4mg'$  by Dr. Kathlene November Has started pulmonary rehab.   She had an episode of paroxysmal atrial fibrillation in November 2023 while in pulmonary rehab.  Has followed up with Dr. Percival Spanish from cardiology.  Started on Eliquis and Cardizem.   Outpatient Encounter Medications as of 09/26/2022  Medication Sig   apixaban (ELIQUIS) 5 MG TABS tablet Take 1 tablet (5 mg total) by mouth 2 (two) times daily.   azaTHIOprine (IMURAN) 50 MG tablet Take 3 tablets  (150 mg total) by mouth daily.   clobetasol ointment (TEMOVATE) 0.05 % Apply topically 2 (two) times daily.   diltiazem (CARDIZEM CD) 120 MG 24 hr capsule Take 1 capsule (120 mg total) by mouth daily.   ezetimibe (ZETIA) 10 MG tablet Take 10 mg by mouth daily.   fenofibrate micronized (LOFIBRA) 134 MG capsule Take 134 mg by mouth daily before breakfast.   predniSONE (DELTASONE) 10 MG tablet Take 5 mg by mouth daily.   SYNTHROID 50 MCG tablet Take by mouth.   No facility-administered encounter medications on file as of 09/26/2022.    Physical Exam: Blood pressure 130/62, pulse 60, temperature 98 F (36.7 C), temperature source Oral, height '5\' 3"'$  (1.6 m), weight 124 lb 6.4 oz (56.4 kg), SpO2 96 %. Gen:      No acute distress HEENT:  EOMI, sclera anicteric Neck:     No masses; no thyromegaly Lungs:    Clear to auscultation bilaterally; normal respiratory effort CV:         Regular rate and rhythm; no murmurs Abd:      + bowel sounds; soft, non-tender; no palpable masses, no distension Ext:    No edema; adequate peripheral perfusion Skin:      Warm and dry; no rash Neuro: alert and oriented x 3 Psych: normal mood and affect   Data Reviewed: Imaging: Cardiac CT 10/19/2015-visualized lung shows fibrotic changes with groundglass and traction bronchiectasis.  High-resolution CT 05/31/2022-basilar predominant fibrotic disease with groundglass opacities, traction bronchiectasis.  Alternate diagnosis suggestive of fibrotic NSIP I have reviewed the images  personally  PFTs: 06/10/2022 FVC 2.17 [79%], FEV1 1.94 [94%], F/F 89, TLC 3.04 [61%], DLCO 16.88 [91%] Moderate restriction  Labs: ILD panel 05/22/2022- ANA 1: 320, positive SSA, NX P2 antibody C-ANCA 05/22/2022- positive  Assessment:  Interstitial lung disease Likely related to her diagnosis of Sjogren's and dermatomyositis.  The pattern appears to be alternate suggestive of fibrotic NSIP.  This was noted as early as 2016 on a cardiac CT  and appears largely unchanged.  PFTs show moderate restriction  Currently on azathioprine at 150 a day She is getting slow prednisone taper.  Currently at 4 mg/day.  Prednisone taper is being managed by Dr. Kathlene November, rheumatology  She has a recent labs at the rheumatology office.  I will get these for our review  Health maintenance I have advised her to get flu, COVID and RSV vaccination but she has declined these vaccinations.  Plan/Recommendations: Continue azathioprine Check labs for monitoring Follow-up CT and PFTs in 4 months  Marshell Garfinkel MD [facility] 09/26/2022, 8:54 AM  CC: Collene Leyden, MD

## 2022-09-27 ENCOUNTER — Ambulatory Visit: Payer: PPO | Admitting: Cardiology

## 2022-09-27 ENCOUNTER — Encounter (HOSPITAL_COMMUNITY)
Admission: RE | Admit: 2022-09-27 | Discharge: 2022-09-27 | Disposition: A | Discharge status: Home / Self Care | Payer: PPO | Source: Ambulatory Visit | Attending: Pulmonary Disease | Admitting: Pulmonary Disease

## 2022-09-27 DIAGNOSIS — J849 Interstitial pulmonary disease, unspecified: Secondary | ICD-10-CM | POA: Diagnosis not present

## 2022-09-27 NOTE — Progress Notes (Signed)
Daily Session Note  Patient Details  Name: Susan Bray MRN: [id number] Date of Birth: [birth year] Referring Provider:   [name] Pulmonary Rehab Walk Test from 07/27/2022 in [facility] for Heart, Vascular, & [place]  Referring Provider [name]       Encounter Date: 09/27/2022  Check In:  Session Check In - 09/27/22 1140       Check-In   Supervising physician immediately available to respond to emergencies [facility] - Physician supervision    Physician(s) Agarwala    [facility]    Staff Present Elmon Else, MS, ACSM-CEP, Exercise Physiologist;Samantha [place], RD, LDN; Olen Cordial BS, ACSM-CEP, Exercise Physiologist;Other    Virtual Visit No    Medication changes reported     No    Fall or balance concerns reported    No    Tobacco Cessation No Change    Warm-up and Cool-down Performed as group-led instruction    Resistance Training Performed Yes    VAD Patient? No    PAD/SET Patient? No      Pain Assessment   Currently in Pain? No/denies    Multiple Pain Sites No             Capillary Blood Glucose: No results found for this or any previous visit (from the past 24 hour(s)).    Social History   Tobacco Use  Smoking Status Never  Smokeless Tobacco Never    Goals Met:  Independence with exercise equipment Exercise tolerated well No report of concerns or symptoms today Strength training completed today  Goals Unmet:  Not Applicable  Comments: Service time is from 1011 to 1150.    Dr. Rodman Pickle is Medical Director for Pulmonary Rehab at [facility].

## 2022-10-01 NOTE — Telephone Encounter (Signed)
Through [employer], price for medication is $45 per month. Unfortunately, [employer] Medicare plans are strict to FDA-approved diagnosis. I called patient and advised that we will submit for the appeal but do not anticipate positive response.  Submitted an appeal to  [employer]  for  [place] . Turnaround time is 7 days.  Phone: [phone] Fax: [phone]  Knox Saliva, PharmD, MPH, BCPS, CPP Clinical Pharmacist (Rheumatology and Pulmonology)

## 2022-10-02 ENCOUNTER — Encounter (HOSPITAL_COMMUNITY)
Admission: RE | Admit: 2022-10-02 | Discharge: 2022-10-02 | Disposition: A | Discharge status: Home / Self Care | Payer: PPO | Source: Ambulatory Visit | Attending: Pulmonary Disease | Admitting: Pulmonary Disease

## 2022-10-02 DIAGNOSIS — J849 Interstitial pulmonary disease, unspecified: Secondary | ICD-10-CM | POA: Diagnosis not present

## 2022-10-02 NOTE — Progress Notes (Signed)
Daily Session Note  Patient Details  Name: Susan Bray MRN: [id number] Date of Birth: May 05, 1946 Referring Provider:   [name] Pulmonary Rehab Walk Test from 07/27/2022 in [facility] for Heart, Vascular, & [place]  Referring Provider [name]       Encounter Date: 10/02/2022  Check In:  Session Check In - 10/02/22 1134       Check-In   Supervising physician immediately available to respond to emergencies [facility] - Physician supervision    Physician(s) Dr. Tacy Learn    [facility]    Staff Present Elmon Else, MS, ACSM-CEP, Exercise Physiologist; Ysidro Evert, RN;Randi Olen Cordial BS, ACSM-CEP, Exercise Physiologist;Other    Virtual Visit No    Medication changes reported     No    Fall or balance concerns reported    No    Tobacco Cessation No Change    Warm-up and Cool-down Performed as group-led instruction    Resistance Training Performed Yes    VAD Patient? No    PAD/SET Patient? No      Pain Assessment   Currently in Pain? No/denies    Multiple Pain Sites No             Capillary Blood Glucose: No results found for this or any previous visit (from the past 24 hour(s)).    Social History   Tobacco Use  Smoking Status Never  Smokeless Tobacco Never    Goals Met:  Proper associated with RPD/PD & O2 Sat Exercise tolerated well No report of concerns or symptoms today Strength training completed today  Goals Unmet:  Not Applicable  Comments: Service time is from [place] to [place]    Dr. Rodman Pickle is Medical Director for Pulmonary Rehab at [facility].

## 2022-10-04 ENCOUNTER — Encounter (HOSPITAL_COMMUNITY)
Admission: RE | Admit: 2022-10-04 | Discharge: 2022-10-04 | Disposition: A | Discharge status: Home / Self Care | Payer: PPO | Source: Ambulatory Visit | Attending: Pulmonary Disease | Admitting: Pulmonary Disease

## 2022-10-04 DIAGNOSIS — J849 Interstitial pulmonary disease, unspecified: Secondary | ICD-10-CM

## 2022-10-04 NOTE — Progress Notes (Signed)
Daily Session Note  Patient Details  Name: Susan Bray MRN: [id number] Date of Birth: 16-Aug-1946 Referring Provider:   April Bray Pulmonary Rehab Walk Test from 07/27/2022 in [facility] for Heart, Vascular, & [place]  Referring Provider Susan Bray       Encounter Date: 10/04/2022  Check In:  Session Check In - 10/04/22 1132       Check-In   Supervising physician immediately available to respond to emergencies [facility] - Physician supervision    Physician(s) Susan Bray    [facility]    Staff Present Susan Else, MS, ACSM-CEP, Exercise Physiologist;Susan Bray, ACSM-CEP, Exercise Physiologist;Susan Ysidro Evert, RN;Other    Virtual Visit No    Medication changes reported     No    Fall or balance concerns reported    No    Tobacco Cessation No Change    Warm-up and Cool-down Performed as group-led instruction    Resistance Training Performed Yes    VAD Patient? No    PAD/SET Patient? No      Pain Assessment   Currently in Pain? No/denies             Capillary Blood Glucose: No results found for this or any previous visit (from the past 24 hour(s)).    Social History   Tobacco Use  Smoking Status Never  Smokeless Tobacco Never    Goals Met:  Exercise tolerated well No report of concerns or symptoms today Strength training completed today  Goals Unmet:  Not Applicable  Comments: Service time is from 1016 to [place]    Susan Bray is Medical Director for Pulmonary Rehab at [facility].

## 2022-10-09 ENCOUNTER — Encounter (HOSPITAL_COMMUNITY)
Admission: RE | Admit: 2022-10-09 | Discharge: 2022-10-09 | Disposition: A | Discharge status: Home / Self Care | Payer: PPO | Source: Ambulatory Visit | Attending: Pulmonary Disease | Admitting: Pulmonary Disease

## 2022-10-09 VITALS — Wt 127.6 lb

## 2022-10-09 DIAGNOSIS — J849 Interstitial pulmonary disease, unspecified: Secondary | ICD-10-CM

## 2022-10-10 ENCOUNTER — Telehealth: Payer: Self-pay

## 2022-10-10 NOTE — Telephone Encounter (Signed)
Appeal for azathioprine has been DENIED.   Denial letter has been attached in patient documents.

## 2022-10-10 NOTE — Progress Notes (Signed)
Pulmonary Individual Treatment Plan  Patient Details  Name: Susan Bray MRN: [id number] Date of Birth: 08-11-1946 Referring Provider:   [name] Pulmonary Rehab Walk Test from 07/27/2022 in [facility] [place]  Referring Provider [name]       Initial Encounter Date:  Flowsheet Row Pulmonary Rehab Walk Test from 07/27/2022 in [facility], & [place]  Date 07/27/22       Visit Diagnosis: ILD (interstitial lung disease) ([place])  Patient's Home Medications on Admission:   Current Outpatient Medications:    apixaban (ELIQUIS) 5 MG TABS tablet, Take 1 tablet (5 mg total) by mouth 2 (two) times daily., Disp: 180 tablet, Rfl: 1   azaTHIOprine (IMURAN) 50 MG tablet, Take 3 tablets (150 mg total) by mouth daily., Disp: 90 tablet, Rfl: 5   clobetasol ointment (TEMOVATE) 0.05 %, Apply topically 2 (two) times daily., Disp: , Rfl:    diltiazem (CARDIZEM CD) 120 MG 24 hr capsule, Take 1 capsule (120 mg total) by mouth daily., Disp: 90 capsule, Rfl: 3   ezetimibe (ZETIA) 10 MG tablet, Take 10 mg by mouth daily., Disp: , Rfl:    fenofibrate micronized (LOFIBRA) 134 MG capsule, Take 134 mg by mouth daily before breakfast., Disp: , Rfl:    predniSONE (DELTASONE) 10 MG tablet, Take 5 mg by mouth daily., Disp: , Rfl:    SYNTHROID 50 MCG tablet, Take by mouth., Disp: , Rfl:   Past Medical History: Past Medical History:  Diagnosis Date   CAD (coronary artery disease)    Cataract    Hyperlipidemia    IBS (irritable bowel syndrome)    VIN III (vulvar intraepithelial neoplasia III)     Tobacco Use: Social History   Tobacco Use  Smoking Status Never  Smokeless Tobacco Never    Labs: Review Flowsheet       Latest Ref Rng & Units 09/08/2014 11/15/2015  Labs for ITP Cardiac and Pulmonary Rehab  Cholestrol 125 - 200 mg/dL 180  149   LDL (calc) <130 mg/dL 113  79   HDL-C >=46 mg/dL 46.20  47    Trlycerides <150 mg/dL 106.0  117     Capillary Blood Glucose: No results found for: "GLUCAP"   Pulmonary Assessment Scores:  Pulmonary Assessment Scores     Row Name 07/27/22 1102         ADL UCSD   ADL Phase Entry     SOB Score total 53       CAT Score   CAT Score 14       mMRC Score   mMRC Score 3             UCSD: Self-administered rating of dyspnea associated with activities of daily living (ADLs) 6-point scale (0 = "not at all" to 5 = "maximal or unable to do because of breathlessness")  Scoring Scores range from 0 to 120.  Minimally important difference is 5 units  CAT: CAT can identify the health impairment of COPD patients and is better correlated with disease progression.  CAT has a scoring range of zero to 40. The CAT score is classified into four groups of low (less than 10), medium (10 - 20), high (21-30) and very high (31-40) based on the impact level of disease on health status. A CAT score over 10 suggests significant symptoms.  A worsening CAT score could be explained by an exacerbation, poor medication adherence, poor inhaler  technique, or progression of COPD or comorbid conditions.  CAT MCID is 2 points  mMRC: mMRC (Modified Medical Research Council) Dyspnea Scale is used to assess the degree of baseline functional disability in patients of respiratory disease due to dyspnea. No minimal important difference is established. A decrease in score of 1 point or greater is considered a positive change.   Pulmonary Function Assessment:  Pulmonary Function Assessment - 07/27/22 1102       Breath   Bilateral Breath Sounds Clear    Shortness of Breath Yes;Fear of Shortness of Breath;Limiting activity             Exercise Target Goals: Exercise Program Goal: Individual exercise prescription set using results from initial 6 min walk test and THRR while considering  patient's activity barriers and safety.   Exercise Prescription Goal: Initial  exercise prescription builds to 30-45 minutes a day of aerobic activity, 2-3 days per week.  Home exercise guidelines will be given to patient during program as part of exercise prescription that the participant will acknowledge.  Activity Barriers & Risk Stratification:  Activity Barriers & Cardiac Risk Stratification - 07/27/22 0925       Activity Barriers & Cardiac Risk Stratification   Activity Barriers Deconditioning;Muscular Weakness;Shortness of Breath;History of Falls    Cardiac Risk Stratification Moderate             6 Minute Walk:  6 Minute Walk     Row Name 07/27/22 1105         6 Minute Walk   Phase Initial     Distance 1005 feet     Walk Time 6 minutes     # of Rest Breaks 0     MPH 3.07     METS 3.35     RPE 13     Perceived Dyspnea  3     VO2 Peak 11.71     Symptoms No     Resting HR 69 bpm     Resting BP 120/60     Resting Oxygen Saturation  97 %     Exercise Oxygen Saturation  during 6 min walk 93 %     Max Ex. HR 80 bpm     Max Ex. BP 138/60     2 Minute Post BP 124/60       Interval HR   1 Minute HR 77     2 Minute HR 80     3 Minute HR 79     4 Minute HR 76     5 Minute HR 80     6 Minute HR 78     2 Minute Post HR 64     Interval Heart Rate? Yes       Interval Oxygen   Interval Oxygen? Yes     Baseline Oxygen Saturation % 97 %     1 Minute Oxygen Saturation % 97 %     1 Minute Liters of Oxygen 0 L     2 Minute Oxygen Saturation % 99 %     2 Minute Liters of Oxygen 0 L     3 Minute Oxygen Saturation % 93 %     3 Minute Liters of Oxygen 0 L     4 Minute Oxygen Saturation % 98 %     4 Minute Liters of Oxygen 0 L     5 Minute Oxygen Saturation % 96 %     5 Minute Liters of Oxygen 0 L  6 Minute Oxygen Saturation % 97 %     6 Minute Liters of Oxygen 0 L     2 Minute Post Oxygen Saturation % 100 %     2 Minute Post Liters of Oxygen 64 L              Oxygen Initial Assessment:  Oxygen Initial Assessment - 07/27/22 0921        Home Oxygen   Home Oxygen Device None    Sleep Oxygen Prescription None    Home Exercise Oxygen Prescription None    Home Resting Oxygen Prescription None      Initial 6 min Walk   Oxygen Used None      Program Oxygen Prescription   Program Oxygen Prescription None      Intervention   Short Term Goals To learn and exhibit compliance with exercise, home and travel O2 prescription;To learn and understand importance of monitoring SPO2 with pulse oximeter and demonstrate accurate use of the pulse oximeter.;To learn and understand importance of maintaining oxygen saturations>88%;To learn and demonstrate proper pursed lip breathing techniques or other breathing techniques. ;To learn and demonstrate proper use of respiratory medications    Long  Term Goals Exhibits compliance with exercise, home  and travel O2 prescription;Verbalizes importance of monitoring SPO2 with pulse oximeter and return demonstration;Maintenance of O2 saturations>88%;Exhibits proper breathing techniques, such as pursed lip breathing or other method taught during program session;Compliance with respiratory medication;Demonstrates proper use of MDI's             Oxygen Re-Evaluation:  Oxygen Re-Evaluation     Row Name 08/06/22 1201 09/05/22 0725 10/03/22 0732         Program Oxygen Prescription   Program Oxygen Prescription None None None       Home Oxygen   Home Oxygen Device None None None     Sleep Oxygen Prescription None None None     Home Exercise Oxygen Prescription None None None     Home Resting Oxygen Prescription None None None       Goals/Expected Outcomes   Short Term Goals To learn and exhibit compliance with exercise, home and travel O2 prescription;To learn and understand importance of monitoring SPO2 with pulse oximeter and demonstrate accurate use of the pulse oximeter.;To learn and understand importance of maintaining oxygen saturations>88%;To learn and demonstrate proper pursed lip  breathing techniques or other breathing techniques. ;To learn and demonstrate proper use of respiratory medications To learn and exhibit compliance with exercise, home and travel O2 prescription;To learn and understand importance of monitoring SPO2 with pulse oximeter and demonstrate accurate use of the pulse oximeter.;To learn and understand importance of maintaining oxygen saturations>88%;To learn and demonstrate proper pursed lip breathing techniques or other breathing techniques. ;To learn and demonstrate proper use of respiratory medications To learn and exhibit compliance with exercise, home and travel O2 prescription;To learn and understand importance of monitoring SPO2 with pulse oximeter and demonstrate accurate use of the pulse oximeter.;To learn and understand importance of maintaining oxygen saturations>88%;To learn and demonstrate proper pursed lip breathing techniques or other breathing techniques. ;To learn and demonstrate proper use of respiratory medications     Long  Term Goals Exhibits compliance with exercise, home  and travel O2 prescription;Verbalizes importance of monitoring SPO2 with pulse oximeter and return demonstration;Maintenance of O2 saturations>88%;Exhibits proper breathing techniques, such as pursed lip breathing or other method taught during program session;Compliance with respiratory medication;Demonstrates proper use of MDI's Exhibits compliance with exercise, home  and  travel O2 prescription;Verbalizes importance of monitoring SPO2 with pulse oximeter and return demonstration;Maintenance of O2 saturations>88%;Exhibits proper breathing techniques, such as pursed lip breathing or other method taught during program session;Compliance with respiratory medication;Demonstrates proper use of MDI's Exhibits compliance with exercise, home  and travel O2 prescription;Verbalizes importance of monitoring SPO2 with pulse oximeter and return demonstration;Maintenance of O2  saturations>88%;Exhibits proper breathing techniques, such as pursed lip breathing or other method taught during program session;Compliance with respiratory medication;Demonstrates proper use of MDI's     Goals/Expected Outcomes Compliance and understanding of oxygen saturation monitoring and breathing techniques to decrease shortness of breath. Compliance and understanding of oxygen saturation monitoring and breathing techniques to decrease shortness of breath. Compliance and understanding of oxygen saturation monitoring and breathing techniques to decrease shortness of breath.              Oxygen Discharge (Final Oxygen Re-Evaluation):  Oxygen Re-Evaluation - 10/03/22 0732       Program Oxygen Prescription   Program Oxygen Prescription None      Home Oxygen   Home Oxygen Device None    Sleep Oxygen Prescription None    Home Exercise Oxygen Prescription None    Home Resting Oxygen Prescription None      Goals/Expected Outcomes   Short Term Goals To learn and exhibit compliance with exercise, home and travel O2 prescription;To learn and understand importance of monitoring SPO2 with pulse oximeter and demonstrate accurate use of the pulse oximeter.;To learn and understand importance of maintaining oxygen saturations>88%;To learn and demonstrate proper pursed lip breathing techniques or other breathing techniques. ;To learn and demonstrate proper use of respiratory medications    Long  Term Goals Exhibits compliance with exercise, home  and travel O2 prescription;Verbalizes importance of monitoring SPO2 with pulse oximeter and return demonstration;Maintenance of O2 saturations>88%;Exhibits proper breathing techniques, such as pursed lip breathing or other method taught during program session;Compliance with respiratory medication;Demonstrates proper use of MDI's    Goals/Expected Outcomes Compliance and understanding of oxygen saturation monitoring and breathing techniques to decrease  shortness of breath.             Initial Exercise Prescription:  Initial Exercise Prescription - 07/27/22 1100       Date of Initial Exercise RX and Referring Provider   Date 07/27/22    Referring Provider [name]    Expected Discharge Date 10/04/22      Recumbant Bike   Level 1    Minutes 15    METs 2      NuStep   Level 1    SPM 60    Minutes 15    METs 1.8      Prescription Details   Frequency (times per week) 2    Duration Progress to 30 minutes of continuous aerobic without signs/symptoms of physical distress      Intensity   THRR 40-80% of Max Heartrate 58-117    Ratings of Perceived Exertion 11-13    Perceived Dyspnea 0-4      Progression   Progression Continue progressive overload as per policy without signs/symptoms or physical distress.      Resistance Training   Training Prescription Yes    Weight red bands    Reps 10-15             Perform Capillary Blood Glucose checks as needed.  Exercise Prescription Changes:   Exercise Prescription Changes     Row Name 08/07/22 1200 08/16/22 1200 08/28/22 1200 09/06/22 1637 09/25/22 1200  Response to Exercise   Blood Pressure (Admit) 118/62 114/54 114/62 96/40 112/52   Blood Pressure (Exercise) 130/60 132/60 110/58 -- 126/56   Blood Pressure (Exit) 106/60 104/62 120/66 108/54 110/60   Heart Rate (Admit) 60 bpm 64 bpm 95 bpm 59 bpm 62 bpm   Heart Rate (Exercise) 75 bpm 81 bpm 73 bpm 104 bpm 84 bpm   Heart Rate (Exit) 59 bpm 64 bpm 63 bpm 61 bpm 68 bpm   Oxygen Saturation (Admit) 99 % 100 % 98 % 98 % 98 %   Oxygen Saturation (Exercise) 97 % 100 % 96 % 97 % 98 %   Oxygen Saturation (Exit) 98 % 99 % 97 % 97 % 97 %   Rating of Perceived Exertion (Exercise) _0 Perceived Dyspnea (Exercise) _1 Duration Progress to 30 minutes of  aerobic without signs/symptoms of physical distress Continue with 30 min of aerobic exercise without signs/symptoms of physical distress. Continue with 30  min of aerobic exercise without signs/symptoms of physical distress. Continue with 30 min of aerobic exercise without signs/symptoms of physical distress. Continue with 30 min of aerobic exercise without signs/symptoms of physical distress.   Intensity Other (comment)  40-80% of HRR THRR unchanged THRR unchanged THRR unchanged THRR unchanged     Progression   Progression Continue to progress workloads to maintain intensity without signs/symptoms of physical distress. Continue to progress workloads to maintain intensity without signs/symptoms of physical distress. Continue to progress workloads to maintain intensity without signs/symptoms of physical distress. Continue to progress workloads to maintain intensity without signs/symptoms of physical distress. Continue to progress workloads to maintain intensity without signs/symptoms of physical distress.     Resistance Training   Training Prescription _2    Weight _3    Reps 10-15 10-15 10-15 10-15 10-15   Time 10 Minutes 10 Minutes 10 Minutes 10 Minutes 10 Minutes     Interval Training   Interval Training -- No -- -- --     Recumbant Bike   Level _4 Minutes _5 METs 1.8 2 3.3 3.7 4.9     NuStep   Level _6 SPM 70 70 70 70 70   Minutes _7 METs 2 2.2 2.4 2.2 2.3            Exercise Comments:   Exercise Comments     Row Name 08/02/22 1217 09/18/22 1156         Exercise Comments Pt completed her first day of exercise. She exercised on the recumbent bike and Nustep for 15 min. Susan Bray averaged 1.9 METs at level 1 on the recumbent bike and 2.1 METs at level 1 on the Nustep. She performed the warmup and cooldown standing without limitations. Completed home exercise plan. Susan Bray is currently exercising at home. She walks 2-3 non-rehab days/wk for 10-15 min/day. I encouraged Susan Bray to increase her time to 30 min/day. I told her that she  can split her time into 3x10 min or 2x15 min. I also recommended increased frequency when she gradautes the program. Susan Bray agreed with my recommendations. Susan Bray is motivated to exercise and improve her functional capacity.               Exercise Goals and Review:   Exercise Goals  Susan Bray Name 07/27/22 1914 08/06/22 1158 09/05/22 0723 10/03/22 0728       Exercise Goals   Increase Physical Activity Yes Yes Yes Yes    Intervention Provide advice, education, support and counseling about physical activity/exercise needs.;Develop an individualized exercise prescription for aerobic and resistive training based on initial evaluation findings, risk stratification, comorbidities and participant's personal goals. Provide advice, education, support and counseling about physical activity/exercise needs.;Develop an individualized exercise prescription for aerobic and resistive training based on initial evaluation findings, risk stratification, comorbidities and participant's personal goals. Provide advice, education, support and counseling about physical activity/exercise needs.;Develop an individualized exercise prescription for aerobic and resistive training based on initial evaluation findings, risk stratification, comorbidities and participant's personal goals. Provide advice, education, support and counseling about physical activity/exercise needs.;Develop an individualized exercise prescription for aerobic and resistive training based on initial evaluation findings, risk stratification, comorbidities and participant's personal goals.    Expected Outcomes Short Term: Attend rehab on a regular basis to increase amount of physical activity.;Long Term: Add in home exercise to make exercise part of routine and to increase amount of physical activity.;Long Term: Exercising regularly at least 3-5 days a week. Short Term: Attend rehab on a regular basis to increase amount of physical activity.;Long Term: Add in  home exercise to make exercise part of routine and to increase amount of physical activity.;Long Term: Exercising regularly at least 3-5 days a week. Short Term: Attend rehab on a regular basis to increase amount of physical activity.;Long Term: Add in home exercise to make exercise part of routine and to increase amount of physical activity.;Long Term: Exercising regularly at least 3-5 days a week. Short Term: Attend rehab on a regular basis to increase amount of physical activity.;Long Term: Add in home exercise to make exercise part of routine and to increase amount of physical activity.;Long Term: Exercising regularly at least 3-5 days a week.    Increase Strength and Stamina Yes Yes Yes Yes    Intervention Provide advice, education, support and counseling about physical activity/exercise needs.;Develop an individualized exercise prescription for aerobic and resistive training based on initial evaluation findings, risk stratification, comorbidities and participant's personal goals. Provide advice, education, support and counseling about physical activity/exercise needs.;Develop an individualized exercise prescription for aerobic and resistive training based on initial evaluation findings, risk stratification, comorbidities and participant's personal goals. Provide advice, education, support and counseling about physical activity/exercise needs.;Develop an individualized exercise prescription for aerobic and resistive training based on initial evaluation findings, risk stratification, comorbidities and participant's personal goals. Provide advice, education, support and counseling about physical activity/exercise needs.;Develop an individualized exercise prescription for aerobic and resistive training based on initial evaluation findings, risk stratification, comorbidities and participant's personal goals.    Expected Outcomes Short Term: Increase workloads from initial exercise prescription for resistance,  speed, and METs.;Short Term: Perform resistance training exercises routinely during rehab and add in resistance training at home;Long Term: Improve cardiorespiratory fitness, muscular endurance and strength as measured by increased METs and functional capacity (6MWT) Short Term: Increase workloads from initial exercise prescription for resistance, speed, and METs.;Short Term: Perform resistance training exercises routinely during rehab and add in resistance training at home;Long Term: Improve cardiorespiratory fitness, muscular endurance and strength as measured by increased METs and functional capacity (6MWT) Short Term: Increase workloads from initial exercise prescription for resistance, speed, and METs.;Short Term: Perform resistance training exercises routinely during rehab and add in resistance training at home;Long Term: Improve cardiorespiratory fitness, muscular endurance and strength as measured by increased  METs and functional capacity (6MWT) Short Term: Increase workloads from initial exercise prescription for resistance, speed, and METs.;Short Term: Perform resistance training exercises routinely during rehab and add in resistance training at home;Long Term: Improve cardiorespiratory fitness, muscular endurance and strength as measured by increased METs and functional capacity (6MWT)    Able to understand and use rate of perceived exertion (RPE) scale Yes Yes Yes Yes    Intervention Provide education and explanation on how to use RPE scale Provide education and explanation on how to use RPE scale Provide education and explanation on how to use RPE scale Provide education and explanation on how to use RPE scale    Expected Outcomes Short Term: Able to use RPE daily in rehab to express subjective intensity level;Long Term:  Able to use RPE to guide intensity level when exercising independently Short Term: Able to use RPE daily in rehab to express subjective intensity level;Long Term:  Able to use RPE  to guide intensity level when exercising independently Short Term: Able to use RPE daily in rehab to express subjective intensity level;Long Term:  Able to use RPE to guide intensity level when exercising independently Short Term: Able to use RPE daily in rehab to express subjective intensity level;Long Term:  Able to use RPE to guide intensity level when exercising independently    Able to understand and use Dyspnea scale Yes Yes Yes Yes    Intervention Provide education and explanation on how to use Dyspnea scale Provide education and explanation on how to use Dyspnea scale Provide education and explanation on how to use Dyspnea scale Provide education and explanation on how to use Dyspnea scale    Expected Outcomes Short Term: Able to use Dyspnea scale daily in rehab to express subjective sense of shortness of breath during exertion;Long Term: Able to use Dyspnea scale to guide intensity level when exercising independently Short Term: Able to use Dyspnea scale daily in rehab to express subjective sense of shortness of breath during exertion;Long Term: Able to use Dyspnea scale to guide intensity level when exercising independently Short Term: Able to use Dyspnea scale daily in rehab to express subjective sense of shortness of breath during exertion;Long Term: Able to use Dyspnea scale to guide intensity level when exercising independently Short Term: Able to use Dyspnea scale daily in rehab to express subjective sense of shortness of breath during exertion;Long Term: Able to use Dyspnea scale to guide intensity level when exercising independently    Knowledge and understanding of Target Heart Rate Range (THRR) Yes Yes Yes Yes    Intervention Provide education and explanation of THRR including how the numbers were predicted and where they are located for reference Provide education and explanation of THRR including how the numbers were predicted and where they are located for reference Provide education and  explanation of THRR including how the numbers were predicted and where they are located for reference Provide education and explanation of THRR including how the numbers were predicted and where they are located for reference    Expected Outcomes Short Term: Able to state/look up THRR;Long Term: Able to use THRR to govern intensity when exercising independently;Short Term: Able to use daily as guideline for intensity in rehab Short Term: Able to state/look up THRR;Long Term: Able to use THRR to govern intensity when exercising independently;Short Term: Able to use daily as guideline for intensity in rehab Short Term: Able to state/look up THRR;Long Term: Able to use THRR to govern intensity when exercising independently;Short Term:  Able to use daily as guideline for intensity in rehab Short Term: Able to state/look up THRR;Long Term: Able to use THRR to govern intensity when exercising independently;Short Term: Able to use daily as guideline for intensity in rehab    Understanding of Exercise Prescription Yes Yes Yes Yes    Intervention Provide education, explanation, and written materials on patient's individual exercise prescription Provide education, explanation, and written materials on patient's individual exercise prescription Provide education, explanation, and written materials on patient's individual exercise prescription Provide education, explanation, and written materials on patient's individual exercise prescription    Expected Outcomes Short Term: Able to explain program exercise prescription;Long Term: Able to explain home exercise prescription to exercise independently Short Term: Able to explain program exercise prescription;Long Term: Able to explain home exercise prescription to exercise independently Short Term: Able to explain program exercise prescription;Long Term: Able to explain home exercise prescription to exercise independently Short Term: Able to explain program exercise  prescription;Long Term: Able to explain home exercise prescription to exercise independently             Exercise Goals Re-Evaluation :  Exercise Goals Re-Evaluation     Row Name 08/06/22 1158 09/05/22 0723 10/03/22 0728         Exercise Goal Re-Evaluation   Exercise Goals Review Increase Physical Activity;Increase Strength and Stamina;Able to understand and use rate of perceived exertion (RPE) scale;Able to understand and use Dyspnea scale;Knowledge and understanding of Target Heart Rate Range (THRR);Understanding of Exercise Prescription Increase Physical Activity;Increase Strength and Stamina;Able to understand and use rate of perceived exertion (RPE) scale;Able to understand and use Dyspnea scale;Knowledge and understanding of Target Heart Rate Range (THRR);Understanding of Exercise Prescription Increase Physical Activity;Increase Strength and Stamina;Able to understand and use rate of perceived exertion (RPE) scale;Able to understand and use Dyspnea scale;Knowledge and understanding of Target Heart Rate Range (THRR);Understanding of Exercise Prescription     Comments Susan Bray has completed 1 exercise session. She exercises on the recumbent bike and Nustep for 15 min. She averages 1.9 METs at level 1 on the recumbent bike and 2.1 METs at level 1 on the Nustep. She performs the warmup and cooldown standing without limitations. It is too soon to note any discernable progressions. Will continue to monitor and progress as able. Markel has completed 9 exercise sessions. She exercises on the recumbent bike and Nustep for 15 min. She averages 3.8 METs at level 1 on the recumbent bike and 2.2 METs at level 4 on the Nustep. She performs the warmup and cooldown standing without limitations. Susan Bray has increased her workload for exercise modes. She has tolerated progressions well as METs have increased. Susan Bray seems motivated to exercise and improve her functional capacity. Will continue to monitor and progress  as able. Susan Bray has completed 15 exercise sessions. She exercises on the recumbent bike and Nustep for 15 min. She averages 4.6 METs at level 4 on the recumbent bike and 3.2 METs at level 4 on the Nustep. She performs the warmup and cooldown standing without limitations. Susan Bray has increased her workload for exercise modes multiple time. She still tolerates progressions well as METs have increased. Susan Bray does exercise outside of  rehab 2-3 days/wk. We have recently looked at fitness centers that accept [place]. Will continue to monitor and progress as able.     Expected Outcomes Through exercise at rehab and home, the patient will decrease shortness of breath with daily activities and feel confident in carrying out an exercise regimen at home. Through  exercise at rehab and home, the patient will decrease shortness of breath with daily activities and feel confident in carrying out an exercise regimen at home. Through exercise at rehab and home, the patient will decrease shortness of breath with daily activities and feel confident in carrying out an exercise regimen at home.              Discharge Exercise Prescription (Final Exercise Prescription Changes):  Exercise Prescription Changes - 09/25/22 1200       Response to Exercise   Blood Pressure (Admit) 112/52    Blood Pressure (Exercise) 126/56    Blood Pressure (Exit) 110/60    Heart Rate (Admit) 62 bpm    Heart Rate (Exercise) 84 bpm    Heart Rate (Exit) 68 bpm    Oxygen Saturation (Admit) 98 %    Oxygen Saturation (Exercise) 98 %    Oxygen Saturation (Exit) 97 %    Rating of Perceived Exertion (Exercise) 13    Perceived Dyspnea (Exercise) 2    Duration Continue with 30 min of aerobic exercise without signs/symptoms of physical distress.    Intensity THRR unchanged      Progression   Progression Continue to progress workloads to maintain intensity without signs/symptoms of physical distress.      Resistance Training   Training  Prescription Yes    Weight Red bands    Reps 10-15    Time 10 Minutes      Recumbant Bike   Level 4    Minutes 15    METs 4.9      NuStep   Level 5    SPM 70    Minutes 15    METs 2.3             Nutrition:  Target Goals: Understanding of nutrition guidelines, daily intake of sodium <1511m, cholesterol <2034m calories 30% from fat and 7% or less from saturated fats, daily to have 5 or more servings of fruits and vegetables.  Biometrics:  Pre Biometrics - 07/27/22 0910       Pre Biometrics   Grip Strength 19 kg              Nutrition Therapy Plan and Nutrition Goals:  Nutrition Therapy & Goals - 09/25/22 1153       Nutrition Therapy   Diet Heart Healthy diet      Personal Nutrition Goals   Nutrition Goal Patient to identify strategies for weight maintanence/weight gain of 0.5-2.0# per week.    Personal Goal #2 Patient to reduce sodium intake to <200043mer day    Personal Goal #3 Patient to identify food sources and limit daily intake of saturated fat, trans fat, sodium, and refined carbohydrates    Comments Goals in progress. WanMagdalenes follow-up appointments with both GI and cardiology in January 2024. Unwanted weight loss has subsided and she is up 3.3# since starting with our program. She reports her cholesterol remains elevated >300m59mL; we discussed dietary intervention for heart health including high fiber foods and portions, reducing sodium intake <2300-2000mg26m, reducing saturated fat intake (breakfast meats, processed meats,etc), and omega 3 rich foods. She continues to eat on a regular schedule and reports no change in appetite. She does eat out frequently.      Intervention Plan   Intervention Prescribe, educate and counsel regarding individualized specific dietary modifications aiming towards targeted core components such as weight, hypertension, lipid management, diabetes, heart failure and other comorbidities.;Nutrition handout(s) given to  patient.  Expected Outcomes Short Term Goal: Understand basic principles of dietary content, such as calories, fat, sodium, cholesterol and nutrients.;Short Term Goal: A plan has been developed with personal nutrition goals set during dietitian appointment.;Long Term Goal: Adherence to prescribed nutrition plan.             Nutrition Assessments:  Nutrition Assessments - 08/02/22 1150       Rate Your Plate Scores   Pre Score 57            MEDIFICTS Score Key: ?70 Need to make dietary changes  40-70 Heart Healthy Diet ? 40 Therapeutic Level Cholesterol Diet  Flowsheet Row PULMONARY REHAB OTHER RESPIRATORY from 08/02/2022 in [facility] for Heart, Vascular, & Lung Health  Picture Your Plate Total Score on Admission 57      Picture Your Plate Scores: <53 Unhealthy dietary pattern with much room for improvement. 41-50 Dietary pattern unlikely to meet recommendations for good health and room for improvement. 51-60 More healthful dietary pattern, with some room for improvement.  >60 Healthy dietary pattern, although there may be some specific behaviors that could be improved.    Nutrition Goals Re-Evaluation:  Nutrition Goals Re-Evaluation     Sylvester Name 08/02/22 1132 08/28/22 1138 09/25/22 1153         Goals   Current Weight 124 lb 5.4 oz (56.4 kg) 125 lb 7.1 oz (56.9 kg) 126 lb 1.7 oz (57.2 kg)     Comment LDL 143, triglycerides 161, cholesterol 221 No new labs; most recent labs show LDL 143, triglycerides 161, cholesterol 221 Unable to view lipid panel as it is outside epic; patient reports elevated cholesterol     Expected Outcome Susan Bray reports concerns of GI discomfort including gas and bloating over the last month; she does have appointment with Gastroenterology in November. She also has history of IBS, GERD. She is down ~10# of unintentional weight loss since 09/21/2021 at 134#. In addition, per cardiology notes, she reports not tolerating  statin medications and declines PCSK9 intervention. We discussed strategies for weight maintenance and weight gain; she is amicable to introducing 1 nutrition supplements such as Boost or Ensure daily. She reports "grazing"/snacking behaviors throughout the day and often eats out for dinner. Goals in progress. Susan Bray continues 1 nutrition supplement daily (220-250kcals, 9-16g protein)  to aid with unintentional weight loss with ongoing reports of gas and bloating. She is up 2.6# since starting with our program. She cancelled her appointment with GI for this month; stressed the importanc of re-scheduling appointment with GI. She continues follow-up with cardiology for A-fib, dyslipidemia, CAD; she does not tolerate statins and declines PCSK9 intervention per most recent follow-up on 11/2. Goals in progress. Susan Bray has follow-up appointments with both GI and cardiology in January 2024. Unwanted weight loss has subsided and she is up 3.3# since starting with our program. She reports her cholesterol remains elevated >372m/ dL; we discussed dietary intervention for heart health including high fiber foods and portions, reducing sodium intake <2300-20028mday, reducing saturated fat intake (breakfast meats, processed meats,etc), and omega 3 rich foods. She continues to eat on a regular schedule and reports no change in appetite. She does eat out frequently.              Nutrition Goals Discharge (Final Nutrition Goals Re-Evaluation):  Nutrition Goals Re-Evaluation - 09/25/22 1153       Goals   Current Weight 126 lb 1.7 oz (57.2 kg)    Comment Unable to view lipid  panel as it is outside epic; patient reports elevated cholesterol    Expected Outcome Goals in progress. Susan Bray has follow-up appointments with both GI and cardiology in January 2024. Unwanted weight loss has subsided and she is up 3.3# since starting with our program. She reports her cholesterol remains elevated >368m/ dL; we discussed dietary  intervention for heart health including high fiber foods and portions, reducing sodium intake <2300-20019mday, reducing saturated fat intake (breakfast meats, processed meats,etc), and omega 3 rich foods. She continues to eat on a regular schedule and reports no change in appetite. She does eat out frequently.             Psychosocial: Target Goals: Acknowledge presence or absence of significant depression and/or stress, maximize coping skills, provide positive support system. Participant is able to verbalize types and ability to use techniques and skills needed for reducing stress and depression.  Initial Review & Psychosocial Screening:  Initial Psych Review & Screening - 07/27/22 0920       Initial Review   Current issues with None Identified    Comments Lives w/ son. Does not report psychosocial barriers to exercise.      Family Dynamics   Good Support System? Yes    Comments son      Barriers   Psychosocial barriers to participate in program There are no identifiable barriers or psychosocial needs.      Screening Interventions   Interventions Encouraged to exercise             Quality of Life Scores:  Scores of 19 and below usually indicate a poorer quality of life in these areas.  A difference of  2-3 points is a clinically meaningful difference.  A difference of 2-3 points in the total score of the Quality of Life Index has been associated with significant improvement in overall quality of life, self-image, physical symptoms, and general health in studies assessing change in quality of life.  PHQ-9: Review Flowsheet       07/27/2022  Depression screen PHQ 2/9  Decreased Interest 0  Down, Depressed, Hopeless 0  PHQ - 2 Score 0  Altered sleeping 0  Tired, decreased energy 0  Change in appetite 0  Feeling bad or failure about yourself  0  Trouble concentrating 0  Moving slowly or fidgety/restless 0  Suicidal thoughts 0  PHQ-9 Score 0  Difficult doing  work/chores Not difficult at all   Interpretation of Total Score  Total Score Depression Severity:  1-4 = Minimal depression, 5-9 = Mild depression, 10-14 = Moderate depression, 15-19 = Moderately severe depression, 20-27 = Severe depression   Psychosocial Evaluation and Intervention:  Psychosocial Evaluation - 07/27/22 0923       Psychosocial Evaluation & Interventions   Interventions Encouraged to exercise with the program and follow exercise prescription    Comments Susan Bray not report any psychosocial concerns.    Expected Outcomes For Susan Bray participate in Pulmonary Rehab free of psychosocial concerns.    Continue Psychosocial Services  No Follow up required             Psychosocial Re-Evaluation:  Psychosocial Re-Evaluation     RoTexhomaame 08/10/22 [id number] 10/02/22 1603         Psychosocial Re-Evaluation   Current issues with None Identified None Identified None Identified     Comments Susan Bray been participating in rehab free of psychosocial concerns. She feels that the program has helped improved her conditioning level. Susan Bray  still participating in rehab free of psychosocial concerns. She enjoys the group exercise setting as she converses with other patients. Susan Bray has been attending the pulmonary rehab program without any psychosocial barriers. On her days off, she relaxes by reading.     Expected Outcomes For Refugia to participate in rehab free of psychosocial concerns. For Arrington to participate in rehab free of psychosocial concerns. Jacqlyn will continue to attend pulmonary rehab without any psychosocial barriers.     Interventions Encouraged to attend Pulmonary Rehabilitation for the exercise Encouraged to attend Pulmonary Rehabilitation for the exercise Encouraged to attend Pulmonary Rehabilitation for the exercise     Continue Psychosocial Services  No Follow up required No Follow up required No Follow up required              Psychosocial  Discharge (Final Psychosocial Re-Evaluation):  Psychosocial Re-Evaluation - 10/02/22 1603       Psychosocial Re-Evaluation   Current issues with None Identified    Comments Shacara has been attending the pulmonary rehab program without any psychosocial barriers. On her days off, she relaxes by reading.    Expected Outcomes Keaisha will continue to attend pulmonary rehab without any psychosocial barriers.    Interventions Encouraged to attend Pulmonary Rehabilitation for the exercise    Continue Psychosocial Services  No Follow up required             Education: Education Goals: Education classes will be provided on a weekly basis, covering required topics. Participant will state understanding/return demonstration of topics presented.  Learning Barriers/Preferences:  Learning Barriers/Preferences - 07/27/22 0924       Learning Barriers/Preferences   Learning Barriers Sight   Bilat eye surgery   Learning Preferences None             Education Topics: Introduction to Pulmonary Rehab Group instruction provided by PowerPoint, verbal discussion, and written material to support subject matter. Instructor reviews what Pulmonary Rehab is, the purpose of the program, and how patients are referred.     Know Your Numbers Group instruction that is supported by a PowerPoint presentation. Instructor discusses importance of knowing and understanding resting, exercise, and post-exercise oxygen saturation, heart rate, and blood pressure. Oxygen saturation, heart rate, blood pressure, rating of perceived exertion, and dyspnea are reviewed along with a normal range for these values.  Flowsheet Row PULMONARY REHAB OTHER RESPIRATORY from 10/04/2022 in [facility] for Heart, Vascular, & Lung Health  Date 08/09/22  Educator EP  Instruction Review Code 1- Verbalizes Understanding       Exercise for the Pulmonary Patient Group instruction that is supported by a PowerPoint  presentation. Instructor discusses benefits of exercise, core components of exercise, frequency, duration, and intensity of an exercise routine, importance of utilizing pulse oximetry during exercise, safety while exercising, and options of places to exercise outside of rehab.       MET Level  Group instruction provided by PowerPoint, verbal discussion, and written material to support subject matter. Instructor reviews what METs are and how to increase METs.    Pulmonary Medications Verbally interactive group education provided by instructor with focus on inhaled medications and proper administration.   Anatomy and Physiology of the Respiratory System Group instruction provided by PowerPoint, verbal discussion, and written material to support subject matter. Instructor reviews respiratory cycle and anatomical components of the respiratory system and their functions. Instructor also reviews differences in obstructive and restrictive respiratory diseases with examples of each.  [place]  OTHER RESPIRATORY from 10/04/2022 in [facility] for Heart, Vascular, & Lung Health  Date 08/02/22  Educator EP  Instruction Review Code 1- Verbalizes Understanding       Oxygen Safety Group instruction provided by PowerPoint, verbal discussion, and written material to support subject matter. There is an overview of "What is Oxygen" and "Why do we need it".  Instructor also reviews how to create a safe environment for oxygen use, the importance of using oxygen as prescribed, and the risks of noncompliance. There is a brief discussion on traveling with oxygen and resources the patient may utilize. Flowsheet Row PULMONARY REHAB OTHER RESPIRATORY from 10/04/2022 in [facility] for Heart, Vascular, & Lung Health  Date 08/30/22  Educator EP  Instruction Review Code 1- Verbalizes Understanding       Oxygen Use Group instruction provided by  PowerPoint, verbal discussion, and written material to discuss how supplemental oxygen is prescribed and different types of oxygen supply systems. Resources for more information are provided.  Flowsheet Row PULMONARY REHAB OTHER RESPIRATORY from 10/04/2022 in [facility] for Heart, Vascular, & Lung Health  Date 09/06/22  Educator Donnetta Simpers  [Handout]  Instruction Review Code 1- Verbalizes Understanding       Breathing Techniques Group instruction that is supported by demonstration and informational handouts. Instructor discusses the benefits of pursed lip and diaphragmatic breathing and detailed demonstration on how to perform both.  Flowsheet Row PULMONARY REHAB OTHER RESPIRATORY from 10/04/2022 in [facility] for Heart, Vascular, & Lung Health  Date 09/20/22  Educator EP  Instruction Review Code 1- Verbalizes Understanding        Risk Factor Reduction Group instruction that is supported by a PowerPoint presentation. Instructor discusses the definition of a risk factor, different risk factors for pulmonary disease, and how the heart and lungs work together. Flowsheet Row PULMONARY REHAB OTHER RESPIRATORY from 10/04/2022 in [facility] for Heart, Vascular, & [place]  Date 10/04/22  Educator EP  Instruction Review Code 1- Verbalizes Understanding       MD Day A group question and answer session with a medical doctor that allows participants to ask questions that relate to their pulmonary disease state.   Nutrition for the Pulmonary Patient Group instruction provided by PowerPoint slides, verbal discussion, and written materials to support subject matter. The instructor gives an explanation and review of healthy diet recommendations, which includes a discussion on weight management, recommendations for fruit and vegetable consumption, as well as protein, fluid, caffeine, fiber, sodium, sugar, and alcohol. Tips  for eating when patients are short of breath are discussed.    Other Education Group or individual verbal, written, or video instructions that support the educational goals of the pulmonary rehab program. Flowsheet Row PULMONARY REHAB OTHER RESPIRATORY from 10/04/2022 in [facility] for Heart, Vascular, & Lung Health  Date 09/27/22  Educator Donnetta Simpers  Instruction Review Code 1- Verbalizes Understanding        Knowledge Questionnaire Score:  Knowledge Questionnaire Score - 07/27/22 1110       Knowledge Questionnaire Score   Pre Score 18/18             Core Components/Risk Factors/Patient Goals at Admission:  Personal Goals and Risk Factors at Admission - 07/27/22 0925       Core Components/Risk Factors/Patient Goals on Admission   Improve shortness of breath with ADL's Yes    Intervention Provide education,  individualized exercise plan and daily activity instruction to help decrease symptoms of SOB with activities of daily living.    Expected Outcomes Short Term: Improve cardiorespiratory fitness to achieve a reduction of symptoms when performing ADLs;Long Term: Be able to perform more ADLs without symptoms or delay the onset of symptoms    Increase knowledge of respiratory medications and ability to use respiratory devices properly  Yes    Intervention Provide education and demonstration as needed of appropriate use of medications, inhalers, and oxygen therapy.    Expected Outcomes Short Term: Achieves understanding of medications use. Understands that oxygen is a medication prescribed by physician. Demonstrates appropriate use of inhaler and oxygen therapy.;Long Term: Maintain appropriate use of medications, inhalers, and oxygen therapy.             Core Components/Risk Factors/Patient Goals Review:   Goals and Risk Factor Review     Row Name 08/10/22 0645 09/05/22 0726 10/02/22 1610         Core Components/Risk Factors/Patient Goals Review    Personal Goals Review Improve shortness of breath with ADL's;Develop more efficient breathing techniques such as purse lipped breathing and diaphragmatic breathing and practicing self-pacing with activity.;Increase knowledge of respiratory medications and ability to use respiratory devices properly. Improve shortness of breath with ADL's;Develop more efficient breathing techniques such as purse lipped breathing and diaphragmatic breathing and practicing self-pacing with activity.;Increase knowledge of respiratory medications and ability to use respiratory devices properly. Improve shortness of breath with ADL's;Develop more efficient breathing techniques such as purse lipped breathing and diaphragmatic breathing and practicing self-pacing with activity.     Review Madden has completed 3 exercise sessions. She has increased her METs on the recumbent bike and Nustep. She feels that her conditioning level has improved since starting rehab. Norabelle has currently learned more about her disease. Will review medication later in the program. Will continue assess. Defne has completed 9 exercise sessions. She has increased her METs on the recumbent bike and Nustep. She feels that her conditioning level has improved since starting rehab. Gennette was given resources to learn more about her pulmonary disease. She feels this benefitted her. Will continue to assess. Kayde has completed 15 exercise sessions. She is enjoying working out on the recumbent bike and the NuStep while conversing with new friends in the class. She is increasing her workload and METs while maintaining her oxygenation on room air. Jmya has attended education class about pursed lip and diaphragmatic breathing.     Expected Outcomes See admission goals. See admission goals. See admission goals              Core Components/Risk Factors/Patient Goals at Discharge (Final Review):   Goals and Risk Factor Review - 10/02/22 1610       Core Components/Risk  Factors/Patient Goals Review   Personal Goals Review Improve shortness of breath with ADL's;Develop more efficient breathing techniques such as purse lipped breathing and diaphragmatic breathing and practicing self-pacing with activity.    Review Bradie has completed 15 exercise sessions. She is enjoying working out on the recumbent bike and the NuStep while conversing with new friends in the class. She is increasing her workload and METs while maintaining her oxygenation on room air. Suriya has attended education class about pursed lip and diaphragmatic breathing.    Expected Outcomes See admission goals             ITP Comments: Pt is making expected progress toward Pulmonary Rehab goals after completing 17 sessions. Recommend  continued exercise, life style modification, education, and utilization of breathing techniques to increase stamina and strength, while also decreasing shortness of breath with exertion.   Dr. Rodman Pickle is Medical Director for Pulmonary Rehab at [facility].

## 2022-10-11 ENCOUNTER — Encounter (HOSPITAL_COMMUNITY)
Admission: RE | Admit: 2022-10-11 | Discharge: 2022-10-11 | Disposition: A | Discharge status: Home / Self Care | Payer: PPO | Source: Ambulatory Visit | Attending: Pulmonary Disease | Admitting: Pulmonary Disease

## 2022-10-11 VITALS — Wt 129.0 lb

## 2022-10-11 DIAGNOSIS — J849 Interstitial pulmonary disease, unspecified: Secondary | ICD-10-CM | POA: Diagnosis not present

## 2022-10-11 DIAGNOSIS — R002 Palpitations: Secondary | ICD-10-CM

## 2022-10-11 NOTE — Telephone Encounter (Signed)
Called patient and notified of azathioprine appeal denial. Se verbalized understanding. She will continue using GoodRx to pay for medication  Knox Saliva, PharmD, MPH, BCPS, CPP Clinical Pharmacist (Rheumatology and Pulmonology)

## 2022-10-11 NOTE — Telephone Encounter (Signed)
Called patient and notified of azathioprine appeal denial due to non-FDA approved indication for medication. Patient verbalized understanding. She will continue using GoodRx to pay for medication  Knox Saliva, PharmD, MPH, BCPS, CPP Clinical Pharmacist (Rheumatology and Pulmonology)

## 2022-10-11 NOTE — Progress Notes (Addendum)
Error

## 2022-10-11 NOTE — Progress Notes (Signed)
Daily Session Note  Patient Details  Name: Susan Bray MRN: [id number] Date of Birth: [birth year] Referring Provider:   [name] Pulmonary Rehab Walk Test from 07/27/2022 in [facility] for Heart, Vascular, & [facility]  Referring Provider [name]       Encounter Date: 10/11/2022  Check In:  Session Check In - 10/11/22 1218       Check-In   Supervising physician immediately available to respond to emergencies [facility] - Physician supervision    Physician(s) Dr. Gala Murdoch    Location [facility]    Staff Present [name], [name], MS, ACSM-CEP, Exercise Physiologist;Other    Virtual Visit No    Medication changes reported     No    Fall or balance concerns reported    No    Tobacco Cessation No Change    Warm-up and Cool-down Performed as group-led instruction    Resistance Training Performed Yes    VAD Patient? No    PAD/SET Patient? No      Pain Assessment   Currently in Pain? No/denies    Multiple Pain Sites No             Capillary Blood Glucose: No results found for this or any previous visit (from the past 24 hour(s)).    Social History   Tobacco Use  Smoking Status Never  Smokeless Tobacco Never    Goals Met:  Independence with exercise equipment Exercise tolerated well No report of concerns or symptoms today Strength training completed today  Goals Unmet:  Not Applicable  Comments: Service time is from 1018 to 1146    Dr. Rodman Pickle is Medical Director for Pulmonary Rehab at [facility].

## 2022-10-15 DIAGNOSIS — N390 Urinary tract infection, site not specified: Secondary | ICD-10-CM | POA: Diagnosis not present

## 2022-10-15 DIAGNOSIS — R110 Nausea: Secondary | ICD-10-CM | POA: Diagnosis not present

## 2022-10-16 ENCOUNTER — Encounter (HOSPITAL_COMMUNITY): Payer: PPO

## 2022-10-23 ENCOUNTER — Encounter (HOSPITAL_COMMUNITY): Payer: PPO

## 2022-10-25 ENCOUNTER — Encounter: Payer: Self-pay | Admitting: Gastroenterology

## 2022-10-25 ENCOUNTER — Encounter (HOSPITAL_COMMUNITY)
Admission: RE | Admit: 2022-10-25 | Discharge: 2022-10-25 | Disposition: A | Discharge status: Home / Self Care | Payer: PPO | Source: Ambulatory Visit | Attending: Pulmonary Disease | Admitting: Pulmonary Disease

## 2022-10-25 ENCOUNTER — Ambulatory Visit: Payer: PPO | Admitting: Gastroenterology

## 2022-10-25 VITALS — BP 120/68 | HR 79 | Ht 63.0 in | Wt 127.0 lb

## 2022-10-25 DIAGNOSIS — M3390 Dermatopolymyositis, unspecified, organ involvement unspecified: Secondary | ICD-10-CM

## 2022-10-25 DIAGNOSIS — Z5320 Procedure and treatment not carried out because of patient's decision for unspecified reasons: Secondary | ICD-10-CM

## 2022-10-25 DIAGNOSIS — K5792 Diverticulitis of intestine, part unspecified, without perforation or abscess without bleeding: Secondary | ICD-10-CM

## 2022-10-25 DIAGNOSIS — J849 Interstitial pulmonary disease, unspecified: Secondary | ICD-10-CM | POA: Diagnosis not present

## 2022-10-25 MED ORDER — DICYCLOMINE HCL 20 MG PO TABS: 20.0000 mg | ORAL_TABLET | Freq: Four times a day (QID) | ORAL | 3 refills | Status: AC | PRN

## 2022-10-25 NOTE — Progress Notes (Signed)
Daily Session Note  Patient Details  Name: Susan Bray MRN: [id number] Date of Birth: February 08, 1946 Referring Provider:   [name] Pulmonary Rehab Walk Test from 07/27/2022 in [facility] for Heart, Vascular, & [place]  Referring Provider [name]       Encounter Date: 10/25/2022  Check In:  Session Check In - 10/25/22 1019       Check-In   Supervising physician immediately available to respond to emergencies [facility] - Physician supervision    Physician(s) Malen Gauze    Location [facility]    Staff Present [name], RN;Randi Yevonne Pax, ACSM-CEP, Exercise Physiologist; Margette Fast, RN, Quentin Ore, MS, ACSM-CEP, Exercise Physiologist    Virtual Visit No    Medication changes reported     No    Fall or balance concerns reported    No    Tobacco Cessation No Change    Warm-up and Cool-down Performed as group-led instruction    Resistance Training Performed Yes    VAD Patient? No    PAD/SET Patient? No      Pain Assessment   Currently in Pain? No/denies             Capillary Blood Glucose: No results found for this or any previous visit (from the past 24 hour(s)).    Social History   Tobacco Use  Smoking Status Never  Smokeless Tobacco Never    Goals Met:  Independence with exercise equipment Exercise tolerated well No report of concerns or symptoms today Strength training completed today  Goals Unmet:  Not Applicable  Comments: Service time is from 1002 to [place]    Dr. Rodman Pickle is Medical Director for Pulmonary Rehab at [facility].

## 2022-10-25 NOTE — Patient Instructions (Addendum)
_______________________________________________________  If you are age 75 or older, your body mass index should be between 23-30. Your Body mass index is 22.5 kg/m. If this is out of the aforementioned range listed, please consider follow up with your Primary Care Provider.  If you are age 29 or younger, your body mass index should be between 19-25. Your Body mass index is 22.5 kg/m. If this is out of the aformentioned range listed, please consider follow up with your Primary Care Provider.   Start IB guard samples .  Start Metamucil capsules daily.  The [place] [facility] providers would like to encourage you to use [facility] to communicate with providers for non-urgent requests or questions.  Due to long hold times on the telephone, sending your provider a message by [facility] may be a faster and more efficient way to get a response.  Please allow 48 business hours for a response.  Please remember that this is for non-urgent requests.   It was a pleasure to see you today!  Thank you for trusting me with your gastrointestinal care!

## 2022-10-25 NOTE — Progress Notes (Signed)
HPI : Susan Bray is a very pleasant 77 year old female with a history of Sjogren syndrome/dermatomyositis with interstitial lung disease, coronary artery disease and atrial fibrillation who is referred to [place] by Dr. Collene Leyden for further evaluation of CT findings suggestive of mild/resolving diverticulitis.  CT scan was performed in June of this year and showed faint fat stranding along the junction of the sigmoid and descending colon.  CT was reportedly obtained to evaluate chronic fatigue. She tells me she had a CT scan in 2016 which showed similar findings, but I was not able to find a prior CT of the abdomen/pelvis. She does report having episodes of left lower quadrant abdominal pain over the years.  These episodes can be severe, but typically only last 1-3 days.  Typically she will just not eat much and use a heating pad on her abdomen and the symptoms improve.  She denies nausea/vomiting, fevers or chills.  She says her last episode was several months ago, but she doesn't remember if it was around the time of the CT.  She denies taking antibiotics for these episodes. In between these episodes she does have milder gas-like discomfort which comes and goes.  She has regular bowel movements and denies problems with constipation or diarrhea.  No blood in her stool.  No family history of colon cancer.  She has never had a colonoscopy.  She had a Cologuard in February of this year.  She states that she is not interested in undergoing a colonoscopy because she has friends who experienced a perforation from colonoscopies and she has 'too much other stuff going on right now"    CT abdomen pelvis without contrast April 20, 2022 IMPRESSION: 1. Interstitial lung disease with only mild progression since 2016, predominating in the lower zones. This does not fit the typical pattern of UIP and with peripheral areas of sparing may suggest NSIP. 2. No acute noncontrast chest CT abnormality. There is aortic  and coronary artery atherosclerosis. 3. In the abdomen there is diffuse colonic diverticulitis greatest along the left-sided colon with trace pericolic stranding at the junction of the descending and sigmoid colon. This could be due to mild acute diverticulitis or fat scarring from old diverticular disease. Please correlate clinically. There is no evidence of free air, reactive free fluid or abscess. 4. Nonobstructive micronephrolithiasis. 5. Osteopenia, scoliosis and degenerative change. 6. Old granulomatous disease. 7. Nonspecific small size of thyroid gland. 8. Left sacral and left pubic rami fractures with interval healing since 2021. 9. These results will be called to the ordering physician or representative by the radiologist assistant, and communication documented in the PACS or [facility] dashboard  Past Medical History:  Diagnosis Date   Atrial fibrillation ([place])    CAD (coronary artery disease)    Cataract    Diverticulitis    Hyperlipidemia    IBS (irritable bowel syndrome)    Thyroid disease    VIN III (vulvar intraepithelial neoplasia III)      Past Surgical History:  Procedure Laterality Date   ABDOMINAL HYSTERECTOMY  1978   CARDIAC CATHETERIZATION N/A 12/06/2015   Procedure: Left Heart Cath and Coronary Angiography;  Surgeon: Belva Crome, MD;  Location: [facility];  Service: Cardiovascular;  Laterality: N/A;   CATARACT EXTRACTION  08/17/04 ; 09/12/2004   left eye and right eye   CATARACT EXTRACTION     laser vaporization of hg vaginal dysplasia  12/2006   p & e repairs with cardinal uterosatral  coloposusension (pelvic area repair)  03/03/09   severe dysplasia near rectum  03/18/2013   tummy tuck  1982   upper colpectomy  09/1993   Family History  Problem Relation Age of Onset   Arthritis Mother    Cancer Mother        breast   Hypertension Mother    Heart attack Mother 85   Breast cancer Mother 63   Heart attack Father 18   Peripheral vascular  disease Sister 27   CAD Brother 78   Heart disease Maternal Grandmother    Colon cancer Neg Hx    Stomach cancer Neg Hx    Esophageal cancer Neg Hx    Colonic polyp Neg Hx    Social History   Tobacco Use   Smoking status: Never   Smokeless tobacco: Never  Vaping Use   Vaping Use: Never used  Substance Use Topics   Alcohol use: No    Alcohol/week: 0.0 standard drinks of alcohol   Drug use: No   Current Outpatient Medications  Medication Sig Dispense Refill   azaTHIOprine (IMURAN) 50 MG tablet Take 3 tablets (150 mg total) by mouth daily. 90 tablet 5   clobetasol ointment (TEMOVATE) 0.05 % Apply topically 2 (two) times daily.     ezetimibe (ZETIA) 10 MG tablet Take 10 mg by mouth daily.     fenofibrate micronized (LOFIBRA) 134 MG capsule Take 134 mg by mouth daily before breakfast.     predniSONE (DELTASONE) 10 MG tablet Take 5 mg by mouth daily.     SYNTHROID 50 MCG tablet Take by mouth.     apixaban (ELIQUIS) 5 MG TABS tablet Take 1 tablet (5 mg total) by mouth 2 (two) times daily. (Patient not taking: Reported on 10/25/2022) 180 tablet 1   diltiazem (CARDIZEM CD) 120 MG 24 hr capsule Take 1 capsule (120 mg total) by mouth daily. (Patient not taking: Reported on 10/25/2022) 90 capsule 3   No current facility-administered medications for this visit.   Allergies  Allergen Reactions   Colesevelam Other (See Comments)    Muscle and nerve pain/ damage in her arm    Statins Other (See Comments)    Muscle aches and fatigue.      Review of Systems: All systems reviewed and negative except where noted in HPI.    No results found.  Physical Exam: BP 120/68   Pulse 79   Ht '5\' 3"'$  (1.6 m)   Wt 127 lb (57.6 kg)   SpO2 97%   BMI 22.50 kg/m  Constitutional: Pleasant,well-developed, Caucasian female in no acute distress. HEENT: Normocephalic and atraumatic. Conjunctivae are normal. No scleral icterus. Neck supple.  Cardiovascular: Normal rate, regular rhythm.  Pulmonary/chest:  Effort normal and breath sounds normal. No wheezing, rales or rhonchi. Abdominal: Soft, nondistended, nontender. Bowel sounds active throughout. There are no masses palpable. No hepatomegaly. Extremities: no edema Neurological: Alert and oriented to person place and time. Skin: Skin is warm and dry. No rashes noted. Psychiatric: Normal mood and affect. Behavior is normal.  CBC    Component Value Date/Time   WBC 5.2 08/13/2022 0949   RBC 4.62 08/13/2022 0949   HGB 13.6 08/13/2022 0949   HGB 12.2 08/13/2017 1623   HCT 41.9 08/13/2022 0949   HCT 38.4 08/13/2017 1623   PLT 283.0 08/13/2022 0949   PLT 243 08/13/2017 1623   MCV 90.7 08/13/2022 0949   MCV 87 08/13/2017 1623   MCH 27.5 08/13/2017 1623   MCH 27.2 12/05/2015  0836   MCHC 32.5 08/13/2022 0949   RDW 15.0 08/13/2022 0949   RDW 15.1 08/13/2017 1623   LYMPHSABS 1.6 08/13/2022 0949   LYMPHSABS 2.1 08/13/2017 1623   MONOABS 0.5 08/13/2022 0949   EOSABS 0.2 08/13/2022 0949   EOSABS 0.3 08/13/2017 1623   BASOSABS 0.0 08/13/2022 0949   BASOSABS 0.1 08/13/2017 1623    CMP     Component Value Date/Time   NA 139 08/13/2022 0949   NA 142 08/13/2017 1623   K 3.5 08/13/2022 0949   CL 101 08/13/2022 0949   CO2 33 (H) 08/13/2022 0949   GLUCOSE 85 08/13/2022 0949   BUN 13 08/13/2022 0949   BUN 11 08/13/2017 1623   CREATININE 1.03 08/13/2022 0949   CREATININE 0.81 12/05/2015 0836   CALCIUM 10.0 08/13/2022 0949   PROT 7.2 08/13/2022 0949   PROT 7.2 08/13/2017 1623   ALBUMIN 4.0 08/13/2022 0949   ALBUMIN 4.3 08/13/2017 1623   AST 20 08/13/2022 0949   ALT 12 08/13/2022 0949   ALKPHOS 44 08/13/2022 0949   BILITOT 0.6 08/13/2022 0949   BILITOT 0.3 08/13/2017 1623   GFRNONAA 80 08/13/2017 1623   GFRAA 93 08/13/2017 1623     ASSESSMENT AND PLAN: 77 year old female with dermatomyositis, interstitial lung disease, CAD and A-fib with CT in June 2023 with findings suggestive of mild left sided diverticulitis.  She has a clinical  history that may be consistent with recurrent bouts of mild diverticulitis as well.  She has never had a colonoscopy.  I recommended a colonoscopy to the patient because diverticular findings on CT scan can sometimes be mistaken for neoplasm.  In addition, dermatomyositis is associated with a higher risk of malignancy and colonoscopy is recommended for that reason as well.  The negative Cologuard earlier this year is certainly reassuring, but with these other risk factors, I strongly recommended a colonoscopy. The patient however could not be convinced to proceed with a colonoscopy.  I tried to reassure her that complications from colonoscopies are indeed possible, but are extremely rare.  Nevertheless, the patient declined to undergo colonoscopy. Therefore, we proceeded to discuss the presumptive diagnosis of recurrent uncomplicated diverticulitis.  I recommended that the patient start taking Metamucil on a daily basis and follow a high-fiber diet.  Will try Bentyl as needed for her episodes of abdominal pain.  I instructed the patient to contact me if she has another episode of severe abdominal pain that does not quickly resolve, and we would order a CT scan to reevaluate for diverticulitis or prescribe empiric antibiotics.   Suspected uncomplicated diverticulitis, resolved - Daily Metamucil - Bentyl PRN - Colonoscopy declined by patient  Dermatomyositis/colon cancer screening - Colonoscopy declined   E. Candis Schatz, MD [facility]   CC:  Collene Leyden, MD

## 2022-10-26 DIAGNOSIS — E039 Hypothyroidism, unspecified: Secondary | ICD-10-CM | POA: Diagnosis not present

## 2022-10-26 DIAGNOSIS — E7800 Pure hypercholesterolemia, unspecified: Secondary | ICD-10-CM | POA: Diagnosis not present

## 2022-10-30 ENCOUNTER — Encounter (HOSPITAL_COMMUNITY): Payer: PPO

## 2022-11-01 ENCOUNTER — Encounter (HOSPITAL_COMMUNITY)
Admission: RE | Admit: 2022-11-01 | Discharge: 2022-11-01 | Disposition: A | Discharge status: Home / Self Care | Payer: PPO | Source: Ambulatory Visit | Attending: Pulmonary Disease | Admitting: Pulmonary Disease

## 2022-11-01 DIAGNOSIS — J849 Interstitial pulmonary disease, unspecified: Secondary | ICD-10-CM

## 2022-11-01 NOTE — Progress Notes (Signed)
Daily Session Note  Patient Details  Name: ARIONNA HOGGARD MRN: [id number] Date of Birth: 25-Nov-1945 Referring Provider:   [name] Pulmonary Rehab Walk Test from 07/27/2022 in [facility] for Heart, Vascular, & [place]  Referring Provider [name]       Encounter Date: 11/01/2022  Check In:  Session Check In - 11/01/22 1122       Check-In   Supervising physician immediately available to respond to emergencies [facility] MD immediately available    Physician(s) Marge Duncans, NP    Location [facility]    Staff Present Janine Ores, RN, BSN;Randi Olen Cordial BS, ACSM-CEP, Exercise Physiologist; Rosana Hoes, MS, ACSM-CEP, Exercise Physiologist;Bailey Pearline Cables, MS, Exercise Physiologist    Virtual Visit No    Medication changes reported     No    Tobacco Cessation No Change    Warm-up and Cool-down Performed as group-led instruction    Resistance Training Performed Yes    VAD Patient? No    PAD/SET Patient? No      Pain Assessment   Currently in Pain? No/denies             Capillary Blood Glucose: No results found for this or any previous visit (from the past 24 hour(s)).    Social History   Tobacco Use  Smoking Status Never  Smokeless Tobacco Never    Goals Met:  Proper associated with RPD/PD & O2 Sat Exercise tolerated well No report of concerns or symptoms today Strength training completed today  Goals Unmet:  Not Applicable  Comments: Service time is from 1014 to 1132.    Dr. Rodman Pickle is Medical Director for Pulmonary Rehab at [facility]

## 2022-11-06 ENCOUNTER — Encounter (HOSPITAL_COMMUNITY)
Admission: RE | Admit: 2022-11-06 | Discharge: 2022-11-06 | Disposition: A | Discharge status: Home / Self Care | Payer: PPO | Source: Ambulatory Visit | Attending: Pulmonary Disease | Admitting: Pulmonary Disease

## 2022-11-06 VITALS — Wt 125.9 lb

## 2022-11-06 DIAGNOSIS — J849 Interstitial pulmonary disease, unspecified: Secondary | ICD-10-CM

## 2022-11-06 NOTE — Progress Notes (Signed)
Daily Session Note  Patient Details  Name: Susan Bray MRN: [id number] Date of Birth: [birth year] Referring Provider:   [name] Pulmonary Rehab Walk Test from 07/27/2022 in [facility] for Heart, Vascular, & [place]  Referring Provider [name]       Encounter Date: 11/06/2022  Check In:  Session Check In - 11/06/22 1127       Check-In   Supervising physician immediately available to respond to emergencies [facility] MD immediately available    Physician(s) Eric Form, NP    Location [facility]    Staff Present Janine Ores, RN, BSN; Olen Cordial BS, ACSM-CEP, Exercise Physiologist;Kaylee Rosana Hoes, MS, ACSM-CEP, Exercise Physiologist;Other;Samantha [place], RD, LDN    Virtual Visit No    Medication changes reported     No    Fall or balance concerns reported    No    Tobacco Cessation No Change    Warm-up and Cool-down Performed as group-led instruction    Resistance Training Performed Yes    VAD Patient? No    PAD/SET Patient? No      Pain Assessment   Currently in Pain? No/denies             Capillary Blood Glucose: No results found for this or any previous visit (from the past 24 hour(s)).   Exercise Prescription Changes - 11/06/22 1200       Response to Exercise   Blood Pressure (Admit) 108/66    Blood Pressure (Exercise) 140/60    Blood Pressure (Exit) 102/48    Heart Rate (Admit) 75 bpm    Heart Rate (Exercise) 100 bpm    Heart Rate (Exit) 83 bpm    Oxygen Saturation (Admit) 100 %    Oxygen Saturation (Exercise) 100 %    Oxygen Saturation (Exit) 95 %    Rating of Perceived Exertion (Exercise) 11    Perceived Dyspnea (Exercise) 2    Duration Continue with 30 min of aerobic exercise without signs/symptoms of physical distress.    Intensity THRR unchanged      Progression   Progression Continue to progress workloads to maintain intensity without signs/symptoms of physical distress.    Average METs 4.5       Resistance Training   Training Prescription Yes    Weight blue bands    Reps 10-15    Time 10 Minutes      Recumbant Bike   Level 5.5    Minutes 15    METs 4.7      NuStep   Level 6    SPM 70    Minutes 15    METs 4.3             Social History   Tobacco Use  Smoking Status Never  Smokeless Tobacco Never    Goals Met:  Exercise tolerated well No report of concerns or symptoms today Strength training completed today  Goals Unmet:  Not Applicable  Comments: Service time is from 1013 to 1144.    Dr. Rodman Pickle is Medical Director for Pulmonary Rehab at [facility].

## 2022-11-07 NOTE — Progress Notes (Signed)
Pulmonary Individual Treatment Plan  Patient Details  Name: GUELDA STANKUS MRN: [id number] Date of Birth: [birth year] Referring Provider:   [name] Pulmonary Rehab Walk Test from 07/27/2022 in [facility] [place]  Referring Provider [name]       Initial Encounter Date:  Flowsheet Row Pulmonary Rehab Walk Test from 07/27/2022 in [facility] [place]  Date 07/27/22       Visit Diagnosis: ILD (interstitial lung disease) ([place])  Patient's Home Medications on Admission:   Current Outpatient Medications:    apixaban (ELIQUIS) 5 MG TABS tablet, Take 1 tablet (5 mg total) by mouth 2 (two) times daily. (Patient not taking: Reported on 10/25/2022), Disp: 180 tablet, Rfl: 1   azaTHIOprine (IMURAN) 50 MG tablet, Take 3 tablets (150 mg total) by mouth daily., Disp: 90 tablet, Rfl: 5   clobetasol ointment (TEMOVATE) 0.05 %, Apply topically 2 (two) times daily., Disp: , Rfl:    dicyclomine (BENTYL) 20 MG tablet, Take 1 tablet (20 mg total) by mouth every 6 (six) hours as needed for spasms., Disp: 30 tablet, Rfl: 3   diltiazem (CARDIZEM CD) 120 MG 24 hr capsule, Take 1 capsule (120 mg total) by mouth daily. (Patient not taking: Reported on 10/25/2022), Disp: 90 capsule, Rfl: 3   ezetimibe (ZETIA) 10 MG tablet, Take 10 mg by mouth daily., Disp: , Rfl:    fenofibrate micronized (LOFIBRA) 134 MG capsule, Take 134 mg by mouth daily before breakfast., Disp: , Rfl:    predniSONE (DELTASONE) 10 MG tablet, Take 5 mg by mouth daily., Disp: , Rfl:    SYNTHROID 50 MCG tablet, Take by mouth., Disp: , Rfl:   Past Medical History: Past Medical History:  Diagnosis Date   Atrial fibrillation ([place])    CAD (coronary artery disease)    Cataract    Diverticulitis    Hyperlipidemia    IBS (irritable bowel syndrome)    Thyroid disease    VIN III (vulvar intraepithelial neoplasia III)     Tobacco Use: Social  History   Tobacco Use  Smoking Status Never  Smokeless Tobacco Never    Labs: Review Flowsheet       Latest Ref Rng & Units 09/08/2014 11/15/2015  Labs for ITP Cardiac and Pulmonary Rehab  Cholestrol 125 - 200 mg/dL 180  149   LDL (calc) <130 mg/dL 113  79   HDL-C >=46 mg/dL 46.20  47   Trlycerides <150 mg/dL 106.0  117     Capillary Blood Glucose: No results found for: "GLUCAP"   Pulmonary Assessment Scores:  Pulmonary Assessment Scores     Row Name 07/27/22 1102 11/07/22 0644 11/07/22 0650     ADL UCSD   ADL Phase Entry Exit --   SOB Score total 53 59 --     CAT Score   CAT Score 14 15 --     mMRC Score   mMRC Score 3 -- --           UCSD: Self-administered rating of dyspnea associated with activities of daily living (ADLs) 6-point scale (0 = "not at all" to 5 = "maximal or unable to do because of breathlessness")  Scoring Scores range from 0 to 120.  Minimally important difference is 5 units  CAT: CAT can identify the health impairment of COPD patients and is better correlated with disease progression.  CAT has a scoring range of zero to 40. The CAT  score is classified into four groups of low (less than 10), medium (10 - 20), high (21-30) and very high (31-40) based on the impact level of disease on health status. A CAT score over 10 suggests significant symptoms.  A worsening CAT score could be explained by an exacerbation, poor medication adherence, poor inhaler technique, or progression of COPD or comorbid conditions.  CAT MCID is 2 points  mMRC: mMRC (Modified Medical Research Council) Dyspnea Scale is used to assess the degree of baseline functional disability in patients of respiratory disease due to dyspnea. No minimal important difference is established. A decrease in score of 1 point or greater is considered a positive change.   Pulmonary Function Assessment:  Pulmonary Function Assessment - 07/27/22 1102       Breath   Bilateral Breath Sounds  Clear    Shortness of Breath Yes;Fear of Shortness of Breath;Limiting activity             Exercise Target Goals: Exercise Program Goal: Individual exercise prescription set using results from initial 6 min walk test and THRR while considering  patient's activity barriers and safety.   Exercise Prescription Goal: Initial exercise prescription builds to 30-45 minutes a day of aerobic activity, 2-3 days per week.  Home exercise guidelines will be given to patient during program as part of exercise prescription that the participant will acknowledge.  Activity Barriers & Risk Stratification:  Activity Barriers & Cardiac Risk Stratification - 07/27/22 0925       Activity Barriers & Cardiac Risk Stratification   Activity Barriers Deconditioning;Muscular Weakness;Shortness of Breath;History of Falls    Cardiac Risk Stratification Moderate             6 Minute Walk:  6 Minute Walk     Row Name 07/27/22 1105         6 Minute Walk   Phase Initial     Distance 1005 feet     Walk Time 6 minutes     # of Rest Breaks 0     MPH 3.07     METS 3.35     RPE 13     Perceived Dyspnea  3     VO2 Peak 11.71     Symptoms No     Resting HR 69 bpm     Resting BP 120/60     Resting Oxygen Saturation  97 %     Exercise Oxygen Saturation  during 6 min walk 93 %     Max Ex. HR 80 bpm     Max Ex. BP 138/60     2 Minute Post BP 124/60       Interval HR   1 Minute HR 77     2 Minute HR 80     3 Minute HR 79     4 Minute HR 76     5 Minute HR 80     6 Minute HR 78     2 Minute Post HR 64     Interval Heart Rate? Yes       Interval Oxygen   Interval Oxygen? Yes     Baseline Oxygen Saturation % 97 %     1 Minute Oxygen Saturation % 97 %     1 Minute Liters of Oxygen 0 L     2 Minute Oxygen Saturation % 99 %     2 Minute Liters of Oxygen 0 L     3 Minute Oxygen Saturation % 93 %  3 Minute Liters of Oxygen 0 L     4 Minute Oxygen Saturation % 98 %     4 Minute Liters of  Oxygen 0 L     5 Minute Oxygen Saturation % 96 %     5 Minute Liters of Oxygen 0 L     6 Minute Oxygen Saturation % 97 %     6 Minute Liters of Oxygen 0 L     2 Minute Post Oxygen Saturation % 100 %     2 Minute Post Liters of Oxygen 64 L              Oxygen Initial Assessment:  Oxygen Initial Assessment - 07/27/22 0921       Home Oxygen   Home Oxygen Device None    Sleep Oxygen Prescription None    Home Exercise Oxygen Prescription None    Home Resting Oxygen Prescription None      Initial 6 min Walk   Oxygen Used None      Program Oxygen Prescription   Program Oxygen Prescription None      Intervention   Short Term Goals To learn and exhibit compliance with exercise, home and travel O2 prescription;To learn and understand importance of monitoring SPO2 with pulse oximeter and demonstrate accurate use of the pulse oximeter.;To learn and understand importance of maintaining oxygen saturations>88%;To learn and demonstrate proper pursed lip breathing techniques or other breathing techniques. ;To learn and demonstrate proper use of respiratory medications    Long  Term Goals Exhibits compliance with exercise, home  and travel O2 prescription;Verbalizes importance of monitoring SPO2 with pulse oximeter and return demonstration;Maintenance of O2 saturations>88%;Exhibits proper breathing techniques, such as pursed lip breathing or other method taught during program session;Compliance with respiratory medication;Demonstrates proper use of MDI's             Oxygen Re-Evaluation:  Oxygen Re-Evaluation     Row Name 08/06/22 1201 09/05/22 0725 10/03/22 0732 11/06/22 0924       Program Oxygen Prescription   Program Oxygen Prescription None None None None      Home Oxygen   Home Oxygen Device None None None None    Sleep Oxygen Prescription None None None None    Home Exercise Oxygen Prescription None None None None    Home Resting Oxygen Prescription None None None None       Goals/Expected Outcomes   Short Term Goals To learn and exhibit compliance with exercise, home and travel O2 prescription;To learn and understand importance of monitoring SPO2 with pulse oximeter and demonstrate accurate use of the pulse oximeter.;To learn and understand importance of maintaining oxygen saturations>88%;To learn and demonstrate proper pursed lip breathing techniques or other breathing techniques. ;To learn and demonstrate proper use of respiratory medications To learn and exhibit compliance with exercise, home and travel O2 prescription;To learn and understand importance of monitoring SPO2 with pulse oximeter and demonstrate accurate use of the pulse oximeter.;To learn and understand importance of maintaining oxygen saturations>88%;To learn and demonstrate proper pursed lip breathing techniques or other breathing techniques. ;To learn and demonstrate proper use of respiratory medications To learn and exhibit compliance with exercise, home and travel O2 prescription;To learn and understand importance of monitoring SPO2 with pulse oximeter and demonstrate accurate use of the pulse oximeter.;To learn and understand importance of maintaining oxygen saturations>88%;To learn and demonstrate proper pursed lip breathing techniques or other breathing techniques. ;To learn and demonstrate proper use of respiratory medications To learn and exhibit compliance  with exercise, home and travel O2 prescription;To learn and understand importance of monitoring SPO2 with pulse oximeter and demonstrate accurate use of the pulse oximeter.;To learn and understand importance of maintaining oxygen saturations>88%;To learn and demonstrate proper pursed lip breathing techniques or other breathing techniques. ;To learn and demonstrate proper use of respiratory medications    Long  Term Goals Exhibits compliance with exercise, home  and travel O2 prescription;Verbalizes importance of monitoring SPO2 with pulse oximeter and  return demonstration;Maintenance of O2 saturations>88%;Exhibits proper breathing techniques, such as pursed lip breathing or other method taught during program session;Compliance with respiratory medication;Demonstrates proper use of MDI's Exhibits compliance with exercise, home  and travel O2 prescription;Verbalizes importance of monitoring SPO2 with pulse oximeter and return demonstration;Maintenance of O2 saturations>88%;Exhibits proper breathing techniques, such as pursed lip breathing or other method taught during program session;Compliance with respiratory medication;Demonstrates proper use of MDI's Exhibits compliance with exercise, home  and travel O2 prescription;Verbalizes importance of monitoring SPO2 with pulse oximeter and return demonstration;Maintenance of O2 saturations>88%;Exhibits proper breathing techniques, such as pursed lip breathing or other method taught during program session;Compliance with respiratory medication;Demonstrates proper use of MDI's Exhibits compliance with exercise, home  and travel O2 prescription;Verbalizes importance of monitoring SPO2 with pulse oximeter and return demonstration;Maintenance of O2 saturations>88%;Exhibits proper breathing techniques, such as pursed lip breathing or other method taught during program session;Compliance with respiratory medication;Demonstrates proper use of MDI's    Goals/Expected Outcomes Compliance and understanding of oxygen saturation monitoring and breathing techniques to decrease shortness of breath. Compliance and understanding of oxygen saturation monitoring and breathing techniques to decrease shortness of breath. Compliance and understanding of oxygen saturation monitoring and breathing techniques to decrease shortness of breath. Compliance and understanding of oxygen saturation monitoring and breathing techniques to decrease shortness of breath.             Oxygen Discharge (Final Oxygen Re-Evaluation):  Oxygen  Re-Evaluation - 11/06/22 0924       Program Oxygen Prescription   Program Oxygen Prescription None      Home Oxygen   Home Oxygen Device None    Sleep Oxygen Prescription None    Home Exercise Oxygen Prescription None    Home Resting Oxygen Prescription None      Goals/Expected Outcomes   Short Term Goals To learn and exhibit compliance with exercise, home and travel O2 prescription;To learn and understand importance of monitoring SPO2 with pulse oximeter and demonstrate accurate use of the pulse oximeter.;To learn and understand importance of maintaining oxygen saturations>88%;To learn and demonstrate proper pursed lip breathing techniques or other breathing techniques. ;To learn and demonstrate proper use of respiratory medications    Long  Term Goals Exhibits compliance with exercise, home  and travel O2 prescription;Verbalizes importance of monitoring SPO2 with pulse oximeter and return demonstration;Maintenance of O2 saturations>88%;Exhibits proper breathing techniques, such as pursed lip breathing or other method taught during program session;Compliance with respiratory medication;Demonstrates proper use of MDI's    Goals/Expected Outcomes Compliance and understanding of oxygen saturation monitoring and breathing techniques to decrease shortness of breath.             Initial Exercise Prescription:  Initial Exercise Prescription - 07/27/22 1100       Date of Initial Exercise RX and Referring Provider   Date 07/27/22    Referring Provider [name]    Expected Discharge Date 10/04/22      Recumbant Bike   Level 1    Minutes 15    METs 2  NuStep   Level 1    SPM 60    Minutes 15    METs 1.8      Prescription Details   Frequency (times per week) 2    Duration Progress to 30 minutes of continuous aerobic without signs/symptoms of physical distress      Intensity   THRR 40-80% of Max Heartrate 58-117    Ratings of Perceived Exertion 11-13    Perceived Dyspnea 0-4       Progression   Progression Continue progressive overload as per policy without signs/symptoms or physical distress.      Resistance Training   Training Prescription Yes    Weight red bands    Reps 10-15             Perform Capillary Blood Glucose checks as needed.  Exercise Prescription Changes:   Exercise Prescription Changes     Row Name 08/07/22 1200 08/16/22 1200 08/28/22 1200 09/06/22 1637 09/25/22 1200     Response to Exercise   Blood Pressure (Admit) 118/62 114/54 114/62 96/40 112/52   Blood Pressure (Exercise) 130/60 132/60 110/58 -- 126/56   Blood Pressure (Exit) 106/60 104/62 120/66 108/54 110/60   Heart Rate (Admit) 60 bpm 64 bpm 95 bpm 59 bpm 62 bpm   Heart Rate (Exercise) 75 bpm 81 bpm 73 bpm 104 bpm 84 bpm   Heart Rate (Exit) 59 bpm 64 bpm 63 bpm 61 bpm 68 bpm   Oxygen Saturation (Admit) 99 % 100 % 98 % 98 % 98 %   Oxygen Saturation (Exercise) 97 % 100 % 96 % 97 % 98 %   Oxygen Saturation (Exit) 98 % 99 % 97 % 97 % 97 %   Rating of Perceived Exertion (Exercise) '13 13 13 10 13   '$ Perceived Dyspnea (Exercise) '1 1 1 1 2   '$ Duration Progress to 30 minutes of  aerobic without signs/symptoms of physical distress Continue with 30 min of aerobic exercise without signs/symptoms of physical distress. Continue with 30 min of aerobic exercise without signs/symptoms of physical distress. Continue with 30 min of aerobic exercise without signs/symptoms of physical distress. Continue with 30 min of aerobic exercise without signs/symptoms of physical distress.   Intensity Other (comment)  40-80% of HRR THRR unchanged THRR unchanged THRR unchanged THRR unchanged     Progression   Progression Continue to progress workloads to maintain intensity without signs/symptoms of physical distress. Continue to progress workloads to maintain intensity without signs/symptoms of physical distress. Continue to progress workloads to maintain intensity without signs/symptoms of physical distress.  Continue to progress workloads to maintain intensity without signs/symptoms of physical distress. Continue to progress workloads to maintain intensity without signs/symptoms of physical distress.     Resistance Training   Training Prescription Yes Yes Yes Yes Yes   Weight Red bands Red bands Red bands Red bands Red bands   Reps 10-15 10-15 10-15 10-15 10-15   Time 10 Minutes 10 Minutes 10 Minutes 10 Minutes 10 Minutes     Interval Training   Interval Training -- No -- -- --     Recumbant Bike   Level '1 2 2 3 4   '$ Minutes '15 15 15 15 15   '$ METs 1.8 2 3.3 3.7 4.9     NuStep   Level '2 3 3 4 5   '$ SPM 70 70 70 70 70   Minutes '15 15 15 15 15   '$ METs 2 2.2 2.4 2.2 2.3    Row Name  10/23/22 1100 11/06/22 1200           Response to Exercise   Blood Pressure (Admit) 104/50 108/66      Blood Pressure (Exercise) -- 140/60      Blood Pressure (Exit) 112/58 102/48      Heart Rate (Admit) 66 bpm 75 bpm      Heart Rate (Exercise) 84 bpm 100 bpm      Heart Rate (Exit) 74 bpm 83 bpm      Oxygen Saturation (Admit) 98 % 100 %      Oxygen Saturation (Exercise) 98 % 100 %      Oxygen Saturation (Exit) 96 % 95 %      Rating of Perceived Exertion (Exercise) 11 11      Perceived Dyspnea (Exercise) 1 2      Duration Continue with 30 min of aerobic exercise without signs/symptoms of physical distress. Continue with 30 min of aerobic exercise without signs/symptoms of physical distress.      Intensity THRR unchanged THRR unchanged        Progression   Progression Continue to progress workloads to maintain intensity without signs/symptoms of physical distress. Continue to progress workloads to maintain intensity without signs/symptoms of physical distress.      Average METs 4.4 4.5        Resistance Training   Training Prescription Yes Yes      Weight Red bands blue bands      Reps 10-15 10-15      Time 10 Minutes 10 Minutes        Interval Training   Interval Training No --        Recumbant  Bike   Level 4 5.5      Minutes 15 15      METs 4.4 4.7        NuStep   Level 5 6      SPM 70 70      Minutes 15 15      METs 2.3 4.3               Exercise Comments:   Exercise Comments     Row Name 08/02/22 1217 09/18/22 1156         Exercise Comments Pt completed her first day of exercise. She exercised on the recumbent bike and Nustep for 15 min. Rosalynd averaged 1.9 METs at level 1 on the recumbent bike and 2.1 METs at level 1 on the Nustep. She performed the warmup and cooldown standing without limitations. Completed home exercise plan. Emeri is currently exercising at home. She walks 2-3 non-rehab days/wk for 10-15 min/day. I encouraged Shakti to increase her time to 30 min/day. I told her that she can split her time into 3x10 min or 2x15 min. I also recommended increased frequency when she gradautes the program. Mariann Laster agreed with my recommendations. Kattaleya is motivated to exercise and improve her functional capacity.               Exercise Goals and Review:   Exercise Goals     Row Name 07/27/22 0926 08/06/22 1158 09/05/22 0723 10/03/22 0728 11/06/22 0915     Exercise Goals   Increase Physical Activity Yes Yes Yes Yes Yes   Intervention Provide advice, education, support and counseling about physical activity/exercise needs.;Develop an individualized exercise prescription for aerobic and resistive training based on initial evaluation findings, risk stratification, comorbidities and participant's personal goals. Provide advice, education, support and counseling about physical activity/exercise  needs.;Develop an individualized exercise prescription for aerobic and resistive training based on initial evaluation findings, risk stratification, comorbidities and participant's personal goals. Provide advice, education, support and counseling about physical activity/exercise needs.;Develop an individualized exercise prescription for aerobic and resistive training based on initial  evaluation findings, risk stratification, comorbidities and participant's personal goals. Provide advice, education, support and counseling about physical activity/exercise needs.;Develop an individualized exercise prescription for aerobic and resistive training based on initial evaluation findings, risk stratification, comorbidities and participant's personal goals. Provide advice, education, support and counseling about physical activity/exercise needs.;Develop an individualized exercise prescription for aerobic and resistive training based on initial evaluation findings, risk stratification, comorbidities and participant's personal goals.   Expected Outcomes Short Term: Attend rehab on a regular basis to increase amount of physical activity.;Long Term: Add in home exercise to make exercise part of routine and to increase amount of physical activity.;Long Term: Exercising regularly at least 3-5 days a week. Short Term: Attend rehab on a regular basis to increase amount of physical activity.;Long Term: Add in home exercise to make exercise part of routine and to increase amount of physical activity.;Long Term: Exercising regularly at least 3-5 days a week. Short Term: Attend rehab on a regular basis to increase amount of physical activity.;Long Term: Add in home exercise to make exercise part of routine and to increase amount of physical activity.;Long Term: Exercising regularly at least 3-5 days a week. Short Term: Attend rehab on a regular basis to increase amount of physical activity.;Long Term: Add in home exercise to make exercise part of routine and to increase amount of physical activity.;Long Term: Exercising regularly at least 3-5 days a week. Short Term: Attend rehab on a regular basis to increase amount of physical activity.;Long Term: Add in home exercise to make exercise part of routine and to increase amount of physical activity.;Long Term: Exercising regularly at least 3-5 days a week.   Increase  Strength and Stamina Yes Yes Yes Yes Yes   Intervention Provide advice, education, support and counseling about physical activity/exercise needs.;Develop an individualized exercise prescription for aerobic and resistive training based on initial evaluation findings, risk stratification, comorbidities and participant's personal goals. Provide advice, education, support and counseling about physical activity/exercise needs.;Develop an individualized exercise prescription for aerobic and resistive training based on initial evaluation findings, risk stratification, comorbidities and participant's personal goals. Provide advice, education, support and counseling about physical activity/exercise needs.;Develop an individualized exercise prescription for aerobic and resistive training based on initial evaluation findings, risk stratification, comorbidities and participant's personal goals. Provide advice, education, support and counseling about physical activity/exercise needs.;Develop an individualized exercise prescription for aerobic and resistive training based on initial evaluation findings, risk stratification, comorbidities and participant's personal goals. Provide advice, education, support and counseling about physical activity/exercise needs.;Develop an individualized exercise prescription for aerobic and resistive training based on initial evaluation findings, risk stratification, comorbidities and participant's personal goals.   Expected Outcomes Short Term: Increase workloads from initial exercise prescription for resistance, speed, and METs.;Short Term: Perform resistance training exercises routinely during rehab and add in resistance training at home;Long Term: Improve cardiorespiratory fitness, muscular endurance and strength as measured by increased METs and functional capacity (6MWT) Short Term: Increase workloads from initial exercise prescription for resistance, speed, and METs.;Short Term: Perform  resistance training exercises routinely during rehab and add in resistance training at home;Long Term: Improve cardiorespiratory fitness, muscular endurance and strength as measured by increased METs and functional capacity (6MWT) Short Term: Increase workloads from initial exercise prescription for resistance,  speed, and METs.;Short Term: Perform resistance training exercises routinely during rehab and add in resistance training at home;Long Term: Improve cardiorespiratory fitness, muscular endurance and strength as measured by increased METs and functional capacity (6MWT) Short Term: Increase workloads from initial exercise prescription for resistance, speed, and METs.;Short Term: Perform resistance training exercises routinely during rehab and add in resistance training at home;Long Term: Improve cardiorespiratory fitness, muscular endurance and strength as measured by increased METs and functional capacity (6MWT) Short Term: Increase workloads from initial exercise prescription for resistance, speed, and METs.;Short Term: Perform resistance training exercises routinely during rehab and add in resistance training at home;Long Term: Improve cardiorespiratory fitness, muscular endurance and strength as measured by increased METs and functional capacity (6MWT)   Able to understand and use rate of perceived exertion (RPE) scale Yes Yes Yes Yes Yes   Intervention Provide education and explanation on how to use RPE scale Provide education and explanation on how to use RPE scale Provide education and explanation on how to use RPE scale Provide education and explanation on how to use RPE scale Provide education and explanation on how to use RPE scale   Expected Outcomes Short Term: Able to use RPE daily in rehab to express subjective intensity level;Long Term:  Able to use RPE to guide intensity level when exercising independently Short Term: Able to use RPE daily in rehab to express subjective intensity level;Long  Term:  Able to use RPE to guide intensity level when exercising independently Short Term: Able to use RPE daily in rehab to express subjective intensity level;Long Term:  Able to use RPE to guide intensity level when exercising independently Short Term: Able to use RPE daily in rehab to express subjective intensity level;Long Term:  Able to use RPE to guide intensity level when exercising independently Short Term: Able to use RPE daily in rehab to express subjective intensity level;Long Term:  Able to use RPE to guide intensity level when exercising independently   Able to understand and use Dyspnea scale Yes Yes Yes Yes Yes   Intervention Provide education and explanation on how to use Dyspnea scale Provide education and explanation on how to use Dyspnea scale Provide education and explanation on how to use Dyspnea scale Provide education and explanation on how to use Dyspnea scale Provide education and explanation on how to use Dyspnea scale   Expected Outcomes Short Term: Able to use Dyspnea scale daily in rehab to express subjective sense of shortness of breath during exertion;Long Term: Able to use Dyspnea scale to guide intensity level when exercising independently Short Term: Able to use Dyspnea scale daily in rehab to express subjective sense of shortness of breath during exertion;Long Term: Able to use Dyspnea scale to guide intensity level when exercising independently Short Term: Able to use Dyspnea scale daily in rehab to express subjective sense of shortness of breath during exertion;Long Term: Able to use Dyspnea scale to guide intensity level when exercising independently Short Term: Able to use Dyspnea scale daily in rehab to express subjective sense of shortness of breath during exertion;Long Term: Able to use Dyspnea scale to guide intensity level when exercising independently Short Term: Able to use Dyspnea scale daily in rehab to express subjective sense of shortness of breath during  exertion;Long Term: Able to use Dyspnea scale to guide intensity level when exercising independently   Knowledge and understanding of Target Heart Rate Range (THRR) Yes Yes Yes Yes Yes   Intervention Provide education and explanation of THRR including how  the numbers were predicted and where they are located for reference Provide education and explanation of THRR including how the numbers were predicted and where they are located for reference Provide education and explanation of THRR including how the numbers were predicted and where they are located for reference Provide education and explanation of THRR including how the numbers were predicted and where they are located for reference Provide education and explanation of THRR including how the numbers were predicted and where they are located for reference   Expected Outcomes Short Term: Able to state/look up THRR;Long Term: Able to use THRR to govern intensity when exercising independently;Short Term: Able to use daily as guideline for intensity in rehab Short Term: Able to state/look up THRR;Long Term: Able to use THRR to govern intensity when exercising independently;Short Term: Able to use daily as guideline for intensity in rehab Short Term: Able to state/look up THRR;Long Term: Able to use THRR to govern intensity when exercising independently;Short Term: Able to use daily as guideline for intensity in rehab Short Term: Able to state/look up THRR;Long Term: Able to use THRR to govern intensity when exercising independently;Short Term: Able to use daily as guideline for intensity in rehab Short Term: Able to state/look up THRR;Long Term: Able to use THRR to govern intensity when exercising independently;Short Term: Able to use daily as guideline for intensity in rehab   Understanding of Exercise Prescription Yes Yes Yes Yes Yes   Intervention Provide education, explanation, and written materials on patient's individual exercise prescription Provide  education, explanation, and written materials on patient's individual exercise prescription Provide education, explanation, and written materials on patient's individual exercise prescription Provide education, explanation, and written materials on patient's individual exercise prescription Provide education, explanation, and written materials on patient's individual exercise prescription   Expected Outcomes Short Term: Able to explain program exercise prescription;Long Term: Able to explain home exercise prescription to exercise independently Short Term: Able to explain program exercise prescription;Long Term: Able to explain home exercise prescription to exercise independently Short Term: Able to explain program exercise prescription;Long Term: Able to explain home exercise prescription to exercise independently Short Term: Able to explain program exercise prescription;Long Term: Able to explain home exercise prescription to exercise independently Short Term: Able to explain program exercise prescription;Long Term: Able to explain home exercise prescription to exercise independently            Exercise Goals Re-Evaluation :  Exercise Goals Re-Evaluation     Row Name 08/06/22 1158 09/05/22 0723 10/03/22 0728 11/06/22 0915       Exercise Goal Re-Evaluation   Exercise Goals Review Increase Physical Activity;Increase Strength and Stamina;Able to understand and use rate of perceived exertion (RPE) scale;Able to understand and use Dyspnea scale;Knowledge and understanding of Target Heart Rate Range (THRR);Understanding of Exercise Prescription Increase Physical Activity;Increase Strength and Stamina;Able to understand and use rate of perceived exertion (RPE) scale;Able to understand and use Dyspnea scale;Knowledge and understanding of Target Heart Rate Range (THRR);Understanding of Exercise Prescription Increase Physical Activity;Increase Strength and Stamina;Able to understand and use rate of perceived  exertion (RPE) scale;Able to understand and use Dyspnea scale;Knowledge and understanding of Target Heart Rate Range (THRR);Understanding of Exercise Prescription Increase Physical Activity;Increase Strength and Stamina;Able to understand and use rate of perceived exertion (RPE) scale;Able to understand and use Dyspnea scale;Knowledge and understanding of Target Heart Rate Range (THRR);Understanding of Exercise Prescription    Comments Jovie has completed 1 exercise session. She exercises on the recumbent bike and Nustep for 15  min. She averages 1.9 METs at level 1 on the recumbent bike and 2.1 METs at level 1 on the Nustep. She performs the warmup and cooldown standing without limitations. It is too soon to note any discernable progressions. Will continue to monitor and progress as able. Mykenzie has completed 9 exercise sessions. She exercises on the recumbent bike and Nustep for 15 min. She averages 3.8 METs at level 1 on the recumbent bike and 2.2 METs at level 4 on the Nustep. She performs the warmup and cooldown standing without limitations. Tonye has increased her workload for exercise modes. She has tolerated progressions well as METs have increased. Lorian seems motivated to exercise and improve her functional capacity. Will continue to monitor and progress as able. Colinda has completed 15 exercise sessions. She exercises on the recumbent bike and Nustep for 15 min. She averages 4.6 METs at level 4 on the recumbent bike and 3.2 METs at level 4 on the Nustep. She performs the warmup and cooldown standing without limitations. Laverna has increased her workload for exercise modes multiple time. She still tolerates progressions well as METs have increased. Suzy does exercise outside of  rehab 2-3 days/wk. We have recently looked at fitness centers that accept [place]. Will continue to monitor and progress as able. Novalynn has completed 20 exercise sessions. She exercises on the recumbent bike and Nustep for 15  min. She averages 2.7 METs at level 5 on the recumbent bike and 2.9 METs at level 6 on the Nustep. She performs the warmup and cooldown standing without limitations. Thia has increased her workloads for both exercise modes. Her METs have not improved despite this increase. Chong's METs decreased after she came back from vacation. She has not been feeling well enough to keep her METs up even though she is not ill. Will continue to monitor and progress as able.    Expected Outcomes Through exercise at rehab and home, the patient will decrease shortness of breath with daily activities and feel confident in carrying out an exercise regimen at home. Through exercise at rehab and home, the patient will decrease shortness of breath with daily activities and feel confident in carrying out an exercise regimen at home. Through exercise at rehab and home, the patient will decrease shortness of breath with daily activities and feel confident in carrying out an exercise regimen at home. Through exercise at rehab and home, the patient will decrease shortness of breath with daily activities and feel confident in carrying out an exercise regimen at home.             Discharge Exercise Prescription (Final Exercise Prescription Changes):  Exercise Prescription Changes - 11/06/22 1200       Response to Exercise   Blood Pressure (Admit) 108/66    Blood Pressure (Exercise) 140/60    Blood Pressure (Exit) 102/48    Heart Rate (Admit) 75 bpm    Heart Rate (Exercise) 100 bpm    Heart Rate (Exit) 83 bpm    Oxygen Saturation (Admit) 100 %    Oxygen Saturation (Exercise) 100 %    Oxygen Saturation (Exit) 95 %    Rating of Perceived Exertion (Exercise) 11    Perceived Dyspnea (Exercise) 2    Duration Continue with 30 min of aerobic exercise without signs/symptoms of physical distress.    Intensity THRR unchanged      Progression   Progression Continue to progress workloads to maintain intensity without  signs/symptoms of physical distress.    Average  METs 4.5      Resistance Training   Training Prescription Yes    Weight blue bands    Reps 10-15    Time 10 Minutes      Recumbant Bike   Level 5.5    Minutes 15    METs 4.7      NuStep   Level 6    SPM 70    Minutes 15    METs 4.3             Nutrition:  Target Goals: Understanding of nutrition guidelines, daily intake of sodium '1500mg'$ , cholesterol '200mg'$ , calories 30% from fat and 7% or less from saturated fats, daily to have 5 or more servings of fruits and vegetables.  Biometrics:  Pre Biometrics - 07/27/22 0910       Pre Biometrics   Grip Strength 19 kg              Nutrition Therapy Plan and Nutrition Goals:  Nutrition Therapy & Goals - 10/25/22 1105       Nutrition Therapy   Diet Heart Healthy diet      Personal Nutrition Goals   Nutrition Goal Patient to identify strategies for weight maintanence/weight gain of 0.5-2.0# per week.    Personal Goal #2 Patient to reduce sodium intake to '2000mg'$  per day    Personal Goal #3 Patient to identify food sources and limit daily intake of saturated fat, trans fat, sodium, and refined carbohydrates    Comments Goals in progress. Addaline saw GI today; was recommended to start bentyl PRN and metamucil daily. She did decline colonoscopy. She is up 5# since starting with our progam.  Farryn will continue to benefit from participating in pulmonary rehab for exercise, nutrition education, and lifestyle modification.      Intervention Plan   Intervention Prescribe, educate and counsel regarding individualized specific dietary modifications aiming towards targeted core components such as weight, hypertension, lipid management, diabetes, heart failure and other comorbidities.;Nutrition handout(s) given to patient.    Expected Outcomes Short Term Goal: Understand basic principles of dietary content, such as calories, fat, sodium, cholesterol and nutrients.;Short Term Goal: A  plan has been developed with personal nutrition goals set during dietitian appointment.;Long Term Goal: Adherence to prescribed nutrition plan.             Nutrition Assessments:  Nutrition Assessments - 08/02/22 1150       Rate Your Plate Scores   Pre Score 57            MEDIFICTS Score Key: ?70 Need to make dietary changes  40-70 Heart Healthy Diet ? 40 Therapeutic Level Cholesterol Diet  Flowsheet Row PULMONARY REHAB OTHER RESPIRATORY from 08/02/2022 in [facility]  Picture Your Plate Total Score on Admission 57      Picture Your Plate Scores: <19 Unhealthy dietary pattern with much room for improvement. 41-50 Dietary pattern unlikely to meet recommendations for good health and room for improvement. 51-60 More healthful dietary pattern, with some room for improvement.  >60 Healthy dietary pattern, although there may be some specific behaviors that could be improved.    Nutrition Goals Re-Evaluation:  Nutrition Goals Re-Evaluation     [place] Name 08/02/22 1132 08/28/22 1138 09/25/22 1153 10/25/22 1105       Goals   Current Weight 124 lb 5.4 oz (56.4 kg) 125 lb 7.1 oz (56.9 kg) 126 lb 1.7 oz (57.2 kg) 127 lb 13.9 oz (58  kg)    Comment LDL 143, triglycerides 161, cholesterol 221 No new labs; most recent labs show LDL 143, triglycerides 161, cholesterol 221 Unable to view lipid panel as it is outside epic; patient reports elevated cholesterol no new labs at this time. Patient will follow-up with cardiology in 11/2022. She has ongoing history of elevated lipids.    Expected Outcome Sadie reports concerns of GI discomfort including gas and bloating over the last month; she does have appointment with Gastroenterology in November. She also has history of IBS, GERD. She is down ~10# of unintentional weight loss since 09/21/2021 at 134#. In addition, per cardiology notes, she reports not tolerating statin medications and  declines PCSK9 intervention. We discussed strategies for weight maintenance and weight gain; she is amicable to introducing 1 nutrition supplements such as Boost or Ensure daily. She reports "grazing"/snacking behaviors throughout the day and often eats out for dinner. Goals in progress. Thersia continues 1 nutrition supplement daily (220-250kcals, 9-16g protein)  to aid with unintentional weight loss with ongoing reports of gas and bloating. She is up 2.6# since starting with our program. She cancelled her appointment with GI for this month; stressed the importanc of re-scheduling appointment with GI. She continues follow-up with cardiology for A-fib, dyslipidemia, CAD; she does not tolerate statins and declines PCSK9 intervention per most recent follow-up on 11/2. Goals in progress. Penelope has follow-up appointments with both GI and cardiology in January 2024. Unwanted weight loss has subsided and she is up 3.3# since starting with our program. She reports her cholesterol remains elevated >'300mg'$ / dL; we discussed dietary intervention for heart health including high fiber foods and portions, reducing sodium intake '2000mg'$ /day, reducing saturated fat intake (breakfast meats, processed meats,etc), and omega 3 rich foods. She continues to eat on a regular schedule and reports no change in appetite. She does eat out frequently. Goals in progress. Nashalie saw GI today; was recommended to start bentyl PRN and metamucil daily. She did decline colonoscopy. She is up 5# since starting with our progam. Patient has been educated on multiple strategies for weight gain and heart healthy eating habits. She does continue to choose take-out/ eat out regularly. Taliya will continue to benefit from participating in pulmonary rehab for exercise, nutrition education, and lifestyle modification.             Nutrition Goals Discharge (Final Nutrition Goals Re-Evaluation):  Nutrition Goals Re-Evaluation - 10/25/22 1105        Goals   Current Weight 127 lb 13.9 oz (58 kg)    Comment no new labs at this time. Patient will follow-up with cardiology in 11/2022. She has ongoing history of elevated lipids.    Expected Outcome Goals in progress. Rilee saw GI today; was recommended to start bentyl PRN and metamucil daily. She did decline colonoscopy. She is up 5# since starting with our progam. Patient has been educated on multiple strategies for weight gain and heart healthy eating habits. She does continue to choose take-out/ eat out regularly. Aeliana will continue to benefit from participating in pulmonary rehab for exercise, nutrition education, and lifestyle modification.             Psychosocial: Target Goals: Acknowledge presence or absence of significant depression and/or stress, maximize coping skills, provide positive support system. Participant is able to verbalize types and ability to use techniques and skills needed for reducing stress and depression.  Initial Review & Psychosocial Screening:  Initial Psych Review & Screening - 07/27/22 0920  Initial Review   Current issues with None Identified    Comments Lives w/ son. Does not report psychosocial barriers to exercise.      Family Dynamics   Good Support System? Yes    Comments son      Barriers   Psychosocial barriers to participate in program There are no identifiable barriers or psychosocial needs.      Screening Interventions   Interventions Encouraged to exercise             Quality of Life Scores:  Scores of 19 and below usually indicate a poorer quality of life in these areas.  A difference of  2-3 points is a clinically meaningful difference.  A difference of 2-3 points in the total score of the Quality of Life Index has been associated with significant improvement in overall quality of life, self-image, physical symptoms, and general health in studies assessing change in quality of life.  PHQ-9: Review Flowsheet        07/27/2022  Depression screen PHQ 2/9  Decreased Interest 0  Down, Depressed, Hopeless 0  PHQ - 2 Score 0  Altered sleeping 0  Tired, decreased energy 0  Change in appetite 0  Feeling bad or failure about yourself  0  Trouble concentrating 0  Moving slowly or fidgety/restless 0  Suicidal thoughts 0  PHQ-9 Score 0  Difficult doing work/chores Not difficult at all   Interpretation of Total Score  Total Score Depression Severity:  1-4 = Minimal depression, 5-9 = Mild depression, 10-14 = Moderate depression, 15-19 = Moderately severe depression, 20-27 = Severe depression   Psychosocial Evaluation and Intervention:  Psychosocial Evaluation - 07/27/22 0923       Psychosocial Evaluation & Interventions   Interventions Encouraged to exercise with the program and follow exercise prescription    Comments Allicia does not report any psychosocial concerns.    Expected Outcomes For Emunah to participate in Pulmonary Rehab free of psychosocial concerns.    Continue Psychosocial Services  No Follow up required             Psychosocial Re-Evaluation:  Psychosocial Re-Evaluation     [place] Name 08/10/22 [id number] 09/05/22 0725 10/02/22 1603 11/05/22 1313       Psychosocial Re-Evaluation   Current issues with None Identified None Identified None Identified None Identified    Comments Abrina has been participating in rehab free of psychosocial concerns. She feels that the program has helped improved her conditioning level. Akiva is still participating in rehab free of psychosocial concerns. She enjoys the group exercise setting as she converses with other patients. Justis has been attending the pulmonary rehab program without any psychosocial barriers. On her days off, she relaxes by reading. Jacyln has been attending the pulmonary rehab program without any psychosocial barriers. She enjoys coming to class and participating.    Expected Outcomes For Janiyah to participate in rehab free of psychosocial  concerns. For Laylamarie to participate in rehab free of psychosocial concerns. Mirai will continue to attend pulmonary rehab without any psychosocial barriers. Emani will continue to attend pulmonary rehab without any psychosocial barriers.    Interventions Encouraged to attend Pulmonary Rehabilitation for the exercise Encouraged to attend Pulmonary Rehabilitation for the exercise Encouraged to attend Pulmonary Rehabilitation for the exercise Encouraged to attend Pulmonary Rehabilitation for the exercise    Continue Psychosocial Services  No Follow up required No Follow up required No Follow up required No Follow up required  Psychosocial Discharge (Final Psychosocial Re-Evaluation):  Psychosocial Re-Evaluation - 11/05/22 1313       Psychosocial Re-Evaluation   Current issues with None Identified    Comments Josie has been attending the pulmonary rehab program without any psychosocial barriers. She enjoys coming to class and participating.    Expected Outcomes Trella will continue to attend pulmonary rehab without any psychosocial barriers.    Interventions Encouraged to attend Pulmonary Rehabilitation for the exercise    Continue Psychosocial Services  No Follow up required             Education: Education Goals: Education classes will be provided on a weekly basis, covering required topics. Participant will state understanding/return demonstration of topics presented.  Learning Barriers/Preferences:  Learning Barriers/Preferences - 07/27/22 0924       Learning Barriers/Preferences   Learning Barriers Sight   Bilat eye surgery   Learning Preferences None             Education Topics: Introduction to Pulmonary Rehab Group instruction provided by PowerPoint, verbal discussion, and written material to support subject matter. Instructor reviews what Pulmonary Rehab is, the purpose of the program, and how patients are referred.     Know Your Numbers Group  instruction that is supported by a PowerPoint presentation. Instructor discusses importance of knowing and understanding resting, exercise, and post-exercise oxygen saturation, heart rate, and blood pressure. Oxygen saturation, heart rate, blood pressure, rating of perceived exertion, and dyspnea are reviewed along with a normal range for these values.  Flowsheet Row PULMONARY REHAB OTHER RESPIRATORY from 10/04/2022 in [facility]  Date 08/09/22  Educator EP  Instruction Review Code 1- Verbalizes Understanding       Exercise for the Pulmonary Patient Group instruction that is supported by a PowerPoint presentation. Instructor discusses benefits of exercise, core components of exercise, frequency, duration, and intensity of an exercise routine, importance of utilizing pulse oximetry during exercise, safety while exercising, and options of places to exercise outside of rehab.       MET Level  Group instruction provided by PowerPoint, verbal discussion, and written material to support subject matter. Instructor reviews what METs are and how to increase METs.    Pulmonary Medications Verbally interactive group education provided by instructor with focus on inhaled medications and proper administration. Flowsheet Row PULMONARY REHAB OTHER RESPIRATORY from 11/01/2022 in [facility] [place]  Date 11/01/22  Educator EP  Instruction Review Code 1- Verbalizes Understanding       Anatomy and Physiology of the Respiratory System Group instruction provided by PowerPoint, verbal discussion, and written material to support subject matter. Instructor reviews respiratory cycle and anatomical components of the respiratory system and their functions. Instructor also reviews differences in obstructive and restrictive respiratory diseases with examples of each.  Flowsheet Row PULMONARY REHAB OTHER  RESPIRATORY from 10/04/2022 in [facility] for Heart, Vascular, & Lung Health  Date 08/02/22  Educator EP  Instruction Review Code 1- Verbalizes Understanding       Oxygen Safety Group instruction provided by PowerPoint, verbal discussion, and written material to support subject matter. There is an overview of "What is Oxygen" and "Why do we need it".  Instructor also reviews how to create a safe environment for oxygen use, the importance of using oxygen as prescribed, and the risks of noncompliance. There is a brief discussion on traveling with oxygen and resources the patient  may utilize. Flowsheet Row PULMONARY REHAB OTHER RESPIRATORY from 10/04/2022 in [facility] for Heart, Vascular, & Lung Health  Date 08/30/22  Educator EP  Instruction Review Code 1- Verbalizes Understanding       Oxygen Use Group instruction provided by PowerPoint, verbal discussion, and written material to discuss how supplemental oxygen is prescribed and different types of oxygen supply systems. Resources for more information are provided.  Flowsheet Row PULMONARY REHAB OTHER RESPIRATORY from 10/04/2022 in [facility] for Heart, Vascular, & Lung Health  Date 09/06/22  Educator Donnetta Simpers  [Handout]  Instruction Review Code 1- Verbalizes Understanding       Breathing Techniques Group instruction that is supported by demonstration and informational handouts. Instructor discusses the benefits of pursed lip and diaphragmatic breathing and detailed demonstration on how to perform both.  Flowsheet Row PULMONARY REHAB OTHER RESPIRATORY from 10/04/2022 in [facility] for Heart, Vascular, & Lung Health  Date 09/20/22  Educator EP  Instruction Review Code 1- Verbalizes Understanding        Risk Factor Reduction Group instruction that is supported by a PowerPoint presentation. Instructor discusses the definition of a risk  factor, different risk factors for pulmonary disease, and how the heart and lungs work together. Flowsheet Row PULMONARY REHAB OTHER RESPIRATORY from 10/04/2022 in [facility] for Heart, Vascular, & [place]  Date 10/04/22  Educator EP  Instruction Review Code 1- Verbalizes Understanding       MD Day A group question and answer session with a medical doctor that allows participants to ask questions that relate to their pulmonary disease state.   Nutrition for the Pulmonary Patient Group instruction provided by PowerPoint slides, verbal discussion, and written materials to support subject matter. The instructor gives an explanation and review of healthy diet recommendations, which includes a discussion on weight management, recommendations for fruit and vegetable consumption, as well as protein, fluid, caffeine, fiber, sodium, sugar, and alcohol. Tips for eating when patients are short of breath are discussed.    Other Education Group or individual verbal, written, or video instructions that support the educational goals of the pulmonary rehab program. Flowsheet Row PULMONARY REHAB OTHER RESPIRATORY from 10/04/2022 in [facility] for Heart, Vascular, & Lung Health  Date 09/27/22  Educator Donnetta Simpers  Instruction Review Code 1- Verbalizes Understanding        Knowledge Questionnaire Score:  Knowledge Questionnaire Score - 11/07/22 9767       Knowledge Questionnaire Score   Post Score 17/18             Core Components/Risk Factors/Patient Goals at Admission:  Personal Goals and Risk Factors at Admission - 07/27/22 0925       Core Components/Risk Factors/Patient Goals on Admission   Improve shortness of breath with ADL's Yes    Intervention Provide education, individualized exercise plan and daily activity instruction to help decrease symptoms of SOB with activities of daily living.    Expected Outcomes Short Term: Improve  cardiorespiratory fitness to achieve a reduction of symptoms when performing ADLs;Long Term: Be able to perform more ADLs without symptoms or delay the onset of symptoms    Increase knowledge of respiratory medications and ability to use respiratory devices properly  Yes    Intervention Provide education and demonstration as needed of appropriate use of medications, inhalers, and oxygen therapy.    Expected Outcomes Short Term: Achieves understanding of medications use. Understands that oxygen  is a medication prescribed by physician. Demonstrates appropriate use of inhaler and oxygen therapy.;Long Term: Maintain appropriate use of medications, inhalers, and oxygen therapy.             Core Components/Risk Factors/Patient Goals Review:   Goals and Risk Factor Review     Row Name 08/10/22 0645 09/05/22 0726 10/02/22 1610 11/05/22 1315       Core Components/Risk Factors/Patient Goals Review   Personal Goals Review Improve shortness of breath with ADL's;Develop more efficient breathing techniques such as purse lipped breathing and diaphragmatic breathing and practicing self-pacing with activity.;Increase knowledge of respiratory medications and ability to use respiratory devices properly. Improve shortness of breath with ADL's;Develop more efficient breathing techniques such as purse lipped breathing and diaphragmatic breathing and practicing self-pacing with activity.;Increase knowledge of respiratory medications and ability to use respiratory devices properly. Improve shortness of breath with ADL's;Develop more efficient breathing techniques such as purse lipped breathing and diaphragmatic breathing and practicing self-pacing with activity. Improve shortness of breath with ADL's;Develop more efficient breathing techniques such as purse lipped breathing and diaphragmatic breathing and practicing self-pacing with activity.    Review Baylei has completed 3 exercise sessions. She has increased her METs  on the recumbent bike and Nustep. She feels that her conditioning level has improved since starting rehab. Rozalyn has currently learned more about her disease. Will review medication later in the program. Will continue assess. Trinh has completed 9 exercise sessions. She has increased her METs on the recumbent bike and Nustep. She feels that her conditioning level has improved since starting rehab. Leahmarie was given resources to learn more about her pulmonary disease. She feels this benefitted her. Will continue to assess. Brya has completed 15 exercise sessions. She is enjoying working out on the recumbent bike and the NuStep while conversing with new friends in the class. She is increasing her workload and METs while maintaining her oxygenation on room air. Tyisha has attended education class about pursed lip and diaphragmatic breathing. Maryclaire has completed 20 exercise classes. She enjoys working out on the recumbent bike and the NuStep. She is currently maintaining her METs but has increased her workload on the equipment. She knows how to report her Rate of Perceived Exertion and her Dyspnea scale to staff. India has attended education classes about pursed lip and diaphragmatic breathing, anatomy and physiology of the lungs, pulmonary medications, and stress and energy conservation.    Expected Outcomes See admission goals. See admission goals. See admission goals See admission goals             Core Components/Risk Factors/Patient Goals at Discharge (Final Review):   Goals and Risk Factor Review - 11/05/22 1315       Core Components/Risk Factors/Patient Goals Review   Personal Goals Review Improve shortness of breath with ADL's;Develop more efficient breathing techniques such as purse lipped breathing and diaphragmatic breathing and practicing self-pacing with activity.    Review James has completed 20 exercise classes. She enjoys working out on the recumbent bike and the NuStep. She is currently  maintaining her METs but has increased her workload on the equipment. She knows how to report her Rate of Perceived Exertion and her Dyspnea scale to staff. Karielle has attended education classes about pursed lip and diaphragmatic breathing, anatomy and physiology of the lungs, pulmonary medications, and stress and energy conservation.    Expected Outcomes See admission goals             ITP Comments:   Comments:  Pt is making expected progress toward Pulmonary Rehab goals after completing 21 sessions. Recommend continued exercise, life style modification, education, and utilization of breathing techniques to increase stamina and strength, while also decreasing shortness of breath with exertion.  Dr. Rodman Pickle is Medical Director for Pulmonary Rehab at [facility].

## 2022-11-08 ENCOUNTER — Encounter (HOSPITAL_COMMUNITY)
Admission: RE | Admit: 2022-11-08 | Discharge: 2022-11-08 | Disposition: A | Discharge status: Home / Self Care | Payer: PPO | Source: Ambulatory Visit | Attending: Pulmonary Disease | Admitting: Pulmonary Disease

## 2022-11-08 DIAGNOSIS — J849 Interstitial pulmonary disease, unspecified: Secondary | ICD-10-CM

## 2022-11-08 NOTE — Progress Notes (Signed)
Daily Session Note  Patient Details  Name: Susan Bray MRN: [id number] Date of Birth: 1946/03/14 Referring Provider:   [name] Pulmonary Rehab Walk Test from 07/27/2022 in [facility] for Heart, Vascular, & [place]  Referring Provider [name]       Encounter Date: 11/08/2022  Check In:  Session Check In - 11/08/22 1125       Check-In   Supervising physician immediately available to respond to emergencies [facility] MD immediately available    Physician(s) Erin Hearing, NP    Location [facility]    Staff Present Janine Ores, RN, BSN;Randi Olen Cordial BS, ACSM-CEP, Exercise Physiologist;Kaylee Rosana Hoes, MS, ACSM-CEP, Exercise Physiologist;Other    Virtual Visit No    Medication changes reported     No    Fall or balance concerns reported    No    Tobacco Cessation No Change    Warm-up and Cool-down Performed as group-led instruction    Resistance Training Performed Yes    VAD Patient? No      Pain Assessment   Currently in Pain? No/denies             Capillary Blood Glucose: No results found for this or any previous visit (from the past 24 hour(s)).    Social History   Tobacco Use  Smoking Status Never  Smokeless Tobacco Never    Goals Met:  Independence with exercise equipment Improved SOB with ADL's Exercise tolerated well No report of concerns or symptoms today Strength training completed today  Goals Unmet:  Not Applicable  Comments: service time: 7680-8811   Dr. Rodman Pickle is Medical Director for Pulmonary Rehab at [facility].

## 2022-11-13 ENCOUNTER — Encounter (HOSPITAL_COMMUNITY)
Admission: RE | Admit: 2022-11-13 | Discharge: 2022-11-13 | Disposition: A | Discharge status: Home / Self Care | Payer: PPO | Source: Ambulatory Visit | Attending: Pulmonary Disease | Admitting: Pulmonary Disease

## 2022-11-13 DIAGNOSIS — J849 Interstitial pulmonary disease, unspecified: Secondary | ICD-10-CM | POA: Diagnosis not present

## 2022-11-13 NOTE — Progress Notes (Signed)
Daily Session Note  Patient Details  Name: Susan Bray MRN: [id number] Date of Birth: [birth year] Referring Provider:   [name] Pulmonary Rehab Walk Test from 07/27/2022 in [facility] for Heart, Vascular, & [place]  Referring Provider [name]       Encounter Date: 11/13/2022  Check In:  Session Check In - 11/13/22 1159       Check-In   Supervising physician immediately available to respond to emergencies [facility] MD immediately available    Physician(s) Eric Form, NP    Location [facility]    Staff Present Janine Ores, RN, BSN;Randi Olen Cordial BS, ACSM-CEP, Exercise Physiologist; Rosana Hoes, MS, ACSM-CEP, Exercise Physiologist;Other    Virtual Visit No    Medication changes reported     No    Fall or balance concerns reported    No    Tobacco Cessation No Change    Warm-up and Cool-down Performed as group-led instruction    Resistance Training Performed Yes    VAD Patient? No    PAD/SET Patient? No      Pain Assessment   Currently in Pain? No/denies    Multiple Pain Sites No             Capillary Blood Glucose: No results found for this or any previous visit (from the past 24 hour(s)).    Social History   Tobacco Use  Smoking Status Never  Smokeless Tobacco Never    Goals Met:  Independence with exercise equipment Exercise tolerated well No report of concerns or symptoms today Strength training completed today  Goals Unmet:  Not Applicable  Comments: Service time is from 1003 to 1140.    Dr. Rodman Pickle is Medical Director for Pulmonary Rehab at [facility].

## 2022-11-15 ENCOUNTER — Encounter (HOSPITAL_COMMUNITY)
Admission: RE | Admit: 2022-11-15 | Discharge: 2022-11-15 | Disposition: A | Discharge status: Home / Self Care | Payer: PPO | Source: Ambulatory Visit | Attending: Pulmonary Disease | Admitting: Pulmonary Disease

## 2022-11-15 DIAGNOSIS — J849 Interstitial pulmonary disease, unspecified: Secondary | ICD-10-CM | POA: Diagnosis not present

## 2022-11-15 NOTE — Progress Notes (Signed)
Daily Session Note  Patient Details  Name: Susan Bray MRN: [id number] Date of Birth: 14-Oct-1946 Referring Provider:   [name] Pulmonary Rehab Walk Test from 07/27/2022 in [facility] for Heart, Vascular, & [place]  Referring Provider [name]       Encounter Date: 11/15/2022  Check In:  Session Check In - 11/15/22 1020       Check-In   Supervising physician immediately available to respond to emergencies [facility] MD immediately available    Physician(s) Dr. Silas [place]    Location [facility]    Staff Present Janine Ores, RN, Quentin Ore, MS, ACSM-CEP, Exercise Physiologist;Other;Randi Yevonne Pax, ACSM-CEP, Exercise Physiologist    Virtual Visit No    Medication changes reported     No    Fall or balance concerns reported    No    Tobacco Cessation No Change    Warm-up and Cool-down Performed as group-led instruction    Resistance Training Performed Yes    VAD Patient? No    PAD/SET Patient? No      Pain Assessment   Currently in Pain? No/denies    Multiple Pain Sites No             Capillary Blood Glucose: No results found for this or any previous visit (from the past 24 hour(s)).    Social History   Tobacco Use  Smoking Status Never  Smokeless Tobacco Never    Goals Met:  Independence with exercise equipment Improved SOB with ADL's Exercise tolerated well No report of concerns or symptoms today Strength training completed today  Goals Unmet:  None  Comments: Service time is from 1013 to [place]    Dr. Rodman Pickle is Medical Director for Pulmonary Rehab at [facility].

## 2022-11-20 ENCOUNTER — Encounter (HOSPITAL_COMMUNITY)
Admission: RE | Admit: 2022-11-20 | Discharge: 2022-11-20 | Disposition: A | Discharge status: Home / Self Care | Payer: PPO | Source: Ambulatory Visit | Attending: Pulmonary Disease | Admitting: Pulmonary Disease

## 2022-11-20 ENCOUNTER — Encounter: Payer: Self-pay | Admitting: *Deleted

## 2022-11-20 VITALS — Wt 127.2 lb

## 2022-11-20 DIAGNOSIS — J849 Interstitial pulmonary disease, unspecified: Secondary | ICD-10-CM

## 2022-11-20 NOTE — Progress Notes (Signed)
Daily Session Note  Patient Details  Name: Susan Bray MRN: [id number] Date of Birth: 1946/05/24 Referring Provider:   April Bray Pulmonary Rehab Walk Test from 07/27/2022 in [facility] for Heart, Vascular, & [place]  Referring Provider Susan Bray       Encounter Date: 11/20/2022  Check In:  Session Check In - 11/20/22 1018       Check-In   Supervising physician immediately available to respond to emergencies [facility] MD immediately available    Physician(s) Susan Bray    [facility]    Staff Present Janine Ores, RN, Quentin Ore, MS, ACSM-CEP, Exercise Physiologist;Other;Randi Yevonne Pax, ACSM-CEP, Exercise Physiologist    Virtual Visit No    Medication changes reported     No    Fall or balance concerns reported    No    Tobacco Cessation No Change    Warm-up and Cool-down Performed as group-led instruction    Resistance Training Performed Yes    VAD Patient? No    PAD/SET Patient? No      Pain Assessment   Currently in Pain? No/denies    Multiple Pain Sites No             Capillary Blood Glucose: No results found for this or any previous visit (from the past 24 hour(s)).   Exercise Prescription Changes - 11/20/22 1200       Response to Exercise   Blood Pressure (Admit) 108/54    Blood Pressure (Exercise) 138/54    Blood Pressure (Exit) 108/50    Heart Rate (Admit) 67 bpm    Heart Rate (Exercise) 86 bpm    Heart Rate (Exit) 78 bpm    Oxygen Saturation (Admit) 100 %    Oxygen Saturation (Exercise) 98 %    Oxygen Saturation (Exit) 95 %    Rating of Perceived Exertion (Exercise) 11    Perceived Dyspnea (Exercise) 1    Duration Continue with 30 min of aerobic exercise without signs/symptoms of physical distress.    Intensity THRR unchanged      Progression   Progression Continue to progress workloads to maintain intensity without signs/symptoms of physical distress.    Average METs 4.5      Resistance  Training   Training Prescription Yes    Weight blue bands    Reps 10-15    Time 10 Minutes      Interval Training   Interval Training No      Recumbant Bike   Level 5.5    Minutes 15    METs 4.6      NuStep   Level 5    SPM 70    Minutes 15    METs 2.6             Social History   Tobacco Use  Smoking Status Never  Smokeless Tobacco Never    Goals Met:  Independence with exercise equipment Improved SOB with ADL's Exercise tolerated well No report of concerns or symptoms today Strength training completed today  Goals Unmet:  Not Applicable  Comments: Service time is from 1019 to [place]    Dr. Rodman Pickle is Medical Director for Pulmonary Rehab at [facility].

## 2022-11-22 ENCOUNTER — Encounter (HOSPITAL_COMMUNITY)
Admission: RE | Admit: 2022-11-22 | Discharge: 2022-11-22 | Disposition: A | Discharge status: Home / Self Care | Payer: PPO | Source: Ambulatory Visit | Attending: Pulmonary Disease | Admitting: Pulmonary Disease

## 2022-11-22 DIAGNOSIS — R002 Palpitations: Secondary | ICD-10-CM | POA: Diagnosis not present

## 2022-11-22 DIAGNOSIS — J849 Interstitial pulmonary disease, unspecified: Secondary | ICD-10-CM | POA: Diagnosis not present

## 2022-11-22 NOTE — Progress Notes (Signed)
Daily Session Note  Patient Details  Name: Susan Bray MRN: [id number] Date of Birth: 1946-06-14 Referring Provider:   [name] Pulmonary Rehab Walk Test from 07/27/2022 in [facility] for Heart, Vascular, & Susan Bray  Referring Provider [name]       Encounter Date: 11/22/2022  Check In:  Session Check In - 11/22/22 1014       Check-In   Supervising physician immediately available to respond to emergencies [facility] MD immediately available    Physician(s) [name]    [facility]    Staff Present Janine Ores, RN, Quentin Ore, MS, ACSM-CEP, Exercise Physiologist;Other;Randi Yevonne Pax, ACSM-CEP, Exercise Physiologist    Virtual Visit No    Medication changes reported     No    Fall or balance concerns reported    No    Tobacco Cessation No Change    Warm-up and Cool-down Performed as group-led instruction    Resistance Training Performed Yes    VAD Patient? No    PAD/SET Patient? No      Pain Assessment   Currently in Pain? No/denies    Multiple Pain Sites No             Capillary Blood Glucose: No results found for this or any previous visit (from the past 24 hour(s)).    Social History   Tobacco Use  Smoking Status Never  Smokeless Tobacco Never    Goals Met:  Proper associated with RPD/PD & O2 Sat Independence with exercise equipment Exercise tolerated well No report of concerns or symptoms today Strength training completed today  Goals Unmet:  Not Applicable  Comments: Service time is from 1015 to 1140.    Dr. Rodman Pickle is Medical Director for Pulmonary Rehab at [facility].

## 2022-11-27 ENCOUNTER — Encounter (HOSPITAL_COMMUNITY)
Admission: RE | Admit: 2022-11-27 | Discharge: 2022-11-27 | Disposition: A | Discharge status: Home / Self Care | Payer: PPO | Source: Ambulatory Visit | Attending: Pulmonary Disease | Admitting: Pulmonary Disease

## 2022-11-27 DIAGNOSIS — J849 Interstitial pulmonary disease, unspecified: Secondary | ICD-10-CM

## 2022-11-27 NOTE — Progress Notes (Signed)
Daily Session Note  Patient Details  Name: Susan Bray MRN: [id number] Date of Birth: 07-May-1946 Referring Provider:   [name] Pulmonary Rehab Walk Test from 07/27/2022 in [facility] for Heart, Vascular, & [place]  Referring Provider [name]       Encounter Date: 11/27/2022  Check In:  Session Check In - 11/27/22 1134       Check-In   Supervising physician immediately available to respond to emergencies [facility] MD immediately available    Physician(s) Thereasa Solo    [facility]    Staff Present Janine Ores, RN, BSN; Olen Cordial BS, ACSM-CEP, Exercise Physiologist;Other;Kaylee Rosana Hoes, MS, ACSM-CEP, Exercise Physiologist    Virtual Visit No    Medication changes reported     No    Fall or balance concerns reported    No    Tobacco Cessation No Change    Warm-up and Cool-down Performed as group-led instruction    Resistance Training Performed Yes    VAD Patient? No    PAD/SET Patient? No      Pain Assessment   Currently in Pain? No/denies             Capillary Blood Glucose: No results found for this or any previous visit (from the past 24 hour(s)).    Social History   Tobacco Use  Smoking Status Never  Smokeless Tobacco Never    Goals Met:  Proper associated with RPD/PD & O2 Sat Independence with exercise equipment Exercise tolerated well No report of concerns or symptoms today Strength training completed today  Goals Unmet:  Not Applicable  Comments: Service time is from 1014 to 1139.    Dr. Rodman Pickle is Medical Director for Pulmonary Rehab at [facility].

## 2022-11-29 ENCOUNTER — Encounter (HOSPITAL_COMMUNITY)
Admission: RE | Admit: 2022-11-29 | Discharge: 2022-11-29 | Disposition: A | Discharge status: Home / Self Care | Payer: PPO | Source: Ambulatory Visit | Attending: Pulmonary Disease | Admitting: Pulmonary Disease

## 2022-11-29 DIAGNOSIS — J849 Interstitial pulmonary disease, unspecified: Secondary | ICD-10-CM | POA: Diagnosis not present

## 2022-11-29 NOTE — Progress Notes (Signed)
Daily Session Note  Patient Details  Name: OLUWATOYIN BANALES MRN: [id number] Date of Birth: Jan 24, 1946 Referring Provider:   [name] Pulmonary Rehab Walk Test from 07/27/2022 in [facility] for Heart, Vascular, & Lung Health  Referring Provider [name]       Encounter Date: 11/29/2022  Check In:  Session Check In - 11/29/22 1159       Check-In   Supervising physician immediately available to respond to emergencies [facility] MD immediately available    Physician(s) [name]    Location [facility]    Staff Present Janine Ores, RN, BSN;Other; Rosana Hoes, MS, ACSM-CEP, Exercise Physiologist    Virtual Visit No    Medication changes reported     No    Fall or balance concerns reported    No    Tobacco Cessation No Change    Warm-up and Cool-down Performed as group-led instruction    Resistance Training Performed Yes    VAD Patient? No    PAD/SET Patient? No      Pain Assessment   Currently in Pain? No/denies    Multiple Pain Sites No             Capillary Blood Glucose: No results found for this or any previous visit (from the past 24 hour(s)).    Social History   Tobacco Use  Smoking Status Never  Smokeless Tobacco Never    Goals Met:  Proper associated with RPD/PD & O2 Sat Independence with exercise equipment Exercise tolerated well No report of concerns or symptoms today Strength training completed today  Goals Unmet:  Not Applicable  Comments: Service time is from 1014 to 1145.    Dr. Rodman Pickle is Medical Director for Pulmonary Rehab at [facility].

## 2022-11-30 ENCOUNTER — Ambulatory Visit: Payer: PPO | Admitting: Nurse Practitioner

## 2022-12-04 ENCOUNTER — Encounter (HOSPITAL_COMMUNITY)
Admission: RE | Admit: 2022-12-04 | Discharge: 2022-12-04 | Disposition: A | Discharge status: Home / Self Care | Payer: PPO | Source: Ambulatory Visit | Attending: Pulmonary Disease | Admitting: Pulmonary Disease

## 2022-12-04 VITALS — Wt 129.0 lb

## 2022-12-04 DIAGNOSIS — J849 Interstitial pulmonary disease, unspecified: Secondary | ICD-10-CM | POA: Diagnosis not present

## 2022-12-04 NOTE — Progress Notes (Signed)
Daily Session Note  Patient Details  Name: Susan Bray MRN: [id number] Date of Birth: 09-23-1946 Referring Provider:   [name] Pulmonary Rehab Walk Test from 07/27/2022 in [facility] for Heart, Vascular, & [place]  Referring Provider [name]       Encounter Date: 12/04/2022  Check In:  Session Check In - 12/04/22 1212       Check-In   Supervising physician immediately available to respond to emergencies [facility] MD immediately available    Physician(s) Gala Romney    [facility]    Staff Present Janine Ores, RN, Quentin Ore, MS, ACSM-CEP, Exercise Physiologist;Carlette Wilber Oliphant, RN, BSN    Virtual Visit No    Medication changes reported     No    Fall or balance concerns reported    No    Tobacco Cessation No Change    Warm-up and Cool-down Performed as group-led instruction    Resistance Training Performed Yes    VAD Patient? No    PAD/SET Patient? No      Pain Assessment   Currently in Pain? No/denies    Multiple Pain Sites No             Capillary Blood Glucose: No results found for this or any previous visit (from the past 24 hour(s)).   Exercise Prescription Changes - 12/04/22 1200       Response to Exercise   Blood Pressure (Admit) 122/62    Blood Pressure (Exercise) 138/60    Blood Pressure (Exit) 128/60    Heart Rate (Admit) 77 bpm    Heart Rate (Exercise) 103 bpm    Heart Rate (Exit) 77 bpm    Oxygen Saturation (Admit) 97 %    Oxygen Saturation (Exercise) 97 %    Oxygen Saturation (Exit) 98 %    Rating of Perceived Exertion (Exercise) 11    Perceived Dyspnea (Exercise) 1    Duration Continue with 30 min of aerobic exercise without signs/symptoms of physical distress.    Intensity THRR unchanged      Progression   Progression Continue to progress workloads to maintain intensity without signs/symptoms of physical distress.      Resistance Training   Training Prescription Yes    Weight blue  bands    Reps 10-15    Time 10 Minutes      Recumbant Bike   Level 5.5    Minutes 15    METs 2.9      NuStep   Level 5    SPM 70    Minutes 15    METs 2.7             Social History   Tobacco Use  Smoking Status Never  Smokeless Tobacco Never    Goals Met:  Proper associated with RPD/PD & O2 Sat Exercise tolerated well No report of concerns or symptoms today Strength training completed today  Goals Unmet:  Not Applicable  Comments: Service time is from 1015 to 1140.    Dr. Rodman Pickle is Medical Director for Pulmonary Rehab at [facility].

## 2022-12-05 NOTE — Progress Notes (Signed)
Pulmonary Individual Treatment Plan  Patient Details  Name: Susan Bray MRN: [id number] Date of Birth: [birth year] Referring Provider:   [name] Pulmonary Rehab Walk Test from 07/27/2022 in [facility] [place]  Referring Provider [name]       Initial Encounter Date:  Flowsheet Row Pulmonary Rehab Walk Test from 07/27/2022 in [facility] [place]  Date 07/27/22       Visit Diagnosis: ILD (interstitial lung disease) ([place])  Patient's Home Medications on Admission:   Current Outpatient Medications:    apixaban (ELIQUIS) 5 MG TABS tablet, Take 1 tablet (5 mg total) by mouth 2 (two) times daily. (Patient not taking: Reported on 10/25/2022), Disp: 180 tablet, Rfl: 1   azaTHIOprine (IMURAN) 50 MG tablet, Take 3 tablets (150 mg total) by mouth daily., Disp: 90 tablet, Rfl: 5   clobetasol ointment (TEMOVATE) 0.05 %, Apply topically 2 (two) times daily., Disp: , Rfl:    dicyclomine (BENTYL) 20 MG tablet, Take 1 tablet (20 mg total) by mouth every 6 (six) hours as needed for spasms., Disp: 30 tablet, Rfl: 3   diltiazem (CARDIZEM CD) 120 MG 24 hr capsule, Take 1 capsule (120 mg total) by mouth daily. (Patient not taking: Reported on 10/25/2022), Disp: 90 capsule, Rfl: 3   ezetimibe (ZETIA) 10 MG tablet, Take 10 mg by mouth daily., Disp: , Rfl:    fenofibrate micronized (LOFIBRA) 134 MG capsule, Take 134 mg by mouth daily before breakfast., Disp: , Rfl:    predniSONE (DELTASONE) 10 MG tablet, Take 5 mg by mouth daily., Disp: , Rfl:    SYNTHROID 50 MCG tablet, Take by mouth., Disp: , Rfl:   Past Medical History: Past Medical History:  Diagnosis Date   Atrial fibrillation ([place])    CAD (coronary artery disease)    Cataract    Diverticulitis    Hyperlipidemia    IBS (irritable bowel syndrome)    Thyroid disease    VIN III (vulvar intraepithelial neoplasia III)     Tobacco Use: Social  History   Tobacco Use  Smoking Status Never  Smokeless Tobacco Never    Labs: Review Flowsheet       Latest Ref Rng & Units 09/08/2014 11/15/2015  Labs for ITP Cardiac and Pulmonary Rehab  Cholestrol 125 - 200 mg/dL 180  149   LDL (calc) <130 mg/dL 113  79   HDL-C >=46 mg/dL 46.20  47   Trlycerides <150 mg/dL 106.0  117     Capillary Blood Glucose: No results found for: "GLUCAP"   Pulmonary Assessment Scores:  Pulmonary Assessment Scores     Row Name 07/27/22 1102 11/07/22 0644 11/07/22 0650     ADL UCSD   ADL Phase Entry Exit --   SOB Score total 53 59 --     CAT Score   CAT Score 14 15 --     mMRC Score   mMRC Score 3 -- --           UCSD: Self-administered rating of dyspnea associated with activities of daily living (ADLs) 6-point scale (0 = "not at all" to 5 = "maximal or unable to do because of breathlessness")  Scoring Scores range from 0 to 120.  Minimally important difference is 5 units  CAT: CAT can identify the health impairment of COPD patients and is better correlated with disease progression.  CAT has a scoring range of zero to 40. The CAT  score is classified into four groups of low (less than 10), medium (10 - 20), high (21-30) and very high (31-40) based on the impact level of disease on health status. A CAT score over 10 suggests significant symptoms.  A worsening CAT score could be explained by an exacerbation, poor medication adherence, poor inhaler technique, or progression of COPD or comorbid conditions.  CAT MCID is 2 points  mMRC: mMRC (Modified Medical Research Council) Dyspnea Scale is used to assess the degree of baseline functional disability in patients of respiratory disease due to dyspnea. No minimal important difference is established. A decrease in score of 1 point or greater is considered a positive change.   Pulmonary Function Assessment:  Pulmonary Function Assessment - 07/27/22 1102       Breath   Bilateral Breath Sounds  Clear    Shortness of Breath Yes;Fear of Shortness of Breath;Limiting activity             Exercise Target Goals: Exercise Program Goal: Individual exercise prescription set using results from initial 6 min walk test and THRR while considering  patient's activity barriers and safety.   Exercise Prescription Goal: Initial exercise prescription builds to 30-45 minutes a day of aerobic activity, 2-3 days per week.  Home exercise guidelines will be given to patient during program as part of exercise prescription that the participant will acknowledge.  Activity Barriers & Risk Stratification:  Activity Barriers & Cardiac Risk Stratification - 07/27/22 0925       Activity Barriers & Cardiac Risk Stratification   Activity Barriers Deconditioning;Muscular Weakness;Shortness of Breath;History of Falls    Cardiac Risk Stratification Moderate             6 Minute Walk:  6 Minute Walk     Row Name 07/27/22 1105         6 Minute Walk   Phase Initial     Distance 1005 feet     Walk Time 6 minutes     # of Rest Breaks 0     MPH 3.07     METS 3.35     RPE 13     Perceived Dyspnea  3     VO2 Peak 11.71     Symptoms No     Resting HR 69 bpm     Resting BP 120/60     Resting Oxygen Saturation  97 %     Exercise Oxygen Saturation  during 6 min walk 93 %     Max Ex. HR 80 bpm     Max Ex. BP 138/60     2 Minute Post BP 124/60       Interval HR   1 Minute HR 77     2 Minute HR 80     3 Minute HR 79     4 Minute HR 76     5 Minute HR 80     6 Minute HR 78     2 Minute Post HR 64     Interval Heart Rate? Yes       Interval Oxygen   Interval Oxygen? Yes     Baseline Oxygen Saturation % 97 %     1 Minute Oxygen Saturation % 97 %     1 Minute Liters of Oxygen 0 L     2 Minute Oxygen Saturation % 99 %     2 Minute Liters of Oxygen 0 L     3 Minute Oxygen Saturation % 93 %  3 Minute Liters of Oxygen 0 L     4 Minute Oxygen Saturation % 98 %     4 Minute Liters of  Oxygen 0 L     5 Minute Oxygen Saturation % 96 %     5 Minute Liters of Oxygen 0 L     6 Minute Oxygen Saturation % 97 %     6 Minute Liters of Oxygen 0 L     2 Minute Post Oxygen Saturation % 100 %     2 Minute Post Liters of Oxygen 64 L              Oxygen Initial Assessment:  Oxygen Initial Assessment - 07/27/22 0921       Home Oxygen   Home Oxygen Device None    Sleep Oxygen Prescription None    Home Exercise Oxygen Prescription None    Home Resting Oxygen Prescription None      Initial 6 min Walk   Oxygen Used None      Program Oxygen Prescription   Program Oxygen Prescription None      Intervention   Short Term Goals To learn and exhibit compliance with exercise, home and travel O2 prescription;To learn and understand importance of monitoring SPO2 with pulse oximeter and demonstrate accurate use of the pulse oximeter.;To learn and understand importance of maintaining oxygen saturations>88%;To learn and demonstrate proper pursed lip breathing techniques or other breathing techniques. ;To learn and demonstrate proper use of respiratory medications    Long  Term Goals Exhibits compliance with exercise, home  and travel O2 prescription;Verbalizes importance of monitoring SPO2 with pulse oximeter and return demonstration;Maintenance of O2 saturations>88%;Exhibits proper breathing techniques, such as pursed lip breathing or other method taught during program session;Compliance with respiratory medication;Demonstrates proper use of MDI's             Oxygen Re-Evaluation:  Oxygen Re-Evaluation     Row Name 08/06/22 1201 09/05/22 0725 10/03/22 0732 11/06/22 0924 11/27/22 1556     Program Oxygen Prescription   Program Oxygen Prescription None None None None None     Home Oxygen   Home Oxygen Device None None None None None   Sleep Oxygen Prescription None None None None None   Home Exercise Oxygen Prescription None None None None None   Home Resting Oxygen Prescription  None None None None None     Goals/Expected Outcomes   Short Term Goals To learn and exhibit compliance with exercise, home and travel O2 prescription;To learn and understand importance of monitoring SPO2 with pulse oximeter and demonstrate accurate use of the pulse oximeter.;To learn and understand importance of maintaining oxygen saturations>88%;To learn and demonstrate proper pursed lip breathing techniques or other breathing techniques. ;To learn and demonstrate proper use of respiratory medications To learn and exhibit compliance with exercise, home and travel O2 prescription;To learn and understand importance of monitoring SPO2 with pulse oximeter and demonstrate accurate use of the pulse oximeter.;To learn and understand importance of maintaining oxygen saturations>88%;To learn and demonstrate proper pursed lip breathing techniques or other breathing techniques. ;To learn and demonstrate proper use of respiratory medications To learn and exhibit compliance with exercise, home and travel O2 prescription;To learn and understand importance of monitoring SPO2 with pulse oximeter and demonstrate accurate use of the pulse oximeter.;To learn and understand importance of maintaining oxygen saturations>88%;To learn and demonstrate proper pursed lip breathing techniques or other breathing techniques. ;To learn and demonstrate proper use of respiratory medications To learn and exhibit compliance  with exercise, home and travel O2 prescription;To learn and understand importance of monitoring SPO2 with pulse oximeter and demonstrate accurate use of the pulse oximeter.;To learn and understand importance of maintaining oxygen saturations>88%;To learn and demonstrate proper pursed lip breathing techniques or other breathing techniques. ;To learn and demonstrate proper use of respiratory medications To learn and exhibit compliance with exercise, home and travel O2 prescription;To learn and understand importance of  monitoring SPO2 with pulse oximeter and demonstrate accurate use of the pulse oximeter.;To learn and understand importance of maintaining oxygen saturations>88%;To learn and demonstrate proper pursed lip breathing techniques or other breathing techniques. ;To learn and demonstrate proper use of respiratory medications   Long  Term Goals Exhibits compliance with exercise, home  and travel O2 prescription;Verbalizes importance of monitoring SPO2 with pulse oximeter and return demonstration;Maintenance of O2 saturations>88%;Exhibits proper breathing techniques, such as pursed lip breathing or other method taught during program session;Compliance with respiratory medication;Demonstrates proper use of MDI's Exhibits compliance with exercise, home  and travel O2 prescription;Verbalizes importance of monitoring SPO2 with pulse oximeter and return demonstration;Maintenance of O2 saturations>88%;Exhibits proper breathing techniques, such as pursed lip breathing or other method taught during program session;Compliance with respiratory medication;Demonstrates proper use of MDI's Exhibits compliance with exercise, home  and travel O2 prescription;Verbalizes importance of monitoring SPO2 with pulse oximeter and return demonstration;Maintenance of O2 saturations>88%;Exhibits proper breathing techniques, such as pursed lip breathing or other method taught during program session;Compliance with respiratory medication;Demonstrates proper use of MDI's Exhibits compliance with exercise, home  and travel O2 prescription;Verbalizes importance of monitoring SPO2 with pulse oximeter and return demonstration;Maintenance of O2 saturations>88%;Exhibits proper breathing techniques, such as pursed lip breathing or other method taught during program session;Compliance with respiratory medication;Demonstrates proper use of MDI's Exhibits compliance with exercise, home  and travel O2 prescription;Verbalizes importance of monitoring SPO2 with  pulse oximeter and return demonstration;Maintenance of O2 saturations>88%;Exhibits proper breathing techniques, such as pursed lip breathing or other method taught during program session;Compliance with respiratory medication;Demonstrates proper use of MDI's   Goals/Expected Outcomes Compliance and understanding of oxygen saturation monitoring and breathing techniques to decrease shortness of breath. Compliance and understanding of oxygen saturation monitoring and breathing techniques to decrease shortness of breath. Compliance and understanding of oxygen saturation monitoring and breathing techniques to decrease shortness of breath. Compliance and understanding of oxygen saturation monitoring and breathing techniques to decrease shortness of breath. Compliance and understanding of oxygen saturation monitoring and breathing techniques to decrease shortness of breath.            Oxygen Discharge (Final Oxygen Re-Evaluation):  Oxygen Re-Evaluation - 11/27/22 1556       Program Oxygen Prescription   Program Oxygen Prescription None      Home Oxygen   Home Oxygen Device None    Sleep Oxygen Prescription None    Home Exercise Oxygen Prescription None    Home Resting Oxygen Prescription None      Goals/Expected Outcomes   Short Term Goals To learn and exhibit compliance with exercise, home and travel O2 prescription;To learn and understand importance of monitoring SPO2 with pulse oximeter and demonstrate accurate use of the pulse oximeter.;To learn and understand importance of maintaining oxygen saturations>88%;To learn and demonstrate proper pursed lip breathing techniques or other breathing techniques. ;To learn and demonstrate proper use of respiratory medications    Long  Term Goals Exhibits compliance with exercise, home  and travel O2 prescription;Verbalizes importance of monitoring SPO2 with pulse oximeter and return demonstration;Maintenance of O2 saturations>88%;Exhibits proper  breathing  techniques, such as pursed lip breathing or other method taught during program session;Compliance with respiratory medication;Demonstrates proper use of MDI's    Goals/Expected Outcomes Compliance and understanding of oxygen saturation monitoring and breathing techniques to decrease shortness of breath.             Initial Exercise Prescription:  Initial Exercise Prescription - 07/27/22 1100       Date of Initial Exercise RX and Referring Provider   Date 07/27/22    Referring Provider [name]    Expected Discharge Date 10/04/22      Recumbant Bike   Level 1    Minutes 15    METs 2      NuStep   Level 1    SPM 60    Minutes 15    METs 1.8      Prescription Details   Frequency (times per week) 2    Duration Progress to 30 minutes of continuous aerobic without signs/symptoms of physical distress      Intensity   THRR 40-80% of Max Heartrate 58-117    Ratings of Perceived Exertion 11-13    Perceived Dyspnea 0-4      Progression   Progression Continue progressive overload as per policy without signs/symptoms or physical distress.      Resistance Training   Training Prescription Yes    Weight red bands    Reps 10-15             Perform Capillary Blood Glucose checks as needed.  Exercise Prescription Changes:   Exercise Prescription Changes     Row Name 08/07/22 1200 08/16/22 1200 08/28/22 1200 09/06/22 1637 09/25/22 1200     Response to Exercise   Blood Pressure (Admit) 118/62 114/54 114/62 96/40 112/52   Blood Pressure (Exercise) 130/60 132/60 110/58 -- 126/56   Blood Pressure (Exit) 106/60 104/62 120/66 108/54 110/60   Heart Rate (Admit) 60 bpm 64 bpm 95 bpm 59 bpm 62 bpm   Heart Rate (Exercise) 75 bpm 81 bpm 73 bpm 104 bpm 84 bpm   Heart Rate (Exit) 59 bpm 64 bpm 63 bpm 61 bpm 68 bpm   Oxygen Saturation (Admit) 99 % 100 % 98 % 98 % 98 %   Oxygen Saturation (Exercise) 97 % 100 % 96 % 97 % 98 %   Oxygen Saturation (Exit) 98 % 99 % 97 % 97 % 97 %    Rating of Perceived Exertion (Exercise) 13 13 13 10 13   $ Perceived Dyspnea (Exercise) 1 1 1 1 2   $ Duration Progress to 30 minutes of  aerobic without signs/symptoms of physical distress Continue with 30 min of aerobic exercise without signs/symptoms of physical distress. Continue with 30 min of aerobic exercise without signs/symptoms of physical distress. Continue with 30 min of aerobic exercise without signs/symptoms of physical distress. Continue with 30 min of aerobic exercise without signs/symptoms of physical distress.   Intensity Other (comment)  40-80% of HRR THRR unchanged THRR unchanged THRR unchanged THRR unchanged     Progression   Progression Continue to progress workloads to maintain intensity without signs/symptoms of physical distress. Continue to progress workloads to maintain intensity without signs/symptoms of physical distress. Continue to progress workloads to maintain intensity without signs/symptoms of physical distress. Continue to progress workloads to maintain intensity without signs/symptoms of physical distress. Continue to progress workloads to maintain intensity without signs/symptoms of physical distress.     Resistance Training   Training Prescription Yes Yes Yes Yes Yes  Weight Red bands Red bands Red bands Red bands Red bands   Reps 10-15 10-15 10-15 10-15 10-15   Time 10 Minutes 10 Minutes 10 Minutes 10 Minutes 10 Minutes     Interval Training   Interval Training -- No -- -- --     Recumbant Bike   Level 1 2 2 3 4   $ Minutes 15 15 15 15 15   $ METs 1.8 2 3.3 3.7 4.9     NuStep   Level 2 3 3 4 5   $ SPM 70 70 70 70 70   Minutes 15 15 15 15 15   $ METs 2 2.2 2.4 2.2 2.3    Row Name 10/23/22 1100 11/06/22 1200 11/20/22 1200 12/04/22 1200       Response to Exercise   Blood Pressure (Admit) 104/50 108/66 108/54 122/62    Blood Pressure (Exercise) -- 140/60 138/54 138/60    Blood Pressure (Exit) 112/58 102/48 108/50 128/60    Heart Rate (Admit) 66 bpm 75 bpm  67 bpm 77 bpm    Heart Rate (Exercise) 84 bpm 100 bpm 86 bpm 103 bpm    Heart Rate (Exit) 74 bpm 83 bpm 78 bpm 77 bpm    Oxygen Saturation (Admit) 98 % 100 % 100 % 97 %    Oxygen Saturation (Exercise) 98 % 100 % 98 % 97 %    Oxygen Saturation (Exit) 96 % 95 % 95 % 98 %    Rating of Perceived Exertion (Exercise) 11 11 11 11    $ Perceived Dyspnea (Exercise) 1 2 1 1    $ Duration Continue with 30 min of aerobic exercise without signs/symptoms of physical distress. Continue with 30 min of aerobic exercise without signs/symptoms of physical distress. Continue with 30 min of aerobic exercise without signs/symptoms of physical distress. Continue with 30 min of aerobic exercise without signs/symptoms of physical distress.    Intensity THRR unchanged THRR unchanged THRR unchanged THRR unchanged      Progression   Progression Continue to progress workloads to maintain intensity without signs/symptoms of physical distress. Continue to progress workloads to maintain intensity without signs/symptoms of physical distress. Continue to progress workloads to maintain intensity without signs/symptoms of physical distress. Continue to progress workloads to maintain intensity without signs/symptoms of physical distress.    Average METs 4.4 4.5 4.5 --      Resistance Training   Training Prescription Yes Yes Yes Yes    Weight Red bands blue bands blue bands blue bands    Reps 10-15 10-15 10-15 10-15    Time 10 Minutes 10 Minutes 10 Minutes 10 Minutes      Interval Training   Interval Training No -- No --      Recumbant Bike   Level 4 5.5 5.5 5.5    Minutes 15 15 15 15    $ METs 4.4 4.7 4.6 2.9      NuStep   Level 5 6 5 5    $ SPM 70 70 70 70    Minutes 15 15 15 15    $ METs 2.3 4.3 2.6 2.7             Exercise Comments:   Exercise Comments     Row Name 08/02/22 1217 09/18/22 1156         Exercise Comments Pt completed her first day of exercise. She exercised on the recumbent bike and Nustep for 15  min. Myron averaged 1.9 METs at level 1 on the recumbent bike and  2.1 METs at level 1 on the Nustep. She performed the warmup and cooldown standing without limitations. Completed home exercise plan. Amarria is currently exercising at home. She walks 2-3 non-rehab days/wk for 10-15 min/day. I encouraged Konnor to increase her time to 30 min/day. I told her that she can split her time into 3x10 min or 2x15 min. I also recommended increased frequency when she gradautes the program. Mariann Laster agreed with my recommendations. Scarlet is motivated to exercise and improve her functional capacity.               Exercise Goals and Review:   Exercise Goals     Row Name 07/27/22 0926 08/06/22 1158 09/05/22 0723 10/03/22 0728 11/06/22 0915     Exercise Goals   Increase Physical Activity Yes Yes Yes Yes Yes   Intervention Provide advice, education, support and counseling about physical activity/exercise needs.;Develop an individualized exercise prescription for aerobic and resistive training based on initial evaluation findings, risk stratification, comorbidities and participant's personal goals. Provide advice, education, support and counseling about physical activity/exercise needs.;Develop an individualized exercise prescription for aerobic and resistive training based on initial evaluation findings, risk stratification, comorbidities and participant's personal goals. Provide advice, education, support and counseling about physical activity/exercise needs.;Develop an individualized exercise prescription for aerobic and resistive training based on initial evaluation findings, risk stratification, comorbidities and participant's personal goals. Provide advice, education, support and counseling about physical activity/exercise needs.;Develop an individualized exercise prescription for aerobic and resistive training based on initial evaluation findings, risk stratification, comorbidities and participant's personal goals.  Provide advice, education, support and counseling about physical activity/exercise needs.;Develop an individualized exercise prescription for aerobic and resistive training based on initial evaluation findings, risk stratification, comorbidities and participant's personal goals.   Expected Outcomes Short Term: Attend rehab on a regular basis to increase amount of physical activity.;Long Term: Add in home exercise to make exercise part of routine and to increase amount of physical activity.;Long Term: Exercising regularly at least 3-5 days a week. Short Term: Attend rehab on a regular basis to increase amount of physical activity.;Long Term: Add in home exercise to make exercise part of routine and to increase amount of physical activity.;Long Term: Exercising regularly at least 3-5 days a week. Short Term: Attend rehab on a regular basis to increase amount of physical activity.;Long Term: Add in home exercise to make exercise part of routine and to increase amount of physical activity.;Long Term: Exercising regularly at least 3-5 days a week. Short Term: Attend rehab on a regular basis to increase amount of physical activity.;Long Term: Add in home exercise to make exercise part of routine and to increase amount of physical activity.;Long Term: Exercising regularly at least 3-5 days a week. Short Term: Attend rehab on a regular basis to increase amount of physical activity.;Long Term: Add in home exercise to make exercise part of routine and to increase amount of physical activity.;Long Term: Exercising regularly at least 3-5 days a week.   Increase Strength and Stamina Yes Yes Yes Yes Yes   Intervention Provide advice, education, support and counseling about physical activity/exercise needs.;Develop an individualized exercise prescription for aerobic and resistive training based on initial evaluation findings, risk stratification, comorbidities and participant's personal goals. Provide advice, education, support  and counseling about physical activity/exercise needs.;Develop an individualized exercise prescription for aerobic and resistive training based on initial evaluation findings, risk stratification, comorbidities and participant's personal goals. Provide advice, education, support and counseling about physical activity/exercise needs.;Develop an individualized exercise prescription for aerobic  and resistive training based on initial evaluation findings, risk stratification, comorbidities and participant's personal goals. Provide advice, education, support and counseling about physical activity/exercise needs.;Develop an individualized exercise prescription for aerobic and resistive training based on initial evaluation findings, risk stratification, comorbidities and participant's personal goals. Provide advice, education, support and counseling about physical activity/exercise needs.;Develop an individualized exercise prescription for aerobic and resistive training based on initial evaluation findings, risk stratification, comorbidities and participant's personal goals.   Expected Outcomes Short Term: Increase workloads from initial exercise prescription for resistance, speed, and METs.;Short Term: Perform resistance training exercises routinely during rehab and add in resistance training at home;Long Term: Improve cardiorespiratory fitness, muscular endurance and strength as measured by increased METs and functional capacity (6MWT) Short Term: Increase workloads from initial exercise prescription for resistance, speed, and METs.;Short Term: Perform resistance training exercises routinely during rehab and add in resistance training at home;Long Term: Improve cardiorespiratory fitness, muscular endurance and strength as measured by increased METs and functional capacity (6MWT) Short Term: Increase workloads from initial exercise prescription for resistance, speed, and METs.;Short Term: Perform resistance training  exercises routinely during rehab and add in resistance training at home;Long Term: Improve cardiorespiratory fitness, muscular endurance and strength as measured by increased METs and functional capacity (6MWT) Short Term: Increase workloads from initial exercise prescription for resistance, speed, and METs.;Short Term: Perform resistance training exercises routinely during rehab and add in resistance training at home;Long Term: Improve cardiorespiratory fitness, muscular endurance and strength as measured by increased METs and functional capacity (6MWT) Short Term: Increase workloads from initial exercise prescription for resistance, speed, and METs.;Short Term: Perform resistance training exercises routinely during rehab and add in resistance training at home;Long Term: Improve cardiorespiratory fitness, muscular endurance and strength as measured by increased METs and functional capacity (6MWT)   Able to understand and use rate of perceived exertion (RPE) scale Yes Yes Yes Yes Yes   Intervention Provide education and explanation on how to use RPE scale Provide education and explanation on how to use RPE scale Provide education and explanation on how to use RPE scale Provide education and explanation on how to use RPE scale Provide education and explanation on how to use RPE scale   Expected Outcomes Short Term: Able to use RPE daily in rehab to express subjective intensity level;Long Term:  Able to use RPE to guide intensity level when exercising independently Short Term: Able to use RPE daily in rehab to express subjective intensity level;Long Term:  Able to use RPE to guide intensity level when exercising independently Short Term: Able to use RPE daily in rehab to express subjective intensity level;Long Term:  Able to use RPE to guide intensity level when exercising independently Short Term: Able to use RPE daily in rehab to express subjective intensity level;Long Term:  Able to use RPE to guide intensity  level when exercising independently Short Term: Able to use RPE daily in rehab to express subjective intensity level;Long Term:  Able to use RPE to guide intensity level when exercising independently   Able to understand and use Dyspnea scale Yes Yes Yes Yes Yes   Intervention Provide education and explanation on how to use Dyspnea scale Provide education and explanation on how to use Dyspnea scale Provide education and explanation on how to use Dyspnea scale Provide education and explanation on how to use Dyspnea scale Provide education and explanation on how to use Dyspnea scale   Expected Outcomes Short Term: Able to use Dyspnea scale daily in rehab to  express subjective sense of shortness of breath during exertion;Long Term: Able to use Dyspnea scale to guide intensity level when exercising independently Short Term: Able to use Dyspnea scale daily in rehab to express subjective sense of shortness of breath during exertion;Long Term: Able to use Dyspnea scale to guide intensity level when exercising independently Short Term: Able to use Dyspnea scale daily in rehab to express subjective sense of shortness of breath during exertion;Long Term: Able to use Dyspnea scale to guide intensity level when exercising independently Short Term: Able to use Dyspnea scale daily in rehab to express subjective sense of shortness of breath during exertion;Long Term: Able to use Dyspnea scale to guide intensity level when exercising independently Short Term: Able to use Dyspnea scale daily in rehab to express subjective sense of shortness of breath during exertion;Long Term: Able to use Dyspnea scale to guide intensity level when exercising independently   Knowledge and understanding of Target Heart Rate Range (THRR) Yes Yes Yes Yes Yes   Intervention Provide education and explanation of THRR including how the numbers were predicted and where they are located for reference Provide education and explanation of THRR including  how the numbers were predicted and where they are located for reference Provide education and explanation of THRR including how the numbers were predicted and where they are located for reference Provide education and explanation of THRR including how the numbers were predicted and where they are located for reference Provide education and explanation of THRR including how the numbers were predicted and where they are located for reference   Expected Outcomes Short Term: Able to state/look up THRR;Long Term: Able to use THRR to govern intensity when exercising independently;Short Term: Able to use daily as guideline for intensity in rehab Short Term: Able to state/look up THRR;Long Term: Able to use THRR to govern intensity when exercising independently;Short Term: Able to use daily as guideline for intensity in rehab Short Term: Able to state/look up THRR;Long Term: Able to use THRR to govern intensity when exercising independently;Short Term: Able to use daily as guideline for intensity in rehab Short Term: Able to state/look up THRR;Long Term: Able to use THRR to govern intensity when exercising independently;Short Term: Able to use daily as guideline for intensity in rehab Short Term: Able to state/look up THRR;Long Term: Able to use THRR to govern intensity when exercising independently;Short Term: Able to use daily as guideline for intensity in rehab   Understanding of Exercise Prescription Yes Yes Yes Yes Yes   Intervention Provide education, explanation, and written materials on patient's individual exercise prescription Provide education, explanation, and written materials on patient's individual exercise prescription Provide education, explanation, and written materials on patient's individual exercise prescription Provide education, explanation, and written materials on patient's individual exercise prescription Provide education, explanation, and written materials on patient's individual exercise  prescription   Expected Outcomes Short Term: Able to explain program exercise prescription;Long Term: Able to explain home exercise prescription to exercise independently Short Term: Able to explain program exercise prescription;Long Term: Able to explain home exercise prescription to exercise independently Short Term: Able to explain program exercise prescription;Long Term: Able to explain home exercise prescription to exercise independently Short Term: Able to explain program exercise prescription;Long Term: Able to explain home exercise prescription to exercise independently Short Term: Able to explain program exercise prescription;Long Term: Able to explain home exercise prescription to exercise independently    [place] Name 11/27/22 1556  Exercise Goals   Increase Physical Activity Yes       Intervention Provide advice, education, support and counseling about physical activity/exercise needs.;Develop an individualized exercise prescription for aerobic and resistive training based on initial evaluation findings, risk stratification, comorbidities and participant's personal goals.       Expected Outcomes Short Term: Attend rehab on a regular basis to increase amount of physical activity.;Long Term: Add in home exercise to make exercise part of routine and to increase amount of physical activity.;Long Term: Exercising regularly at least 3-5 days a week.       Increase Strength and Stamina Yes       Intervention Provide advice, education, support and counseling about physical activity/exercise needs.;Develop an individualized exercise prescription for aerobic and resistive training based on initial evaluation findings, risk stratification, comorbidities and participant's personal goals.       Expected Outcomes Short Term: Increase workloads from initial exercise prescription for resistance, speed, and METs.;Short Term: Perform resistance training exercises routinely during rehab and add in  resistance training at home;Long Term: Improve cardiorespiratory fitness, muscular endurance and strength as measured by increased METs and functional capacity (6MWT)       Able to understand and use rate of perceived exertion (RPE) scale Yes       Intervention Provide education and explanation on how to use RPE scale       Expected Outcomes Short Term: Able to use RPE daily in rehab to express subjective intensity level;Long Term:  Able to use RPE to guide intensity level when exercising independently       Able to understand and use Dyspnea scale Yes       Intervention Provide education and explanation on how to use Dyspnea scale       Expected Outcomes Short Term: Able to use Dyspnea scale daily in rehab to express subjective sense of shortness of breath during exertion;Long Term: Able to use Dyspnea scale to guide intensity level when exercising independently       Knowledge and understanding of Target Heart Rate Range (THRR) Yes       Intervention Provide education and explanation of THRR including how the numbers were predicted and where they are located for reference       Expected Outcomes Short Term: Able to state/look up THRR;Long Term: Able to use THRR to govern intensity when exercising independently;Short Term: Able to use daily as guideline for intensity in rehab       Understanding of Exercise Prescription Yes       Intervention Provide education, explanation, and written materials on patient's individual exercise prescription       Expected Outcomes Short Term: Able to explain program exercise prescription;Long Term: Able to explain home exercise prescription to exercise independently                Exercise Goals Re-Evaluation :  Exercise Goals Re-Evaluation     Row Name 08/06/22 1158 09/05/22 0723 10/03/22 0728 11/06/22 0915 11/27/22 1556     Exercise Goal Re-Evaluation   Exercise Goals Review Increase Physical Activity;Increase Strength and Stamina;Able to understand and  use rate of perceived exertion (RPE) scale;Able to understand and use Dyspnea scale;Knowledge and understanding of Target Heart Rate Range (THRR);Understanding of Exercise Prescription Increase Physical Activity;Increase Strength and Stamina;Able to understand and use rate of perceived exertion (RPE) scale;Able to understand and use Dyspnea scale;Knowledge and understanding of Target Heart Rate Range (THRR);Understanding of Exercise Prescription Increase Physical Activity;Increase Strength and Stamina;Able to understand  and use rate of perceived exertion (RPE) scale;Able to understand and use Dyspnea scale;Knowledge and understanding of Target Heart Rate Range (THRR);Understanding of Exercise Prescription Increase Physical Activity;Increase Strength and Stamina;Able to understand and use rate of perceived exertion (RPE) scale;Able to understand and use Dyspnea scale;Knowledge and understanding of Target Heart Rate Range (THRR);Understanding of Exercise Prescription Increase Physical Activity;Increase Strength and Stamina;Able to understand and use rate of perceived exertion (RPE) scale;Able to understand and use Dyspnea scale;Knowledge and understanding of Target Heart Rate Range (THRR);Understanding of Exercise Prescription   Comments Rosilyn has completed 1 exercise session. She exercises on the recumbent bike and Nustep for 15 min. She averages 1.9 METs at level 1 on the recumbent bike and 2.1 METs at level 1 on the Nustep. She performs the warmup and cooldown standing without limitations. It is too soon to note any discernable progressions. Will continue to monitor and progress as able. Sharilynn has completed 9 exercise sessions. She exercises on the recumbent bike and Nustep for 15 min. She averages 3.8 METs at level 1 on the recumbent bike and 2.2 METs at level 4 on the Nustep. She performs the warmup and cooldown standing without limitations. Mikaia has increased her workload for exercise modes. She has tolerated  progressions well as METs have increased. Laquiesha seems motivated to exercise and improve her functional capacity. Will continue to monitor and progress as able. Kasidy has completed 15 exercise sessions. She exercises on the recumbent bike and Nustep for 15 min. She averages 4.6 METs at level 4 on the recumbent bike and 3.2 METs at level 4 on the Nustep. She performs the warmup and cooldown standing without limitations. Annalynn has increased her workload for exercise modes multiple time. She still tolerates progressions well as METs have increased. Guyla does exercise outside of  rehab 2-3 days/wk. We have recently looked at fitness centers that accept [place]. Will continue to monitor and progress as able. Dashelle has completed 20 exercise sessions. She exercises on the recumbent bike and Nustep for 15 min. She averages 2.7 METs at level 5 on the recumbent bike and 2.9 METs at level 6 on the Nustep. She performs the warmup and cooldown standing without limitations. Apolonia has increased her workloads for both exercise modes. Her METs have not improved despite this increase. Oona's METs decreased after she came back from vacation. She has not been feeling well enough to keep her METs up even though she is not ill. Will continue to monitor and progress as able. Leena has completed 27 exercise sessions. She exercises on the recumbent bike and Nustep for 15 min. She averages 4.4 METs at level 5.5 on the recumbent bike and 2.5 METs at level 5 on the Nustep. She performs the warmup and cooldown standing without limitations. Rhapsody increased her workloads for both exercise modes. She was successful in increasing her METs on the recumbent bike but not the Nustep. Her workload on the Nustep was decreased because it was too hard for her. Myrla has hit a plateau as I do not see any significant progressions. She has met program goals as she is nearing graduation. I believe Darbey will be successful in her home exercise. Will  continue to monitor and progress as able.   Expected Outcomes Through exercise at rehab and home, the patient will decrease shortness of breath with daily activities and feel confident in carrying out an exercise regimen at home. Through exercise at rehab and home, the patient will decrease shortness of breath with  daily activities and feel confident in carrying out an exercise regimen at home. Through exercise at rehab and home, the patient will decrease shortness of breath with daily activities and feel confident in carrying out an exercise regimen at home. Through exercise at rehab and home, the patient will decrease shortness of breath with daily activities and feel confident in carrying out an exercise regimen at home. Through exercise at rehab and home, the patient will decrease shortness of breath with daily activities and feel confident in carrying out an exercise regimen at home.            Discharge Exercise Prescription (Final Exercise Prescription Changes):  Exercise Prescription Changes - 12/04/22 1200       Response to Exercise   Blood Pressure (Admit) 122/62    Blood Pressure (Exercise) 138/60    Blood Pressure (Exit) 128/60    Heart Rate (Admit) 77 bpm    Heart Rate (Exercise) 103 bpm    Heart Rate (Exit) 77 bpm    Oxygen Saturation (Admit) 97 %    Oxygen Saturation (Exercise) 97 %    Oxygen Saturation (Exit) 98 %    Rating of Perceived Exertion (Exercise) 11    Perceived Dyspnea (Exercise) 1    Duration Continue with 30 min of aerobic exercise without signs/symptoms of physical distress.    Intensity THRR unchanged      Progression   Progression Continue to progress workloads to maintain intensity without signs/symptoms of physical distress.      Resistance Training   Training Prescription Yes    Weight blue bands    Reps 10-15    Time 10 Minutes      Recumbant Bike   Level 5.5    Minutes 15    METs 2.9      NuStep   Level 5    SPM 70    Minutes 15     METs 2.7             Nutrition:  Target Goals: Understanding of nutrition guidelines, daily intake of sodium <1538m, cholesterol <2073m calories 30% from fat and 7% or less from saturated fats, daily to have 5 or more servings of fruits and vegetables.  Biometrics:  Pre Biometrics - 07/27/22 0910       Pre Biometrics   Grip Strength 19 kg              Nutrition Therapy Plan and Nutrition Goals:  Nutrition Therapy & Goals - 11/20/22 1058       Nutrition Therapy   Diet Heart Healthy diet      Personal Nutrition Goals   Nutrition Goal Patient to identify strategies for weight maintanence/weight gain of 0.5-2.0# per week.    Personal Goal #2 Patient to reduce sodium intake to <200067mer day    Personal Goal #3 Patient to identify food sources and limit daily intake of saturated fat, trans fat, sodium, and refined carbohydrates    Comments Goals in progress. Most recent labs show improved cholesterol with total cholesterol down to 127 and LDL down to 69 since starting fenobibrate and zetia in October 2023. She continues follow-up with GI regarding ongoing GI symptoms. She is up 4.4# since starting with our program. WanAnaleyall continue to benefit from participation in pulmonary rehab for nutrition, exercise, and lifestyle modification.      Intervention Plan   Intervention Prescribe, educate and counsel regarding individualized specific dietary modifications aiming towards targeted core components such as weight, hypertension,  lipid management, diabetes, heart failure and other comorbidities.;Nutrition handout(s) given to patient.    Expected Outcomes Short Term Goal: Understand basic principles of dietary content, such as calories, fat, sodium, cholesterol and nutrients.;Short Term Goal: A plan has been developed with personal nutrition goals set during dietitian appointment.;Long Term Goal: Adherence to prescribed nutrition plan.             Nutrition Assessments:   Nutrition Assessments - 11/08/22 1446       Rate Your Plate Scores   Post Score 59            MEDIFICTS Score Key: ?70 Need to make dietary changes  40-70 Heart Healthy Diet ? 40 Therapeutic Level Cholesterol Diet  Flowsheet Row PULMONARY REHAB OTHER RESPIRATORY from 11/08/2022 in [facility]  Picture Your Plate Total Score on Discharge 59      Picture Your Plate Scores: [id number] Unhealthy dietary pattern with much room for improvement. 41-50 Dietary pattern unlikely to meet recommendations for good health and room for improvement. 51-60 More healthful dietary pattern, with some room for improvement.  >60 Healthy dietary pattern, although there may be some specific behaviors that could be improved.    Nutrition Goals Re-Evaluation:  Nutrition Goals Re-Evaluation     Susan Moore Name 08/02/22 1132 08/28/22 1138 09/25/22 1153 10/25/22 1105 11/20/22 1058     Goals   Current Weight 124 lb 5.4 oz (56.4 kg) 125 lb 7.1 oz (56.9 kg) 126 lb 1.7 oz (57.2 kg) 127 lb 13.9 oz (58 kg) 127 lb 3.3 oz (57.7 kg)   Comment LDL 143, triglycerides 161, cholesterol 221 No new labs; most recent labs show LDL 143, triglycerides 161, cholesterol 221 Unable to view lipid panel as it is outside epic; patient reports elevated cholesterol no new labs at this time. Patient will follow-up with cardiology in 11/2022. She has ongoing history of elevated lipids. lipids improved WNL- LDL 69, total cholesterol 127   Expected Outcome Sundeep reports concerns of GI discomfort including gas and bloating over the last month; she does have appointment with Gastroenterology in November. She also has history of IBS, GERD. She is down ~10# of unintentional weight loss since 09/21/2021 at 134#. In addition, per cardiology notes, she reports not tolerating statin medications and declines PCSK9 intervention. We discussed strategies for weight maintenance and weight gain; she is amicable  to introducing 1 nutrition supplements such as Boost or Ensure daily. She reports "grazing"/snacking behaviors throughout the day and often eats out for dinner. Goals in progress. Zamia continues 1 nutrition supplement daily (220-250kcals, 9-16g protein)  to aid with unintentional weight loss with ongoing reports of gas and bloating. She is up 2.6# since starting with our program. She cancelled her appointment with GI for this month; stressed the importanc of re-scheduling appointment with GI. She continues follow-up with cardiology for A-fib, dyslipidemia, CAD; she does not tolerate statins and declines PCSK9 intervention per most recent follow-up on 11/2. Goals in progress. Canna has follow-up appointments with both GI and cardiology in January 2024. Unwanted weight loss has subsided and she is up 3.3# since starting with our program. She reports her cholesterol remains elevated >36m/ dL; we discussed dietary intervention for heart health including high fiber foods and portions, reducing sodium intake <2300-20079mday, reducing saturated fat intake (breakfast meats, processed meats,etc), and omega 3 rich foods. She continues to eat on a regular schedule and reports no change in appetite. She does eat out frequently.  Goals in progress. Rieley saw GI today; was recommended to start bentyl PRN and metamucil daily. She did decline colonoscopy. She is up 5# since starting with our progam. Patient has been educated on multiple strategies for weight gain and heart healthy eating habits. She does continue to choose take-out/ eat out regularly. Tywana will continue to benefit from participating in pulmonary rehab for exercise, nutrition education, and lifestyle modification. Goals in progress. Most recent labs show improved cholesterol with total cholesterol down to 127 and LDL down to 69 since starting fenobibrate and zetia in October 2023. She continues follow-up with GI regarding ongoing GI symptoms. She does continue  to eat out or choose take-out regularly. She is up 4.4# since starting with our program. Alicyn will continue to benefit from participation in pulmonary rehab for nutrition, exercise, and lifestyle modification.            Nutrition Goals Discharge (Final Nutrition Goals Re-Evaluation):  Nutrition Goals Re-Evaluation - 11/20/22 1058       Goals   Current Weight 127 lb 3.3 oz (57.7 kg)    Comment lipids improved WNL- LDL 69, total cholesterol 127    Expected Outcome Goals in progress. Most recent labs show improved cholesterol with total cholesterol down to 127 and LDL down to 69 since starting fenobibrate and zetia in October 2023. She continues follow-up with GI regarding ongoing GI symptoms. She does continue to eat out or choose take-out regularly. She is up 4.4# since starting with our program. Tilly will continue to benefit from participation in pulmonary rehab for nutrition, exercise, and lifestyle modification.             Psychosocial: Target Goals: Acknowledge presence or absence of significant depression and/or stress, maximize coping skills, provide positive support system. Participant is able to verbalize types and ability to use techniques and skills needed for reducing stress and depression.  Initial Review & Psychosocial Screening:  Initial Psych Review & Screening - 07/27/22 0920       Initial Review   Current issues with None Identified    Comments Lives w/ son. Does not report psychosocial barriers to exercise.      Family Dynamics   Good Support System? Yes    Comments son      Barriers   Psychosocial barriers to participate in program There are no identifiable barriers or psychosocial needs.      Screening Interventions   Interventions Encouraged to exercise             Quality of Life Scores:  Scores of 19 and below usually indicate a poorer quality of life in these areas.  A difference of  2-3 points is a clinically meaningful difference.  A  difference of 2-3 points in the total score of the Quality of Life Index has been associated with significant improvement in overall quality of life, self-image, physical symptoms, and general health in studies assessing change in quality of life.  PHQ-9: Review Flowsheet       07/27/2022  Depression screen PHQ 2/9  Decreased Interest 0  Down, Depressed, Hopeless 0  PHQ - 2 Score 0  Altered sleeping 0  Tired, decreased energy 0  Change in appetite 0  Feeling bad or failure about yourself  0  Trouble concentrating 0  Moving slowly or fidgety/restless 0  Suicidal thoughts 0  PHQ-9 Score 0  Difficult doing work/chores Not difficult at all   Interpretation of Total Score  Total Score Depression Severity:  1-4 =  Minimal depression, 5-9 = Mild depression, 10-14 = Moderate depression, 15-19 = Moderately severe depression, 20-27 = Severe depression   Psychosocial Evaluation and Intervention:  Psychosocial Evaluation - 07/27/22 0923       Psychosocial Evaluation & Interventions   Interventions Encouraged to exercise with the program and follow exercise prescription    Comments Sharlie does not report any psychosocial concerns.    Expected Outcomes For Myleka to participate in Pulmonary Rehab free of psychosocial concerns.    Continue Psychosocial Services  No Follow up required             Psychosocial Re-Evaluation:  Psychosocial Re-Evaluation     [place] Name 08/10/22 [phone] 09/05/22 0725 10/02/22 1603 11/05/22 1313 11/27/22 1606     Psychosocial Re-Evaluation   Current issues with None Identified None Identified None Identified None Identified None Identified   Comments Khi has been participating in rehab free of psychosocial concerns. She feels that the program has helped improved her conditioning level. Darissa is still participating in rehab free of psychosocial concerns. She enjoys the group exercise setting as she converses with other patients. Grisel has been attending the  pulmonary rehab program without any psychosocial barriers. On her days off, she relaxes by reading. Hassana has been attending the pulmonary rehab program without any psychosocial barriers. She enjoys coming to class and participating. Alaizah has been attending the pulmonary rehab program without any psychosocial barriers. She enjoys coming to class and participating.   Expected Outcomes For Ceylin to participate in rehab free of psychosocial concerns. For Sausha to participate in rehab free of psychosocial concerns. Carrie-Ann will continue to attend pulmonary rehab without any psychosocial barriers. Fleurette will continue to attend pulmonary rehab without any psychosocial barriers. Alinea will continue to attend pulmonary rehab without any psychosocial barriers.   Interventions Encouraged to attend Pulmonary Rehabilitation for the exercise Encouraged to attend Pulmonary Rehabilitation for the exercise Encouraged to attend Pulmonary Rehabilitation for the exercise Encouraged to attend Pulmonary Rehabilitation for the exercise Encouraged to attend Pulmonary Rehabilitation for the exercise   Continue Psychosocial Services  No Follow up required No Follow up required No Follow up required No Follow up required No Follow up required            Psychosocial Discharge (Final Psychosocial Re-Evaluation):  Psychosocial Re-Evaluation - 11/27/22 1606       Psychosocial Re-Evaluation   Current issues with None Identified    Comments Aritza has been attending the pulmonary rehab program without any psychosocial barriers. She enjoys coming to class and participating.    Expected Outcomes Inis will continue to attend pulmonary rehab without any psychosocial barriers.    Interventions Encouraged to attend Pulmonary Rehabilitation for the exercise    Continue Psychosocial Services  No Follow up required             Education: Education Goals: Education classes will be provided on a weekly basis, covering required  topics. Participant will state understanding/return demonstration of topics presented.  Learning Barriers/Preferences:  Learning Barriers/Preferences - 07/27/22 0924       Learning Barriers/Preferences   Learning Barriers Sight   Bilat eye surgery   Learning Preferences None             Education Topics: Introduction to Pulmonary Rehab Group instruction provided by PowerPoint, verbal discussion, and written material to support subject matter. Instructor reviews what Pulmonary Rehab is, the purpose of the program, and how patients are referred.     Know Your Numbers Group  instruction that is supported by a PowerPoint presentation. Instructor discusses importance of knowing and understanding resting, exercise, and post-exercise oxygen saturation, heart rate, and blood pressure. Oxygen saturation, heart rate, blood pressure, rating of perceived exertion, and dyspnea are reviewed along with a normal range for these values.  Flowsheet Row PULMONARY REHAB OTHER RESPIRATORY from 11/08/2022 in [facility] for Heart, Vascular, & Lung Health  Instruction Review Code --  Neoma Laming attended this session]       Exercise for the Pulmonary Patient Group instruction that is supported by a PowerPoint presentation. Instructor discusses benefits of exercise, core components of exercise, frequency, duration, and intensity of an exercise routine, importance of utilizing pulse oximetry during exercise, safety while exercising, and options of places to exercise outside of rehab.       MET Level  Group instruction provided by PowerPoint, verbal discussion, and written material to support subject matter. Instructor reviews what METs are and how to increase METs.    Pulmonary Medications Verbally interactive group education provided by instructor with focus on inhaled medications and proper administration. Flowsheet Row PULMONARY REHAB OTHER RESPIRATORY from 11/01/2022 in [facility] for Heart, Vascular, & [place]  Date 11/01/22  Educator EP  Instruction Review Code 1- Verbalizes Understanding       Anatomy and Physiology of the Respiratory System Group instruction provided by PowerPoint, verbal discussion, and written material to support subject matter. Instructor reviews respiratory cycle and anatomical components of the respiratory system and their functions. Instructor also reviews differences in obstructive and restrictive respiratory diseases with examples of each.  Flowsheet Row PULMONARY REHAB OTHER RESPIRATORY from 10/04/2022 in [facility] for Heart, Vascular, & Lung Health  Date 08/02/22  Educator EP  Instruction Review Code 1- Verbalizes Understanding       Oxygen Safety Group instruction provided by PowerPoint, verbal discussion, and written material to support subject matter. There is an overview of "What is Oxygen" and "Why do we need it".  Instructor also reviews how to create a safe environment for oxygen use, the importance of using oxygen as prescribed, and the risks of noncompliance. There is a brief discussion on traveling with oxygen and resources the patient may utilize. Flowsheet Row PULMONARY REHAB OTHER RESPIRATORY from 10/04/2022 in [facility] for Heart, Vascular, & Lung Health  Date 08/30/22  Educator EP  Instruction Review Code 1- Verbalizes Understanding       Oxygen Use Group instruction provided by PowerPoint, verbal discussion, and written material to discuss how supplemental oxygen is prescribed and different types of oxygen supply systems. Resources for more information are provided.  Flowsheet Row PULMONARY REHAB OTHER RESPIRATORY from 11/15/2022 in [facility] for Heart, Vascular, & Lung Health  Instruction Review Code --  Neoma Laming attended edu on this topic]       Breathing Techniques Group instruction that is  supported by demonstration and informational handouts. Instructor discusses the benefits of pursed lip and diaphragmatic breathing and detailed demonstration on how to perform both.  Flowsheet Row PULMONARY REHAB OTHER RESPIRATORY from 10/04/2022 in [facility] for Heart, Vascular, & Lung Health  Date 09/20/22  Educator EP  Instruction Review Code 1- Verbalizes Understanding        Risk Factor Reduction Group instruction that is supported by a PowerPoint presentation. Instructor discusses the definition of a risk factor, different risk factors for pulmonary disease, and how the heart and  lungs work together. Flowsheet Row PULMONARY REHAB OTHER RESPIRATORY from 10/04/2022 in [facility] for Heart, Vascular, & [place]  Date 10/04/22  Educator EP  Instruction Review Code 1- Verbalizes Understanding       MD Day A group question and answer session with a medical doctor that allows participants to ask questions that relate to their pulmonary disease state.   Nutrition for the Pulmonary Patient Group instruction provided by PowerPoint slides, verbal discussion, and written materials to support subject matter. The instructor gives an explanation and review of healthy diet recommendations, which includes a discussion on weight management, recommendations for fruit and vegetable consumption, as well as protein, fluid, caffeine, fiber, sodium, sugar, and alcohol. Tips for eating when patients are short of breath are discussed.    Other Education Group or individual verbal, written, or video instructions that support the educational goals of the pulmonary rehab program. Flowsheet Row PULMONARY REHAB OTHER RESPIRATORY from 10/04/2022 in [facility] for Heart, Vascular, & Lung Health  Date 09/27/22  Educator Donnetta Simpers  Instruction Review Code 1- Verbalizes Understanding        Knowledge Questionnaire Score:  Knowledge  Questionnaire Score - 11/07/22 [id number]       Knowledge Questionnaire Score   Post Score 17/18             Core Components/Risk Factors/Patient Goals at Admission:  Personal Goals and Risk Factors at Admission - 07/27/22 0925       Core Components/Risk Factors/Patient Goals on Admission   Improve shortness of breath with ADL's Yes    Intervention Provide education, individualized exercise plan and daily activity instruction to help decrease symptoms of SOB with activities of daily living.    Expected Outcomes Short Term: Improve cardiorespiratory fitness to achieve a reduction of symptoms when performing ADLs;Long Term: Be able to perform more ADLs without symptoms or delay the onset of symptoms    Increase knowledge of respiratory medications and ability to use respiratory devices properly  Yes    Intervention Provide education and demonstration as needed of appropriate use of medications, inhalers, and oxygen therapy.    Expected Outcomes Short Term: Achieves understanding of medications use. Understands that oxygen is a medication prescribed by physician. Demonstrates appropriate use of inhaler and oxygen therapy.;Long Term: Maintain appropriate use of medications, inhalers, and oxygen therapy.             Core Components/Risk Factors/Patient Goals Review:   Goals and Risk Factor Review     Row Name 08/10/22 0645 09/05/22 0726 10/02/22 1610 11/05/22 1315 11/27/22 1607     Core Components/Risk Factors/Patient Goals Review   Personal Goals Review Improve shortness of breath with ADL's;Develop more efficient breathing techniques such as purse lipped breathing and diaphragmatic breathing and practicing self-pacing with activity.;Increase knowledge of respiratory medications and ability to use respiratory devices properly. Improve shortness of breath with ADL's;Develop more efficient breathing techniques such as purse lipped breathing and diaphragmatic breathing and practicing  self-pacing with activity.;Increase knowledge of respiratory medications and ability to use respiratory devices properly. Improve shortness of breath with ADL's;Develop more efficient breathing techniques such as purse lipped breathing and diaphragmatic breathing and practicing self-pacing with activity. Improve shortness of breath with ADL's;Develop more efficient breathing techniques such as purse lipped breathing and diaphragmatic breathing and practicing self-pacing with activity. Improve shortness of breath with ADL's;Develop more efficient breathing techniques such as purse lipped breathing and diaphragmatic breathing and practicing self-pacing with activity.  Review Kessler has completed 3 exercise sessions. She has increased her METs on the recumbent bike and Nustep. She feels that her conditioning level has improved since starting rehab. Bertice has currently learned more about her disease. Will review medication later in the program. Will continue assess. Sarine has completed 9 exercise sessions. She has increased her METs on the recumbent bike and Nustep. She feels that her conditioning level has improved since starting rehab. Deangela was given resources to learn more about her pulmonary disease. She feels this benefitted her. Will continue to assess. Clairice has completed 15 exercise sessions. She is enjoying working out on the recumbent bike and the NuStep while conversing with new friends in the class. She is increasing her workload and METs while maintaining her oxygenation on room air. Magalene has attended education class about pursed lip and diaphragmatic breathing. Emyla has completed 20 exercise classes. She enjoys working out on the recumbent bike and the NuStep. She is currently maintaining her METs but has increased her workload on the equipment. She knows how to report her Rate of Perceived Exertion and her Dyspnea scale to staff. Elaysia has attended education classes about pursed lip and diaphragmatic  breathing, anatomy and physiology of the lungs, pulmonary medications, and stress and energy conservation. Seta has completed 27 exercise classes. She enjoys working out on the recumbent bike and the NuStep. She is currently maintaining her METs but has increased her workload on the equipment. She knows how to report her Rate of Perceived Exertion and her Dyspnea scale to staff. Rever has attended education classes about breathing techniques, anatomy and physiology of the lungs, pulmonary medications, know your numbers, oxygen safety, oxygen use, and stress and energy conservation. Karuna enjoys coming to [place] and wants to continue exercising at her local senior center when she graduates.   Expected Outcomes See admission goals. See admission goals. See admission goals See admission goals See admission goals            Core Components/Risk Factors/Patient Goals at Discharge (Final Review):   Goals and Risk Factor Review - 11/27/22 1607       Core Components/Risk Factors/Patient Goals Review   Personal Goals Review Improve shortness of breath with ADL's;Develop more efficient breathing techniques such as purse lipped breathing and diaphragmatic breathing and practicing self-pacing with activity.    Review Jaquelinne has completed 27 exercise classes. She enjoys working out on the recumbent bike and the NuStep. She is currently maintaining her METs but has increased her workload on the equipment. She knows how to report her Rate of Perceived Exertion and her Dyspnea scale to staff. Lilu has attended education classes about breathing techniques, anatomy and physiology of the lungs, pulmonary medications, know your numbers, oxygen safety, oxygen use, and stress and energy conservation. Noelani enjoys coming to [place] and wants to continue exercising at her local senior center when she graduates.    Expected Outcomes See admission goals             ITP Comments:   Comments: Dr. Rodman Pickle is Medical  Director for Pulmonary Rehab at [facility].

## 2022-12-06 ENCOUNTER — Encounter (HOSPITAL_COMMUNITY)
Admission: RE | Admit: 2022-12-06 | Discharge: 2022-12-06 | Disposition: A | Discharge status: Home / Self Care | Payer: PPO | Source: Ambulatory Visit | Attending: Pulmonary Disease | Admitting: Pulmonary Disease

## 2022-12-06 DIAGNOSIS — R21 Rash and other nonspecific skin eruption: Secondary | ICD-10-CM | POA: Diagnosis not present

## 2022-12-06 DIAGNOSIS — J849 Interstitial pulmonary disease, unspecified: Secondary | ICD-10-CM

## 2022-12-06 DIAGNOSIS — M3390 Dermatopolymyositis, unspecified, organ involvement unspecified: Secondary | ICD-10-CM | POA: Diagnosis not present

## 2022-12-06 DIAGNOSIS — M3500 Sicca syndrome, unspecified: Secondary | ICD-10-CM | POA: Diagnosis not present

## 2022-12-06 DIAGNOSIS — R682 Dry mouth, unspecified: Secondary | ICD-10-CM | POA: Diagnosis not present

## 2022-12-06 DIAGNOSIS — M810 Age-related osteoporosis without current pathological fracture: Secondary | ICD-10-CM | POA: Diagnosis not present

## 2022-12-06 DIAGNOSIS — R5383 Other fatigue: Secondary | ICD-10-CM | POA: Diagnosis not present

## 2022-12-06 DIAGNOSIS — R634 Abnormal weight loss: Secondary | ICD-10-CM | POA: Diagnosis not present

## 2022-12-06 DIAGNOSIS — H04129 Dry eye syndrome of unspecified lacrimal gland: Secondary | ICD-10-CM | POA: Diagnosis not present

## 2022-12-06 DIAGNOSIS — Z79899 Other long term (current) drug therapy: Secondary | ICD-10-CM | POA: Diagnosis not present

## 2022-12-06 NOTE — Progress Notes (Signed)
Daily Session Note  Patient Details  Name: Susan Bray MRN: [id number] Date of Birth: 1946-02-21 Referring Provider:   [name] Pulmonary Rehab Walk Test from 07/27/2022 in [facility] for Heart, Vascular, & Lung Health  Referring Provider [name]       Encounter Date: 12/06/2022  Check In:  Session Check In - 12/06/22 1037       Check-In   Supervising physician immediately available to respond to emergencies [facility] MD immediately available    Physician(s) Dr Nila Nephew    Location [facility]    Staff Present Janine Ores, RN, Quentin Ore, MS, ACSM-CEP, Exercise Physiologist;Samantha [place], RD, LDN;Randi [facility], ACSM-CEP, Exercise Physiologist;Other    Virtual Visit No    Medication changes reported     No    Fall or balance concerns reported    No    Tobacco Cessation No Change    Warm-up and Cool-down Performed as group-led instruction    Resistance Training Performed Yes    VAD Patient? No    PAD/SET Patient? No      Pain Assessment   Currently in Pain? No/denies    Multiple Pain Sites No             Capillary Blood Glucose: No results found for this or any previous visit (from the past 24 hour(s)).    Social History   Tobacco Use  Smoking Status Never  Smokeless Tobacco Never    Goals Met:  Proper associated with RPD/PD & O2 Sat Exercise tolerated well No report of concerns or symptoms today Strength training completed today  Goals Unmet:  Not Applicable  Comments: Service time is from 1014 to 1138.    Dr. Rodman Pickle is Medical Director for Pulmonary Rehab at [facility].

## 2022-12-10 DIAGNOSIS — D692 Other nonthrombocytopenic purpura: Secondary | ICD-10-CM | POA: Diagnosis not present

## 2022-12-10 DIAGNOSIS — L821 Other seborrheic keratosis: Secondary | ICD-10-CM | POA: Diagnosis not present

## 2022-12-11 ENCOUNTER — Encounter (HOSPITAL_COMMUNITY)
Admission: RE | Admit: 2022-12-11 | Discharge: 2022-12-11 | Disposition: A | Discharge status: Home / Self Care | Payer: PPO | Source: Ambulatory Visit | Attending: Pulmonary Disease | Admitting: Pulmonary Disease

## 2022-12-11 DIAGNOSIS — R002 Palpitations: Secondary | ICD-10-CM

## 2022-12-11 DIAGNOSIS — J849 Interstitial pulmonary disease, unspecified: Secondary | ICD-10-CM | POA: Diagnosis not present

## 2022-12-11 NOTE — Progress Notes (Signed)
Daily Session Note  Patient Details  Name: Susan Bray MRN: [id number] Date of Birth: 01/22/1946 Referring Provider:   [name] Pulmonary Rehab Walk Test from 07/27/2022 in [facility] for Heart, Vascular, & [place]  Referring Provider [name]       Encounter Date: 12/11/2022  Check In:  Session Check In - 12/11/22 1151       Check-In   Supervising physician immediately available to respond to emergencies [facility] MD immediately available    Physician(s) Eric Form, NP    Location [facility]    Staff Present Janine Ores, RN, Quentin Ore, MS, ACSM-CEP, Exercise Physiologist;Randi Yevonne Pax, ACSM-CEP, Exercise Physiologist;Samantha [place], RD, LDN;Other    Virtual Visit No    Medication changes reported     No    Fall or balance concerns reported    No    Tobacco Cessation No Change    Warm-up and Cool-down Performed as group-led instruction    Resistance Training Performed Yes    VAD Patient? No    PAD/SET Patient? No      Pain Assessment   Currently in Pain? No/denies    Multiple Pain Sites No             Capillary Blood Glucose: No results found for this or any previous visit (from the past 24 hour(s)).    Social History   Tobacco Use  Smoking Status Never  Smokeless Tobacco Never    Goals Met:  Proper associated with RPD/PD & O2 Sat Independence with exercise equipment Exercise tolerated well No report of concerns or symptoms today Strength training completed today  Goals Unmet:  Not Applicable  Comments: Service time is from 1012 to 1140.    Dr. Rodman Pickle is Medical Director for Pulmonary Rehab at [facility].

## 2022-12-13 ENCOUNTER — Encounter (HOSPITAL_COMMUNITY)
Admission: RE | Admit: 2022-12-13 | Discharge: 2022-12-13 | Disposition: A | Discharge status: Home / Self Care | Payer: PPO | Source: Ambulatory Visit | Attending: Pulmonary Disease | Admitting: Pulmonary Disease

## 2022-12-13 DIAGNOSIS — J849 Interstitial pulmonary disease, unspecified: Secondary | ICD-10-CM | POA: Diagnosis not present

## 2022-12-13 NOTE — Progress Notes (Signed)
Daily Session Note  Patient Details  Name: Susan Bray MRN: [id number] Date of Birth: [birth year] Referring Provider:   [name] Pulmonary Rehab Walk Test from 07/27/2022 in [facility] for Heart, Vascular, & Lung Health  Referring Provider [name]       Encounter Date: 12/13/2022  Check In:  Session Check In - 12/13/22 1153       Check-In   Supervising physician immediately available to respond to emergencies [facility] MD immediately available    Physician(s) Dr Vaughan Browner    Location [facility]    Staff Present Janine Ores, RN, Quentin Ore, MS, ACSM-CEP, Exercise Physiologist; Yevonne Pax, ACSM-CEP, Exercise Physiologist;Samantha [place], RD, LDN;Other    Virtual Visit No    Medication changes reported     No    Fall or balance concerns reported    No    Tobacco Cessation No Change    Warm-up and Cool-down Performed as group-led instruction    Resistance Training Performed Yes    VAD Patient? No    PAD/SET Patient? No      Pain Assessment   Currently in Pain? No/denies    Multiple Pain Sites No             Capillary Blood Glucose: No results found for this or any previous visit (from the past 24 hour(s)).    Social History   Tobacco Use  Smoking Status Never  Smokeless Tobacco Never    Goals Met:  Independence with exercise equipment Exercise tolerated well No report of concerns or symptoms today Strength training completed today  Goals Unmet:  Not Applicable  Comments: Service time is from 1012 to 1140.    Dr. Rodman Pickle is Medical Director for Pulmonary Rehab at [facility].

## 2022-12-18 ENCOUNTER — Encounter (HOSPITAL_COMMUNITY)
Admission: RE | Admit: 2022-12-18 | Discharge: 2022-12-18 | Disposition: A | Discharge status: Home / Self Care | Payer: PPO | Source: Ambulatory Visit | Attending: Pulmonary Disease | Admitting: Pulmonary Disease

## 2022-12-18 DIAGNOSIS — J849 Interstitial pulmonary disease, unspecified: Secondary | ICD-10-CM

## 2022-12-18 NOTE — Progress Notes (Signed)
Daily Session Note  Patient Details  Name: Susan Bray MRN: [id number] Date of Birth: 1946-03-15 Referring Provider:   [name] Pulmonary Rehab Walk Test from 07/27/2022 in [facility] for Heart, Vascular, & [place]  Referring Provider [name]       Encounter Date: 12/18/2022  Check In:  Session Check In - 12/18/22 1033       Check-In   Supervising physician immediately available to respond to emergencies [facility] MD immediately available    Physician(s) Melina Copa, PA    Location [facility]    Staff Present Janine Ores, RN, Quentin Ore, MS, ACSM-CEP, Exercise Physiologist;Randi Yevonne Pax, ACSM-CEP, Exercise Physiologist;Samantha [place], RD, LDN;Other;Bailey Pearline Cables, MS, Exercise Physiologist    Virtual Visit No    Medication changes reported     No    Fall or balance concerns reported    No    Tobacco Cessation No Change    Warm-up and Cool-down Not performed (comment)    Resistance Training Performed No    VAD Patient? No    PAD/SET Patient? No      Pain Assessment   Currently in Pain? No/denies    Multiple Pain Sites No             Capillary Blood Glucose: No results found for this or any previous visit (from the past 24 hour(s)).    Social History   Tobacco Use  Smoking Status Never  Smokeless Tobacco Never    Goals Met:  Proper associated with RPD/PD & O2 Sat Exercise tolerated well No report of concerns or symptoms today  Goals Unmet:  Not Applicable  Comments: Service time is from 0950 to 1001.    Dr. Rodman Pickle is Medical Director for Pulmonary Rehab at [facility].

## 2022-12-19 NOTE — Progress Notes (Signed)
Discharge Progress Report  Patient Details  Name: Susan Bray MRN: [id number] Date of Birth: [birth year] Referring Provider:   [name] Pulmonary Rehab Walk Test from 07/27/2022 in [facility]  Referring Provider [name]        Number of Visits: 12  Reason for Discharge:  Patient reached a stable level of exercise. Patient independent in their exercise. Patient has met program and personal goals.  Smoking History:  Social History   Tobacco Use  Smoking Status Never  Smokeless Tobacco Never    Diagnosis:  ILD (interstitial lung disease) ([place])  ADL [facility]:  Pulmonary Assessment Scores     Row Name 07/27/22 1102 11/07/22 0644 11/07/22 0650     ADL [facility]   ADL Phase Entry Exit --   SOB Score total 53 59 --     CAT Score   CAT Score 14 15 --     mMRC Score   mMRC Score 3 -- --    Row Name 12/18/22 1543         mMRC Score   mMRC Score 4              Initial Exercise Prescription:  Initial Exercise Prescription - 07/27/22 1100       Date of Initial Exercise RX and Referring Provider   Date 07/27/22    Referring Provider [name]    Expected Discharge Date 10/04/22      Recumbant Bike   Level 1    Minutes 15    METs 2      NuStep   Level 1    SPM 60    Minutes 15    METs 1.8      Prescription Details   Frequency (times per week) 2    Duration Progress to 30 minutes of continuous aerobic without signs/symptoms of physical distress      Intensity   THRR 40-80% of Max Heartrate 58-117    Ratings of Perceived Exertion 11-13    Perceived Dyspnea 0-4      Progression   Progression Continue progressive overload as per policy without signs/symptoms or physical distress.      Resistance Training   Training Prescription Yes    Weight red bands    Reps 10-15             Discharge Exercise Prescription (Final Exercise Prescription Changes):  Exercise Prescription Changes -  12/04/22 1200       Response to Exercise   Blood Pressure (Admit) 122/62    Blood Pressure (Exercise) 138/60    Blood Pressure (Exit) 128/60    Heart Rate (Admit) 77 bpm    Heart Rate (Exercise) 103 bpm    Heart Rate (Exit) 77 bpm    Oxygen Saturation (Admit) 97 %    Oxygen Saturation (Exercise) 97 %    Oxygen Saturation (Exit) 98 %    Rating of Perceived Exertion (Exercise) 11    Perceived Dyspnea (Exercise) 1    Duration Continue with 30 min of aerobic exercise without signs/symptoms of physical distress.    Intensity THRR unchanged      Progression   Progression Continue to progress workloads to maintain intensity without signs/symptoms of physical distress.      Resistance Training   Training Prescription Yes    Weight blue bands    Reps 10-15    Time 10 Minutes      Recumbant Bike   Level 5.5  Minutes 15    METs 2.9      NuStep   Level 5    SPM 70    Minutes 15    METs 2.7             Functional Capacity:  6 Minute Walk     Row Name 07/27/22 1105 12/18/22 1540       6 Minute Walk   Phase Initial Discharge    Distance 1005 feet 1450 feet    Distance % Change -- 44.28 %    Distance Feet Change -- 445 ft    Walk Time 6 minutes 6 minutes    # of Rest Breaks 0 0    MPH 3.07 2.75    METS 3.35 3.26    RPE 13 11    Perceived Dyspnea  3 1    VO2 Peak 11.71 11.42    Symptoms No No    Resting HR 69 bpm 74 bpm    Resting BP 120/60 108/58    Resting Oxygen Saturation  97 % 98 %    Exercise Oxygen Saturation  during 6 min walk 93 % 98 %    Max Ex. HR 80 bpm 113 bpm    Max Ex. BP 138/60 134/60    2 Minute Post BP 124/60 128/58      Interval HR   1 Minute HR 77 93    2 Minute HR 80 98    3 Minute HR 79 102    4 Minute HR 76 102    5 Minute HR 80 113    6 Minute HR 78 111    2 Minute Post HR 64 79    Interval Heart Rate? Yes Yes      Interval Oxygen   Interval Oxygen? Yes Yes    Baseline Oxygen Saturation % 97 % 98 %    1 Minute Oxygen  Saturation % 97 % 100 %    1 Minute Liters of Oxygen 0 L 0 L    2 Minute Oxygen Saturation % 99 % 99 %    2 Minute Liters of Oxygen 0 L 0 L    3 Minute Oxygen Saturation % 93 % 99 %    3 Minute Liters of Oxygen 0 L 0 L    4 Minute Oxygen Saturation % 98 % 99 %    4 Minute Liters of Oxygen 0 L 0 L    5 Minute Oxygen Saturation % 96 % 98 %    5 Minute Liters of Oxygen 0 L 0 L    6 Minute Oxygen Saturation % 97 % 99 %    6 Minute Liters of Oxygen 0 L 0 L    2 Minute Post Oxygen Saturation % 100 % 100 %    2 Minute Post Liters of Oxygen 64 L 0 L             Psychological, QOL, Others - Outcomes: PHQ 2/9:    07/27/2022    9:19 AM  Depression screen PHQ 2/9  Decreased Interest 0  Down, Depressed, Hopeless 0  PHQ - 2 Score 0  Altered sleeping 0  Tired, decreased energy 0  Change in appetite 0  Feeling bad or failure about yourself  0  Trouble concentrating 0  Moving slowly or fidgety/restless 0  Suicidal thoughts 0  PHQ-9 Score 0  Difficult doing work/chores Not difficult at all    Quality of Life:   Personal Goals: Goals  established at orientation with interventions provided to work toward goal.  Personal Goals and Risk Factors at Admission - 07/27/22 0925       Core Components/Risk Factors/Patient Goals on Admission   Improve shortness of breath with ADL's Yes    Intervention Provide education, individualized exercise plan and daily activity instruction to help decrease symptoms of SOB with activities of daily living.    Expected Outcomes Short Term: Improve cardiorespiratory fitness to achieve a reduction of symptoms when performing ADLs;Long Term: Be able to perform more ADLs without symptoms or delay the onset of symptoms    Increase knowledge of respiratory medications and ability to use respiratory devices properly  Yes    Intervention Provide education and demonstration as needed of appropriate use of medications, inhalers, and oxygen therapy.    Expected  Outcomes Short Term: Achieves understanding of medications use. Understands that oxygen is a medication prescribed by physician. Demonstrates appropriate use of inhaler and oxygen therapy.;Long Term: Maintain appropriate use of medications, inhalers, and oxygen therapy.              Personal Goals Discharge:  Goals and Risk Factor Review     Row Name 08/10/22 0645 09/05/22 0726 10/02/22 1610 11/05/22 1315 11/27/22 1607     Core Components/Risk Factors/Patient Goals Review   Personal Goals Review Improve shortness of breath with ADL's;Develop more efficient breathing techniques such as purse lipped breathing and diaphragmatic breathing and practicing self-pacing with activity.;Increase knowledge of respiratory medications and ability to use respiratory devices properly. Improve shortness of breath with ADL's;Develop more efficient breathing techniques such as purse lipped breathing and diaphragmatic breathing and practicing self-pacing with activity.;Increase knowledge of respiratory medications and ability to use respiratory devices properly. Improve shortness of breath with ADL's;Develop more efficient breathing techniques such as purse lipped breathing and diaphragmatic breathing and practicing self-pacing with activity. Improve shortness of breath with ADL's;Develop more efficient breathing techniques such as purse lipped breathing and diaphragmatic breathing and practicing self-pacing with activity. Improve shortness of breath with ADL's;Develop more efficient breathing techniques such as purse lipped breathing and diaphragmatic breathing and practicing self-pacing with activity.   Review Shanny has completed 3 exercise sessions. She has increased her METs on the recumbent bike and Nustep. She feels that her conditioning level has improved since starting rehab. Shaqueena has currently learned more about her disease. Will review medication later in the program. Will continue assess. Rashea has completed  9 exercise sessions. She has increased her METs on the recumbent bike and Nustep. She feels that her conditioning level has improved since starting rehab. Channon was given resources to learn more about her pulmonary disease. She feels this benefitted her. Will continue to assess. Zella has completed 15 exercise sessions. She is enjoying working out on the recumbent bike and the NuStep while conversing with new friends in the class. She is increasing her workload and METs while maintaining her oxygenation on room air. Nikhila has attended education class about pursed lip and diaphragmatic breathing. Jerriyah has completed 20 exercise classes. She enjoys working out on the recumbent bike and the NuStep. She is currently maintaining her METs but has increased her workload on the equipment. She knows how to report her Rate of Perceived Exertion and her Dyspnea scale to staff. Xana has attended education classes about pursed lip and diaphragmatic breathing, anatomy and physiology of the lungs, pulmonary medications, and stress and energy conservation. Icie has completed 27 exercise classes. She enjoys working out on the recumbent bike and  the NuStep. She is currently maintaining her METs but has increased her workload on the equipment. She knows how to report her Rate of Perceived Exertion and her Dyspnea scale to staff. Alirah has attended education classes about breathing techniques, anatomy and physiology of the lungs, pulmonary medications, know your numbers, oxygen safety, oxygen use, and stress and energy conservation. Dandrea enjoys coming to [place] and wants to continue exercising at her local senior center when she graduates.   Expected Outcomes See admission goals. See admission goals. See admission goals See admission goals See admission goals            Exercise Goals and Review:  Exercise Goals     Row Name 07/27/22 0926 08/06/22 1158 09/05/22 0723 10/03/22 0728 11/06/22 0915     Exercise Goals    Increase Physical Activity Yes Yes Yes Yes Yes   Intervention Provide advice, education, support and counseling about physical activity/exercise needs.;Develop an individualized exercise prescription for aerobic and resistive training based on initial evaluation findings, risk stratification, comorbidities and participant's personal goals. Provide advice, education, support and counseling about physical activity/exercise needs.;Develop an individualized exercise prescription for aerobic and resistive training based on initial evaluation findings, risk stratification, comorbidities and participant's personal goals. Provide advice, education, support and counseling about physical activity/exercise needs.;Develop an individualized exercise prescription for aerobic and resistive training based on initial evaluation findings, risk stratification, comorbidities and participant's personal goals. Provide advice, education, support and counseling about physical activity/exercise needs.;Develop an individualized exercise prescription for aerobic and resistive training based on initial evaluation findings, risk stratification, comorbidities and participant's personal goals. Provide advice, education, support and counseling about physical activity/exercise needs.;Develop an individualized exercise prescription for aerobic and resistive training based on initial evaluation findings, risk stratification, comorbidities and participant's personal goals.   Expected Outcomes Short Term: Attend rehab on a regular basis to increase amount of physical activity.;Long Term: Add in home exercise to make exercise part of routine and to increase amount of physical activity.;Long Term: Exercising regularly at least 3-5 days a week. Short Term: Attend rehab on a regular basis to increase amount of physical activity.;Long Term: Add in home exercise to make exercise part of routine and to increase amount of physical activity.;Long Term:  Exercising regularly at least 3-5 days a week. Short Term: Attend rehab on a regular basis to increase amount of physical activity.;Long Term: Add in home exercise to make exercise part of routine and to increase amount of physical activity.;Long Term: Exercising regularly at least 3-5 days a week. Short Term: Attend rehab on a regular basis to increase amount of physical activity.;Long Term: Add in home exercise to make exercise part of routine and to increase amount of physical activity.;Long Term: Exercising regularly at least 3-5 days a week. Short Term: Attend rehab on a regular basis to increase amount of physical activity.;Long Term: Add in home exercise to make exercise part of routine and to increase amount of physical activity.;Long Term: Exercising regularly at least 3-5 days a week.   Increase Strength and Stamina Yes Yes Yes Yes Yes   Intervention Provide advice, education, support and counseling about physical activity/exercise needs.;Develop an individualized exercise prescription for aerobic and resistive training based on initial evaluation findings, risk stratification, comorbidities and participant's personal goals. Provide advice, education, support and counseling about physical activity/exercise needs.;Develop an individualized exercise prescription for aerobic and resistive training based on initial evaluation findings, risk stratification, comorbidities and participant's personal goals. Provide advice, education, support and counseling about physical  activity/exercise needs.;Develop an individualized exercise prescription for aerobic and resistive training based on initial evaluation findings, risk stratification, comorbidities and participant's personal goals. Provide advice, education, support and counseling about physical activity/exercise needs.;Develop an individualized exercise prescription for aerobic and resistive training based on initial evaluation findings, risk stratification,  comorbidities and participant's personal goals. Provide advice, education, support and counseling about physical activity/exercise needs.;Develop an individualized exercise prescription for aerobic and resistive training based on initial evaluation findings, risk stratification, comorbidities and participant's personal goals.   Expected Outcomes Short Term: Increase workloads from initial exercise prescription for resistance, speed, and METs.;Short Term: Perform resistance training exercises routinely during rehab and add in resistance training at home;Long Term: Improve cardiorespiratory fitness, muscular endurance and strength as measured by increased METs and functional capacity (6MWT) Short Term: Increase workloads from initial exercise prescription for resistance, speed, and METs.;Short Term: Perform resistance training exercises routinely during rehab and add in resistance training at home;Long Term: Improve cardiorespiratory fitness, muscular endurance and strength as measured by increased METs and functional capacity (6MWT) Short Term: Increase workloads from initial exercise prescription for resistance, speed, and METs.;Short Term: Perform resistance training exercises routinely during rehab and add in resistance training at home;Long Term: Improve cardiorespiratory fitness, muscular endurance and strength as measured by increased METs and functional capacity (6MWT) Short Term: Increase workloads from initial exercise prescription for resistance, speed, and METs.;Short Term: Perform resistance training exercises routinely during rehab and add in resistance training at home;Long Term: Improve cardiorespiratory fitness, muscular endurance and strength as measured by increased METs and functional capacity (6MWT) Short Term: Increase workloads from initial exercise prescription for resistance, speed, and METs.;Short Term: Perform resistance training exercises routinely during rehab and add in resistance  training at home;Long Term: Improve cardiorespiratory fitness, muscular endurance and strength as measured by increased METs and functional capacity (6MWT)   Able to understand and use rate of perceived exertion (RPE) scale Yes Yes Yes Yes Yes   Intervention Provide education and explanation on how to use RPE scale Provide education and explanation on how to use RPE scale Provide education and explanation on how to use RPE scale Provide education and explanation on how to use RPE scale Provide education and explanation on how to use RPE scale   Expected Outcomes Short Term: Able to use RPE daily in rehab to express subjective intensity level;Long Term:  Able to use RPE to guide intensity level when exercising independently Short Term: Able to use RPE daily in rehab to express subjective intensity level;Long Term:  Able to use RPE to guide intensity level when exercising independently Short Term: Able to use RPE daily in rehab to express subjective intensity level;Long Term:  Able to use RPE to guide intensity level when exercising independently Short Term: Able to use RPE daily in rehab to express subjective intensity level;Long Term:  Able to use RPE to guide intensity level when exercising independently Short Term: Able to use RPE daily in rehab to express subjective intensity level;Long Term:  Able to use RPE to guide intensity level when exercising independently   Able to understand and use Dyspnea scale Yes Yes Yes Yes Yes   Intervention Provide education and explanation on how to use Dyspnea scale Provide education and explanation on how to use Dyspnea scale Provide education and explanation on how to use Dyspnea scale Provide education and explanation on how to use Dyspnea scale Provide education and explanation on how to use Dyspnea scale   Expected Outcomes Short Term: Able  to use Dyspnea scale daily in rehab to express subjective sense of shortness of breath during exertion;Long Term: Able to use  Dyspnea scale to guide intensity level when exercising independently Short Term: Able to use Dyspnea scale daily in rehab to express subjective sense of shortness of breath during exertion;Long Term: Able to use Dyspnea scale to guide intensity level when exercising independently Short Term: Able to use Dyspnea scale daily in rehab to express subjective sense of shortness of breath during exertion;Long Term: Able to use Dyspnea scale to guide intensity level when exercising independently Short Term: Able to use Dyspnea scale daily in rehab to express subjective sense of shortness of breath during exertion;Long Term: Able to use Dyspnea scale to guide intensity level when exercising independently Short Term: Able to use Dyspnea scale daily in rehab to express subjective sense of shortness of breath during exertion;Long Term: Able to use Dyspnea scale to guide intensity level when exercising independently   Knowledge and understanding of Target Heart Rate Range (THRR) Yes Yes Yes Yes Yes   Intervention Provide education and explanation of THRR including how the numbers were predicted and where they are located for reference Provide education and explanation of THRR including how the numbers were predicted and where they are located for reference Provide education and explanation of THRR including how the numbers were predicted and where they are located for reference Provide education and explanation of THRR including how the numbers were predicted and where they are located for reference Provide education and explanation of THRR including how the numbers were predicted and where they are located for reference   Expected Outcomes Short Term: Able to state/look up THRR;Long Term: Able to use THRR to govern intensity when exercising independently;Short Term: Able to use daily as guideline for intensity in rehab Short Term: Able to state/look up THRR;Long Term: Able to use THRR to govern intensity when exercising  independently;Short Term: Able to use daily as guideline for intensity in rehab Short Term: Able to state/look up THRR;Long Term: Able to use THRR to govern intensity when exercising independently;Short Term: Able to use daily as guideline for intensity in rehab Short Term: Able to state/look up THRR;Long Term: Able to use THRR to govern intensity when exercising independently;Short Term: Able to use daily as guideline for intensity in rehab Short Term: Able to state/look up THRR;Long Term: Able to use THRR to govern intensity when exercising independently;Short Term: Able to use daily as guideline for intensity in rehab   Understanding of Exercise Prescription Yes Yes Yes Yes Yes   Intervention Provide education, explanation, and written materials on patient's individual exercise prescription Provide education, explanation, and written materials on patient's individual exercise prescription Provide education, explanation, and written materials on patient's individual exercise prescription Provide education, explanation, and written materials on patient's individual exercise prescription Provide education, explanation, and written materials on patient's individual exercise prescription   Expected Outcomes Short Term: Able to explain program exercise prescription;Long Term: Able to explain home exercise prescription to exercise independently Short Term: Able to explain program exercise prescription;Long Term: Able to explain home exercise prescription to exercise independently Short Term: Able to explain program exercise prescription;Long Term: Able to explain home exercise prescription to exercise independently Short Term: Able to explain program exercise prescription;Long Term: Able to explain home exercise prescription to exercise independently Short Term: Able to explain program exercise prescription;Long Term: Able to explain home exercise prescription to exercise independently    [place] [address] Name 11/27/22 1556  Exercise Goals   Increase Physical Activity Yes       Intervention Provide advice, education, support and counseling about physical activity/exercise needs.;Develop an individualized exercise prescription for aerobic and resistive training based on initial evaluation findings, risk stratification, comorbidities and participant's personal goals.       Expected Outcomes Short Term: Attend rehab on a regular basis to increase amount of physical activity.;Long Term: Add in home exercise to make exercise part of routine and to increase amount of physical activity.;Long Term: Exercising regularly at least 3-5 days a week.       Increase Strength and Stamina Yes       Intervention Provide advice, education, support and counseling about physical activity/exercise needs.;Develop an individualized exercise prescription for aerobic and resistive training based on initial evaluation findings, risk stratification, comorbidities and participant's personal goals.       Expected Outcomes Short Term: Increase workloads from initial exercise prescription for resistance, speed, and METs.;Short Term: Perform resistance training exercises routinely during rehab and add in resistance training at home;Long Term: Improve cardiorespiratory fitness, muscular endurance and strength as measured by increased METs and functional capacity (6MWT)       Able to understand and use rate of perceived exertion (RPE) scale Yes       Intervention Provide education and explanation on how to use RPE scale       Expected Outcomes Short Term: Able to use RPE daily in rehab to express subjective intensity level;Long Term:  Able to use RPE to guide intensity level when exercising independently       Able to understand and use Dyspnea scale Yes       Intervention Provide education and explanation on how to use Dyspnea scale       Expected Outcomes Short Term: Able to use Dyspnea scale daily in rehab to express subjective sense of  shortness of breath during exertion;Long Term: Able to use Dyspnea scale to guide intensity level when exercising independently       Knowledge and understanding of Target Heart Rate Range (THRR) Yes       Intervention Provide education and explanation of THRR including how the numbers were predicted and where they are located for reference       Expected Outcomes Short Term: Able to state/look up THRR;Long Term: Able to use THRR to govern intensity when exercising independently;Short Term: Able to use daily as guideline for intensity in rehab       Understanding of Exercise Prescription Yes       Intervention Provide education, explanation, and written materials on patient's individual exercise prescription       Expected Outcomes Short Term: Able to explain program exercise prescription;Long Term: Able to explain home exercise prescription to exercise independently                Exercise Goals Re-Evaluation:  Exercise Goals Re-Evaluation     Row Name 08/06/22 1158 09/05/22 0723 10/03/22 0728 11/06/22 0915 11/27/22 1556     Exercise Goal Re-Evaluation   Exercise Goals Review Increase Physical Activity;Increase Strength and Stamina;Able to understand and use rate of perceived exertion (RPE) scale;Able to understand and use Dyspnea scale;Knowledge and understanding of Target Heart Rate Range (THRR);Understanding of Exercise Prescription Increase Physical Activity;Increase Strength and Stamina;Able to understand and use rate of perceived exertion (RPE) scale;Able to understand and use Dyspnea scale;Knowledge and understanding of Target Heart Rate Range (THRR);Understanding of Exercise Prescription Increase Physical Activity;Increase Strength and Stamina;Able to understand and use  rate of perceived exertion (RPE) scale;Able to understand and use Dyspnea scale;Knowledge and understanding of Target Heart Rate Range (THRR);Understanding of Exercise Prescription Increase Physical Activity;Increase  Strength and Stamina;Able to understand and use rate of perceived exertion (RPE) scale;Able to understand and use Dyspnea scale;Knowledge and understanding of Target Heart Rate Range (THRR);Understanding of Exercise Prescription Increase Physical Activity;Increase Strength and Stamina;Able to understand and use rate of perceived exertion (RPE) scale;Able to understand and use Dyspnea scale;Knowledge and understanding of Target Heart Rate Range (THRR);Understanding of Exercise Prescription   Comments Divinity has completed 1 exercise session. She exercises on the recumbent bike and Nustep for 15 min. She averages 1.9 METs at level 1 on the recumbent bike and 2.1 METs at level 1 on the Nustep. She performs the warmup and cooldown standing without limitations. It is too soon to note any discernable progressions. Will continue to monitor and progress as able. Laurianne has completed 9 exercise sessions. She exercises on the recumbent bike and Nustep for 15 min. She averages 3.8 METs at level 1 on the recumbent bike and 2.2 METs at level 4 on the Nustep. She performs the warmup and cooldown standing without limitations. Laloni has increased her workload for exercise modes. She has tolerated progressions well as METs have increased. Trinnie seems motivated to exercise and improve her functional capacity. Will continue to monitor and progress as able. Aidana has completed 15 exercise sessions. She exercises on the recumbent bike and Nustep for 15 min. She averages 4.6 METs at level 4 on the recumbent bike and 3.2 METs at level 4 on the Nustep. She performs the warmup and cooldown standing without limitations. [place] has increased her workload for exercise modes multiple time. She still tolerates progressions well as METs have increased. Nicia does exercise outside of  rehab 2-3 days/wk. We have recently looked at fitness centers that accept [place]. Will continue to monitor and progress as able. Vida has completed 20  exercise sessions. She exercises on the recumbent bike and Nustep for 15 min. She averages 2.7 METs at level 5 on the recumbent bike and 2.9 METs at level 6 on the Nustep. She performs the warmup and cooldown standing without limitations. Donnah has increased her workloads for both exercise modes. Her METs have not improved despite this increase. Jimmi's METs decreased after she came back from vacation. She has not been feeling well enough to keep her METs up even though she is not ill. Will continue to monitor and progress as able. Berdene has completed 27 exercise sessions. She exercises on the recumbent bike and Nustep for 15 min. She averages 4.4 METs at level 5.5 on the recumbent bike and 2.5 METs at level 5 on the Nustep. She performs the warmup and cooldown standing without limitations. Remonia increased her workloads for both exercise modes. She was successful in increasing her METs on the recumbent bike but not the Nustep. Her workload on the Nustep was decreased because it was too hard for her. Kristalynn has hit a plateau as I do not see any significant progressions. She has met program goals as she is nearing graduation. I believe Allexa will be successful in her home exercise. Will continue to monitor and progress as able.   Expected Outcomes Through exercise at rehab and home, the patient will decrease shortness of breath with daily activities and feel confident in carrying out an exercise regimen at home. Through exercise at rehab and home, the patient will decrease shortness of breath with daily activities  and feel confident in carrying out an exercise regimen at home. Through exercise at rehab and home, the patient will decrease shortness of breath with daily activities and feel confident in carrying out an exercise regimen at home. Through exercise at rehab and home, the patient will decrease shortness of breath with daily activities and feel confident in carrying out an exercise regimen at home. Through  exercise at rehab and home, the patient will decrease shortness of breath with daily activities and feel confident in carrying out an exercise regimen at home.            Nutrition & Weight - Outcomes:  Pre Biometrics - 07/27/22 0910       Pre Biometrics   Grip Strength 19 kg              Nutrition:  Nutrition Therapy & Goals - 11/20/22 1058       Nutrition Therapy   Diet Heart Healthy diet      Personal Nutrition Goals   Nutrition Goal Patient to identify strategies for weight maintanence/weight gain of 0.5-2.0# per week.    Personal Goal #2 Patient to reduce sodium intake to '2000mg'$  per day    Personal Goal #3 Patient to identify food sources and limit daily intake of saturated fat, trans fat, sodium, and refined carbohydrates    Comments Goals in progress. Most recent labs show improved cholesterol with total cholesterol down to 127 and LDL down to 69 since starting fenobibrate and zetia in October 2023. She continues follow-up with GI regarding ongoing GI symptoms. She is up 4.4# since starting with our program. Taiyah will continue to benefit from participation in pulmonary rehab for nutrition, exercise, and lifestyle modification.      Intervention Plan   Intervention Prescribe, educate and counsel regarding individualized specific dietary modifications aiming towards targeted core components such as weight, hypertension, lipid management, diabetes, heart failure and other comorbidities.;Nutrition handout(s) given to patient.    Expected Outcomes Short Term Goal: Understand basic principles of dietary content, such as calories, fat, sodium, cholesterol and nutrients.;Short Term Goal: A plan has been developed with personal nutrition goals set during dietitian appointment.;Long Term Goal: Adherence to prescribed nutrition plan.             Nutrition Discharge:  Nutrition Assessments - 11/08/22 1446       Rate Your Plate Scores   Post Score 59              Education Questionnaire Score:  Knowledge Questionnaire Score - 11/07/22 0643       Knowledge Questionnaire Score   Post Score 17/18             Goals reviewed with patient; copy given to patient.

## 2022-12-20 ENCOUNTER — Encounter (HOSPITAL_COMMUNITY): Payer: PPO

## 2022-12-25 ENCOUNTER — Encounter: Payer: Self-pay | Admitting: Cardiology

## 2023-01-02 ENCOUNTER — Telehealth: Payer: Self-pay | Admitting: Cardiology

## 2023-01-02 NOTE — Telephone Encounter (Signed)
Patient states she sent a message through [facility] and hasn't gotten an answer yet.  She states she sent it last week.

## 2023-01-02 NOTE — Telephone Encounter (Signed)
Patient returned call since she had not hear if she should continue her Eliquis (see orior message).  She does add that she had stopped the medication for 2-3 weeks at [facility] when she had a flare up of Sjogren syndrome??.  She then restarted in January.  The low BP was just recent and she states it is normal today.  I noted that it may not be related to Eliquis but continue to take medication until otherwise directed.  She ask to again should she stop or reduce medication as she has no "palpitations". I advised this is used for reduce risk of blood clotting and stroke and should not be changed or stopped without physicians approval.  If she is to continue she will need a new refill by Friday.

## 2023-01-03 MED ORDER — APIXABAN 5 MG PO TABS: 5.0000 mg | ORAL_TABLET | Freq: Two times a day (BID) | ORAL | 3 refills | Status: DC

## 2023-01-03 NOTE — Telephone Encounter (Signed)
Minus Breeding, MD  Cv Div Nl Triage4 minutes ago (9:38 AM)    She will need a refill because she is on the anticoagulation for her paroxysmal A-fib.  She should remain on the dose as prescribed to prevent thromboembolism and stroke.   Spoke with pt regarding sending in refills of eliquis. Prescription sent to pt's pharmacy of choice. Return office visit also scheduled with pt. Pt verbalizes understanding.

## 2023-01-10 DIAGNOSIS — Z79899 Other long term (current) drug therapy: Secondary | ICD-10-CM | POA: Diagnosis not present

## 2023-01-15 ENCOUNTER — Emergency Department (HOSPITAL_COMMUNITY): Payer: PPO

## 2023-01-15 ENCOUNTER — Other Ambulatory Visit: Payer: Self-pay

## 2023-01-15 ENCOUNTER — Encounter (HOSPITAL_COMMUNITY): Payer: Self-pay

## 2023-01-15 ENCOUNTER — Emergency Department (HOSPITAL_COMMUNITY)
Admission: EM | Admit: 2023-01-15 | Discharge: 2023-01-15 | Disposition: A | Discharge status: Home / Self Care | Payer: PPO | Attending: Emergency Medicine | Admitting: Emergency Medicine

## 2023-01-15 DIAGNOSIS — I2510 Atherosclerotic heart disease of native coronary artery without angina pectoris: Secondary | ICD-10-CM | POA: Insufficient documentation

## 2023-01-15 DIAGNOSIS — R0602 Shortness of breath: Secondary | ICD-10-CM | POA: Diagnosis not present

## 2023-01-15 DIAGNOSIS — Z79899 Other long term (current) drug therapy: Secondary | ICD-10-CM | POA: Insufficient documentation

## 2023-01-15 DIAGNOSIS — U071 COVID-19: Secondary | ICD-10-CM | POA: Insufficient documentation

## 2023-01-15 DIAGNOSIS — G459 Transient cerebral ischemic attack, unspecified: Secondary | ICD-10-CM | POA: Diagnosis not present

## 2023-01-15 DIAGNOSIS — D649 Anemia, unspecified: Secondary | ICD-10-CM | POA: Insufficient documentation

## 2023-01-15 DIAGNOSIS — Z7901 Long term (current) use of anticoagulants: Secondary | ICD-10-CM | POA: Diagnosis not present

## 2023-01-15 DIAGNOSIS — R059 Cough, unspecified: Secondary | ICD-10-CM | POA: Diagnosis not present

## 2023-01-15 DIAGNOSIS — R42 Dizziness and giddiness: Secondary | ICD-10-CM | POA: Diagnosis not present

## 2023-01-15 LAB — CBC WITH DIFFERENTIAL/PLATELET
Abs Immature Granulocytes: 0.05 10*3/uL (ref 0.00–0.07)
Basophils Absolute: 0 10*3/uL (ref 0.0–0.1)
Basophils Relative: 0 %
Eosinophils Absolute: 0 10*3/uL (ref 0.0–0.5)
Eosinophils Relative: 0 %
HCT: 35.7 % — ABNORMAL LOW (ref 36.0–46.0)
Hemoglobin: 11.4 g/dL — ABNORMAL LOW (ref 12.0–15.0)
Immature Granulocytes: 1 %
Lymphocytes Relative: 6 %
Lymphs Abs: 0.4 10*3/uL — ABNORMAL LOW (ref 0.7–4.0)
MCH: 32.1 pg (ref 26.0–34.0)
MCHC: 31.9 g/dL (ref 30.0–36.0)
MCV: 100.6 fL — ABNORMAL HIGH (ref 80.0–100.0)
Monocytes Absolute: 0.3 10*3/uL (ref 0.1–1.0)
Monocytes Relative: 4 %
Neutro Abs: 6.9 10*3/uL (ref 1.7–7.7)
Neutrophils Relative %: 89 %
Platelets: 411 10*3/uL — ABNORMAL HIGH (ref 150–400)
RBC: 3.55 MIL/uL — ABNORMAL LOW (ref 3.87–5.11)
RDW: 14.9 % (ref 11.5–15.5)
WBC: 7.8 10*3/uL (ref 4.0–10.5)
nRBC: 0 % (ref 0.0–0.2)

## 2023-01-15 LAB — CBG MONITORING, ED: Glucose-Capillary: 122 mg/dL — ABNORMAL HIGH (ref 70–99)

## 2023-01-15 LAB — COMPREHENSIVE METABOLIC PANEL
ALT: 20 U/L (ref 0–44)
AST: 22 U/L (ref 15–41)
Albumin: 2.7 g/dL — ABNORMAL LOW (ref 3.5–5.0)
Alkaline Phosphatase: 34 U/L — ABNORMAL LOW (ref 38–126)
Anion gap: 10 (ref 5–15)
BUN: 15 mg/dL (ref 8–23)
CO2: 25 mmol/L (ref 22–32)
Calcium: 9.1 mg/dL (ref 8.9–10.3)
Chloride: 103 mmol/L (ref 98–111)
Creatinine, Ser: 1.02 mg/dL — ABNORMAL HIGH (ref 0.44–1.00)
GFR, Estimated: 57 mL/min — ABNORMAL LOW (ref >60)
Glucose, Bld: 128 mg/dL — ABNORMAL HIGH (ref 70–99)
Potassium: 3.7 mmol/L (ref 3.5–5.1)
Sodium: 138 mmol/L (ref 135–145)
Total Bilirubin: 0.7 mg/dL (ref 0.3–1.2)
Total Protein: 6.4 g/dL — ABNORMAL LOW (ref 6.5–8.1)

## 2023-01-15 LAB — SARS CORONAVIRUS 2 BY RT PCR: SARS Coronavirus 2 by RT PCR: POSITIVE — AB

## 2023-01-15 LAB — TROPONIN I (HIGH SENSITIVITY): Troponin I (High Sensitivity): 6 ng/L (ref <18)

## 2023-01-15 MED ORDER — MOLNUPIRAVIR EUA 200MG CAPSULE: 4.0000 | ORAL_CAPSULE | Freq: Two times a day (BID) | ORAL | 0 refills | Status: AC

## 2023-01-15 MED ORDER — GADOBUTROL 1 MMOL/ML IV SOLN
5.5000 mL | Freq: Once | INTRAVENOUS | Status: AC | PRN
  Administered 2023-01-15: 5.5 mL via INTRAVENOUS

## 2023-01-15 NOTE — ED Triage Notes (Signed)
Pt reports going to bed last night at 2200 and woke up today with some light headedness and has continued to feel 'off' all day. Symptoms have progressed to SOB with exertion/dry cough/generalized weakness/hot sweats

## 2023-01-15 NOTE — ED Provider Notes (Signed)
[place] [facility] Provider Note   CSN: [id number] Arrival date & time: 01/15/23  1447     History  Chief Complaint  Patient presents with   Shortness of Breath   Dizziness    Susan Bray is a 77 y.o. female.   Shortness of Breath Dizziness Associated symptoms: shortness of breath      Patient with medical history of ILD, CAD, A-fib on Eliquis, hyperlipidemia presents to the emergency department due to dizziness/lightheadedness.  5 days ago patient was having sensation of the room spinning like vertigo, this resolved.  She is still been having constant lightheadedness.  She feels weak, also having a gait abnormality where she feels like she is unable to walk in a straight line due to balance issues.  There is no lateralized weakness or numbness, no vision changes, no headache.  He does endorse feeling more short of breath than baseline but denies any change in cough, also no chest pain though she is having palpitations which she states she has had previously and is followed by cardiology.  Home Medications Prior to Admission medications   Medication Sig Start Date End Date Taking? Authorizing Provider  molnupiravir EUA (LAGEVRIO) 200 mg CAPS capsule Take 4 capsules (800 mg total) by mouth 2 (two) times daily for 5 days. 01/15/23 01/20/23 Yes Sherrill Raring, PA-C  apixaban (ELIQUIS) 5 MG TABS tablet Take 1 tablet (5 mg total) by mouth 2 (two) times daily. 01/03/23   Minus Breeding, MD  azaTHIOprine (IMURAN) 50 MG tablet Take 3 tablets (150 mg total) by mouth daily. 08/13/22   [name], Hart Robinsons, MD  clobetasol ointment (TEMOVATE) 0.05 % Apply topically 2 (two) times daily. 08/15/22   [provider]  dicyclomine (BENTYL) 20 MG tablet Take 1 tablet (20 mg total) by mouth every 6 (six) hours as needed for spasms. 10/25/22   Daryel November, MD  diltiazem (CARDIZEM CD) 120 MG 24 hr capsule Take 1 capsule (120 mg total) by mouth daily. Patient  not taking: Reported on 10/25/2022 08/23/22 08/18/23  Minus Breeding, MD  ezetimibe (ZETIA) 10 MG tablet Take 10 mg by mouth daily.    [provider]  fenofibrate micronized (LOFIBRA) 134 MG capsule Take 134 mg by mouth daily before breakfast.    [provider]  predniSONE (DELTASONE) 10 MG tablet Take 5 mg by mouth daily. 02/12/22   [provider]  SYNTHROID 50 MCG tablet Take by mouth. 06/04/21   [provider]      Allergies    Colesevelam and Statins    Review of Systems   Review of Systems  Respiratory:  Positive for shortness of breath.   Neurological:  Positive for dizziness.    Physical Exam Updated Vital Signs BP 106/67   Pulse 80   Temp 97.7 F (36.5 C) (Oral)   Resp 18   Ht 5\' 3"  (1.6 m)   Wt 55.8 kg   SpO2 100%   BMI 21.79 kg/m  Physical Exam Vitals and nursing note reviewed. Exam conducted with a chaperone present.  Constitutional:      Appearance: Normal appearance.  HENT:     Head: Normocephalic and atraumatic.  Eyes:     General: No scleral icterus.       Right eye: No discharge.        Left eye: No discharge.     Extraocular Movements: Extraocular movements intact.     Pupils: Pupils are equal, round, and reactive  to light.  Cardiovascular:     Rate and Rhythm: Normal rate and regular rhythm.     Pulses: Normal pulses.     Heart sounds: Normal heart sounds. No murmur heard.    No friction rub. No gallop.  Pulmonary:     Effort: Pulmonary effort is normal. No respiratory distress.     Breath sounds: Normal breath sounds.  Abdominal:     General: Abdomen is flat. Bowel sounds are normal. There is no distension.     Palpations: Abdomen is soft.     Tenderness: There is no abdominal tenderness.  Skin:    General: Skin is warm and dry.     Coloration: Skin is not jaundiced.  Neurological:     Mental Status: She is alert. Mental status is at baseline.     Coordination: Coordination normal.     ED Results /  Procedures / Treatments   Labs (all labs ordered are listed, but only abnormal results are displayed) Labs Reviewed  SARS CORONAVIRUS 2 BY RT PCR - Abnormal; Notable for the following components:      Result Value   SARS Coronavirus 2 by RT PCR POSITIVE (*)    All other components within normal limits  COMPREHENSIVE METABOLIC PANEL - Abnormal; Notable for the following components:   Glucose, Bld 128 (*)    Creatinine, Ser 1.02 (*)    Total Protein 6.4 (*)    Albumin 2.7 (*)    Alkaline Phosphatase 34 (*)    GFR, Estimated 57 (*)    All other components within normal limits  CBC WITH DIFFERENTIAL/PLATELET - Abnormal; Notable for the following components:   RBC 3.55 (*)    Hemoglobin 11.4 (*)    HCT 35.7 (*)    MCV 100.6 (*)    Platelets 411 (*)    Lymphs Abs 0.4 (*)    All other components within normal limits  CBG MONITORING, ED - Abnormal; Notable for the following components:   Glucose-Capillary 122 (*)    All other components within normal limits  TROPONIN I (HIGH SENSITIVITY)  TROPONIN I (HIGH SENSITIVITY)    EKG EKG Interpretation  Date/Time:  Tuesday January 15 2023 15:26:40 EDT Ventricular Rate:  75 PR Interval:  124 QRS Duration: 76 QT Interval:  372 QTC Calculation: 415 R Axis:   9 Text Interpretation: Normal sinus rhythm Normal ECG When compared with ECG of 23-Aug-2022 11:16, PREVIOUS ECG IS PRESENT Confirmed by Tretha Sciara [phone]) on 01/15/2023 3:39:30 PM  Radiology MR Angiogram Neck W or Wo Contrast  Result Date: 01/15/2023 CLINICAL DATA:  Transient ischemic attack (TIA); Stroke/TIA, determine embolic source EXAM: MRI HEAD WITHOUT AND WITH CONTRAST MRA HEAD WITHOUT CONTRAST MRA NECK WITHOUT AND WITH CONTRAST TECHNIQUE: Multiplanar, multi-echo pulse sequences of the brain and surrounding structures were acquired without and with intravenous contrast. Angiographic images of the Circle of Willis were acquired using MRA technique without intravenous contrast.  Angiographic images of the neck were acquired using MRA technique without and with intravenous contrast. Carotid stenosis measurements (when applicable) are obtained utilizing NASCET criteria, using the distal internal carotid diameter as the denominator. CONTRAST:  5.73mL GADAVIST GADOBUTROL 1 MMOL/ML IV SOLN COMPARISON:  MRI head September 20, 2015. FINDINGS: MRI HEAD FINDINGS Brain: No acute infarction, hemorrhage, hydrocephalus, extra-axial collection or mass lesion. No pathologic intracranial enhancement. No pathologic enhancement. Vascular: See below. Skull and upper cervical spine: Normal marrow signal. Sinuses/Orbits: No acute or significant finding. Other: No mastoid effusions. MRA HEAD FINDINGS Anterior  circulation: Bilateral intracranial ICAs, MCAs, and ACAs are patent without proximal hemodynamically significant stenosis. Posterior circulation: Bilateral intradural vertebral arteries, basilar artery and bilateral posterior cerebral arteries are patent without proximal hemodynamically significant stenosis. Prominent right posterior communicating artery with small right P1 PCA, anatomic variant. MRA NECK FINDINGS Aortic arch: Great vessel origins are patent without significant stenosis. Right carotid system: Patent without hemodynamically significant stenosis. Left carotid system: Patent without hemodynamically significant stenosis. Vertebral arteries: Left dominant. Both vertebral arteries are patent without hemodynamically significant stenosis. IMPRESSION: 1. No evidence of acute intracranial abnormality. 2. No large vessel occlusion or proximal hemodynamically significant stenosis. Electronically Signed   By: Margaretha Sheffield M.D.   On: 01/15/2023 17:22   MR Brain W and Wo Contrast  Result Date: 01/15/2023 CLINICAL DATA:  Transient ischemic attack (TIA); Stroke/TIA, determine embolic source EXAM: MRI HEAD WITHOUT AND WITH CONTRAST MRA HEAD WITHOUT CONTRAST MRA NECK WITHOUT AND WITH CONTRAST  TECHNIQUE: Multiplanar, multi-echo pulse sequences of the brain and surrounding structures were acquired without and with intravenous contrast. Angiographic images of the Circle of Willis were acquired using MRA technique without intravenous contrast. Angiographic images of the neck were acquired using MRA technique without and with intravenous contrast. Carotid stenosis measurements (when applicable) are obtained utilizing NASCET criteria, using the distal internal carotid diameter as the denominator. CONTRAST:  5.30mL GADAVIST GADOBUTROL 1 MMOL/ML IV SOLN COMPARISON:  MRI head September 20, 2015. FINDINGS: MRI HEAD FINDINGS Brain: No acute infarction, hemorrhage, hydrocephalus, extra-axial collection or mass lesion. No pathologic intracranial enhancement. No pathologic enhancement. Vascular: See below. Skull and upper cervical spine: Normal marrow signal. Sinuses/Orbits: No acute or significant finding. Other: No mastoid effusions. MRA HEAD FINDINGS Anterior circulation: Bilateral intracranial ICAs, MCAs, and ACAs are patent without proximal hemodynamically significant stenosis. Posterior circulation: Bilateral intradural vertebral arteries, basilar artery and bilateral posterior cerebral arteries are patent without proximal hemodynamically significant stenosis. Prominent right posterior communicating artery with small right P1 PCA, anatomic variant. MRA NECK FINDINGS Aortic arch: Great vessel origins are patent without significant stenosis. Right carotid system: Patent without hemodynamically significant stenosis. Left carotid system: Patent without hemodynamically significant stenosis. Vertebral arteries: Left dominant. Both vertebral arteries are patent without hemodynamically significant stenosis. IMPRESSION: 1. No evidence of acute intracranial abnormality. 2. No large vessel occlusion or proximal hemodynamically significant stenosis. Electronically Signed   By: Margaretha Sheffield M.D.   On: 01/15/2023 17:22    MR ANGIO HEAD WO CONTRAST  Result Date: 01/15/2023 CLINICAL DATA:  Transient ischemic attack (TIA); Stroke/TIA, determine embolic source EXAM: MRI HEAD WITHOUT AND WITH CONTRAST MRA HEAD WITHOUT CONTRAST MRA NECK WITHOUT AND WITH CONTRAST TECHNIQUE: Multiplanar, multi-echo pulse sequences of the brain and surrounding structures were acquired without and with intravenous contrast. Angiographic images of the Circle of Willis were acquired using MRA technique without intravenous contrast. Angiographic images of the neck were acquired using MRA technique without and with intravenous contrast. Carotid stenosis measurements (when applicable) are obtained utilizing NASCET criteria, using the distal internal carotid diameter as the denominator. CONTRAST:  5.32mL GADAVIST GADOBUTROL 1 MMOL/ML IV SOLN COMPARISON:  MRI head September 20, 2015. FINDINGS: MRI HEAD FINDINGS Brain: No acute infarction, hemorrhage, hydrocephalus, extra-axial collection or mass lesion. No pathologic intracranial enhancement. No pathologic enhancement. Vascular: See below. Skull and upper cervical spine: Normal marrow signal. Sinuses/Orbits: No acute or significant finding. Other: No mastoid effusions. MRA HEAD FINDINGS Anterior circulation: Bilateral intracranial ICAs, MCAs, and ACAs are patent without proximal hemodynamically significant stenosis. Posterior circulation: Bilateral intradural vertebral  arteries, basilar artery and bilateral posterior cerebral arteries are patent without proximal hemodynamically significant stenosis. Prominent right posterior communicating artery with small right P1 PCA, anatomic variant. MRA NECK FINDINGS Aortic arch: Great vessel origins are patent without significant stenosis. Right carotid system: Patent without hemodynamically significant stenosis. Left carotid system: Patent without hemodynamically significant stenosis. Vertebral arteries: Left dominant. Both vertebral arteries are patent without  hemodynamically significant stenosis. IMPRESSION: 1. No evidence of acute intracranial abnormality. 2. No large vessel occlusion or proximal hemodynamically significant stenosis. Electronically Signed   By: Margaretha Sheffield M.D.   On: 01/15/2023 17:22   DG Chest Portable 1 View  Result Date: 01/15/2023 CLINICAL DATA:  Shortness of breath, cough. EXAM: PORTABLE CHEST 1 VIEW COMPARISON:  August 29, 2017. FINDINGS: The heart size and mediastinal contours are within normal limits. Stable bibasilar interstitial densities are noted most consistent with scarring. No acute pulmonary disease is noted. The visualized skeletal structures are unremarkable. IMPRESSION: No acute cardiopulmonary disease. Electronically Signed   By: Marijo Conception M.D.   On: 01/15/2023 15:51    Procedures Procedures    Medications Ordered in ED Medications  gadobutrol (GADAVIST) 1 MMOL/ML injection 5.5 mL (5.5 mLs Intravenous Contrast Given 01/15/23 1647)    ED Course/ Medical Decision Making/ A&P Clinical Course as of 01/15/23 1742  Tue Jan 15, 2023  1517 Stable light headed and dizziness.  Viral workup/Screening labs/CTH Day 4 of symptoms: MRI head for eval for cryptogenic CVA [CC]  1634 POC CBG, ED(!) wnl [HS]  [id number] CBC with Differential(!) Mildly anemic, no leukocytosis. [HS]  [id number] DG Chest Portable 1 View No consolidation or pneumonia. [HS]  1656 Troponin I (High Sensitivity) Low  [HS]  1656 SARS Coronavirus 2 by RT PCR (hospital order, performed in [facility] hospital lab) *cepheid single result test* Anterior Nasal Swab(!) Positive for covid  [HS]    Clinical Course User Index [CC] Tretha Sciara, MD [HS] Sherrill Raring, [place]                             Medical Decision Making Amount and/or Complexity of Data Reviewed Labs: ordered. Decision-making details documented in ED Course. Radiology: ordered. Decision-making details documented in ED Course.  Risk Prescription drug  management.   Patient presents to the emergency department due to lightheadedness.  Differential is cardiac versus neurologic, TIA/CVA, ACS, arrhythmia, electrolyte derangement, viral URI, pneumonia, AKI, dehydration all other considerations.  Clinically, patient meets no SIRS criteria and is not septic I think that is less likely.  Orthostatic hypotension is also consideration, patient send report decreased oral intake.  Will start with labs, MRI brain, chest x-ray and viral panel.  On cardiac monitoring patient is in regular rate and rhythm, no ischemic changes on EKG.  Laboratory workup ordered and reviewed in the ED course.  No underlying new arrhythmias detected, do not think this is ACS given negative delta troponin and absence of pain.  Chest x-ray is clear without any signs of pneumonia, patient is notably COVID-positive which I think is the source of her symptoms.  The MRI brain number brain and neck are negative for any acute process, I agree with the neuroradiologist.  I discussed the workup with the patient, will discharge home with antivirals and have her follow-up closely with her PCP.  Considered admission but given stable vitals, reassuring laboratory workup and imaging I do not think indicated.        Final Clinical  Impression(s) / ED Diagnoses Final diagnoses:  COVID-19    Rx / DC Orders ED Discharge Orders          Ordered    molnupiravir EUA (LAGEVRIO) 200 mg CAPS capsule  2 times daily        01/15/23 1731              Sherrill Raring, PA-C 01/15/23 1742    Tretha Sciara, MD 01/17/23 [phone]

## 2023-01-15 NOTE — Discharge Instructions (Signed)
Drink plenty of fluids, take the antiviral twice daily for 5 days.  Suspect your symptoms are due to decreased oral intake as well as the COVID-19.  This should pass on its own, follow-up with your primary if no improvement in the next week.

## 2023-01-21 ENCOUNTER — Ambulatory Visit (HOSPITAL_BASED_OUTPATIENT_CLINIC_OR_DEPARTMENT_OTHER)
Admission: RE | Admit: 2023-01-21 | Discharge: 2023-01-21 | Disposition: A | Discharge status: Home / Self Care | Payer: PPO | Source: Ambulatory Visit | Attending: Pulmonary Disease | Admitting: Pulmonary Disease

## 2023-01-21 DIAGNOSIS — J849 Interstitial pulmonary disease, unspecified: Secondary | ICD-10-CM

## 2023-01-21 DIAGNOSIS — J8410 Pulmonary fibrosis, unspecified: Secondary | ICD-10-CM | POA: Diagnosis not present

## 2023-01-22 DIAGNOSIS — I4891 Unspecified atrial fibrillation: Secondary | ICD-10-CM | POA: Insufficient documentation

## 2023-01-22 NOTE — Progress Notes (Unsigned)
  Cardiology Office Note:   Date:  01/24/2023  ID:  Susan Bray, DOB 10-06-1946, MRN [id number]  History of Present Illness:   Susan Bray is a 77 y.o. female who presents for evaluation of dizziness, lightheadedness, arm discomfort.   I saw her in 2017.    She was found to have non obstructive and branch vessel disease and was managed medically.   I saw her in June and she had some increased shortness of breath.  She has not had cardiac evaluation since that time.  Perfusion imaging demonstrated no evidence of ischemia and was thought to be low risk.  I saw her recently at pulmonary rehab.  She was noted to have atrial fib.    In November she had an echo with no significant abnormalities.  In July she had a perfusion study that was low risk.  She wore a monitor in November that demonstrated no atrial fibrillation.  She is coming in now because she is getting dizzy spells.  She feels her heart rate go down and I see the 60s in the 50s.  She is concerned because her diastolic goes in the 123456 and 30s.  Systolics often in the 123456 range.  She has not had any frank syncope.  She has not had any new palpitations.  She denies any chest pressure, neck or arm discomfort.  She has had no weight gain or edema.   ROS: As stated in the HPI and negative for all other systems.  Studies Reviewed:    EKG:  NA   Risk Assessment/Calculations:    CHA2DS2-VASc Score =   3  Physical Exam:   VS:  BP 110/66   Pulse 67   Ht 5\' 3"  (1.6 m)   Wt 124 lb 12.8 oz (56.6 kg)   SpO2 97%   BMI 22.11 kg/m    Wt Readings from Last 3 Encounters:  01/24/23 124 lb 12.8 oz (56.6 kg)  01/15/23 123 lb (55.8 kg)  12/04/22 128 lb 15.5 oz (58.5 kg)     GEN: Well nourished, well developed in no acute distress NECK: No JVD; No carotid bruits CARDIAC: RRR, no murmurs, rubs, gallops RESPIRATORY:  Clear to auscultation without rales, wheezing or rhonchi  ABDOMEN: Soft, non-tender, non-distended EXTREMITIES:  No edema; No  deformity   ASSESSMENT AND PLAN:    PAF: The patient is having paroxysmal atrial fibrillation.   CHA2DS2-VASc score is 3.  Continue anticoagulation.  I had started Cardizem at the last visit because she has been in rapid atrial fibrillation pulmonary rehab.  I am going to have to stop this as her blood pressure not tolerating it.  She has not had any further tachypalpitations and nothing on the monitor.  If she gets recurrent atrial fibrillation and we might need to use a lower dose beta-blocker or immediate release calcium channel blocker.  CAD:   The patient has no new sypmtoms.  No further cardiovascular testing is indicated.  We will continue with aggressive risk reduction and meds as listed.   DYSLIPIDEMIA:   LDL was 69.  No change in therapy.    HTN: Her blood pressure low and I will make the change above.        Signed, Minus Breeding, MD

## 2023-01-24 ENCOUNTER — Ambulatory Visit: Payer: PPO | Attending: Cardiology | Admitting: Cardiology

## 2023-01-24 ENCOUNTER — Encounter: Payer: Self-pay | Admitting: Cardiology

## 2023-01-24 VITALS — BP 110/66 | HR 67 | Ht 63.0 in | Wt 124.8 lb

## 2023-01-24 DIAGNOSIS — I10 Essential (primary) hypertension: Secondary | ICD-10-CM

## 2023-01-24 DIAGNOSIS — I2510 Atherosclerotic heart disease of native coronary artery without angina pectoris: Secondary | ICD-10-CM

## 2023-01-24 DIAGNOSIS — E785 Hyperlipidemia, unspecified: Secondary | ICD-10-CM | POA: Diagnosis not present

## 2023-01-24 DIAGNOSIS — I4891 Unspecified atrial fibrillation: Secondary | ICD-10-CM

## 2023-01-24 NOTE — Patient Instructions (Signed)
Medication Instructions:  Your physician has recommended you make the following change in your medication:   -Stop taking diltizem (cardizem).   *If you need a refill on your cardiac medications before your next appointment, please call your pharmacy*   Follow-Up: At [facility], you and your health needs are our priority.  As part of our continuing mission to provide you with exceptional heart care, we have created designated Provider Care Teams.  These Care Teams include your primary Cardiologist (physician) and Advanced Practice Providers (APPs -  Physician Assistants and Nurse Practitioners) who all work together to provide you with the care you need, when you need it.  We recommend signing up for the patient portal called "MyChart".  Sign up information is provided on this After Visit Summary.  MyChart is used to connect with patients for Virtual Visits (Telemedicine).  Patients are able to view lab/test results, encounter notes, upcoming appointments, etc.  Non-urgent messages can be sent to your provider as well.   To learn more about what you can do with MyChart, go to [email or web address].    Your next appointment:   4 month(s)  Provider:   Fabian Sharp, PA-C, Sande Rives, PA-C, Caron Presume, PA-C, Jory Sims, DNP, ANP, Almyra Deforest, PA-C, Diona Browner, NP, or Mayra Reel, NP

## 2023-01-28 ENCOUNTER — Ambulatory Visit (INDEPENDENT_AMBULATORY_CARE_PROVIDER_SITE_OTHER): Payer: PPO | Admitting: Pulmonary Disease

## 2023-01-28 DIAGNOSIS — J849 Interstitial pulmonary disease, unspecified: Secondary | ICD-10-CM | POA: Diagnosis not present

## 2023-01-28 LAB — PULMONARY FUNCTION TEST
DL/VA % pred: 103 %
DL/VA: 4.3 ml/min/mmHg/L
DLCO cor % pred: 87 %
DLCO cor: 16.21 ml/min/mmHg
DLCO unc % pred: 81 %
DLCO unc: 15.12 ml/min/mmHg
FEF 25-75 Post: 2.03 L/sec
FEF 25-75 Pre: 2.11 L/sec
FEF2575-%Change-Post: -3 %
FEF2575-%Pred-Post: 126 %
FEF2575-%Pred-Pre: 131 %
FEV1-%Change-Post: -5 %
FEV1-%Pred-Post: 91 %
FEV1-%Pred-Pre: 96 %
FEV1-Post: 1.85 L
FEV1-Pre: 1.95 L
FEV1FVC-%Change-Post: 0 %
FEV1FVC-%Pred-Pre: 110 %
FEV6-%Change-Post: -5 %
FEV6-%Pred-Post: 86 %
FEV6-%Pred-Pre: 91 %
FEV6-Post: 2.22 L
FEV6-Pre: 2.34 L
FEV6FVC-%Change-Post: 0 %
FEV6FVC-%Pred-Post: 105 %
FEV6FVC-%Pred-Pre: 105 %
FVC-%Change-Post: -5 %
FVC-%Pred-Post: 82 %
FVC-%Pred-Pre: 87 %
FVC-Post: 2.22 L
FVC-Pre: 2.35 L
Post FEV1/FVC ratio: 83 %
Post FEV6/FVC ratio: 100 %
Pre FEV1/FVC ratio: 83 %
Pre FEV6/FVC Ratio: 100 %
RV % pred: 55 %
RV: 1.24 L
TLC % pred: 75 %
TLC: 3.71 L

## 2023-01-28 NOTE — Progress Notes (Signed)
Full PFT performed today. °

## 2023-01-28 NOTE — Patient Instructions (Signed)
Full PFT performed today. °

## 2023-02-01 ENCOUNTER — Ambulatory Visit (INDEPENDENT_AMBULATORY_CARE_PROVIDER_SITE_OTHER): Payer: PPO | Admitting: Pulmonary Disease

## 2023-02-01 ENCOUNTER — Encounter: Payer: Self-pay | Admitting: Pulmonary Disease

## 2023-02-01 VITALS — BP 112/60 | HR 68 | Ht 63.0 in | Wt 122.8 lb

## 2023-02-01 DIAGNOSIS — Z5181 Encounter for therapeutic drug level monitoring: Secondary | ICD-10-CM

## 2023-02-01 DIAGNOSIS — J849 Interstitial pulmonary disease, unspecified: Secondary | ICD-10-CM

## 2023-02-01 NOTE — Progress Notes (Signed)
Susan Bray    [id number]    1945-12-07  Primary Care Physician:Hammer, Theone Murdoch, MD  Referring Physician: Irven Coe, MD [address] [place],  [place] [address]  Chief complaint: Follow-up for interstitial lung disease  HPI: 77 y.o. who  has a past medical history of Atrial fibrillation, CAD (coronary artery disease), Cataract, Diverticulitis, Hyperlipidemia, IBS (irritable bowel syndrome), Thyroid disease, and VIN III (vulvar intraepithelial neoplasia III).   She has Sjogren's syndrome and dermatomyositis with initial complaints of skin thickening, joint stiffness, dyspnea and cough.  Diagnosis made in June 2023 by Dr. Deanne Coffer.  Initially tried on prednisone and methotrexate.  Methotrexate was stopped for unclear reason.    Imaging shows interstitial lung disease and she has been referred here for further evaluation She was started on azathioprine 50 mg/day in July 2023 and this was increased to 100 mg/day in August 2023 and to 150 mg/day on October 2023. Azathioprine has been denied by insurance and spite of multiple appeals.  She is paying out-of-pocket with good Rx card, costs $30 a month.  Her symptoms have improved with the treatment Continues on prednisone per Dr. Leone Brand pulmonary rehab She had an episode of paroxysmal atrial fibrillation in November 2023 while in pulmonary rehab.  Has followed up with Dr. Antoine Poche from cardiology.  Started on Eliquis and Cardizem.  Pets: No pets Occupation: Retired Airline pilot Exposures: No mold, hot tub, Jacuzzi.  No feather pillows or comforters Smoking history: Never smoker Travel history: Significant travel history Relevant family history: No family history of lung disease  Interim history: Currently on azathioprine 150 mg a day.   Prednisone has been reduced to  by Dr. Deanne Coffer.  He attempted to get her off prednisone but had to restart due to arthritis symptoms.  Evaluated in the ED on 01/15/2023 for  lightheadedness with clear chest x-ray.  Noted to be COVID-positive and was treated with molnupiravir.  She also saw Dr. Antoine Poche for dizziness, episodes of hypotension and was taken off Cardizem.  Here for review of CT and PFTs.  Outpatient Encounter Medications as of 02/01/2023  Medication Sig   apixaban (ELIQUIS) 5 MG TABS tablet Take 1 tablet (5 mg total) by mouth 2 (two) times daily.   azaTHIOprine (IMURAN) 50 MG tablet Take 3 tablets (150 mg total) by mouth daily.   clobetasol ointment (TEMOVATE) 0.05 % Apply topically 2 (two) times daily.   dicyclomine (BENTYL) 20 MG tablet Take 1 tablet (20 mg total) by mouth every 6 (six) hours as needed for spasms.   ezetimibe (ZETIA) 10 MG tablet Take 10 mg by mouth daily.   fenofibrate micronized (LOFIBRA) 134 MG capsule Take 134 mg by mouth daily before breakfast.   predniSONE (DELTASONE) 10 MG tablet Take 5 mg by mouth daily.   SYNTHROID 50 MCG tablet Take by mouth.   No facility-administered encounter medications on file as of 02/01/2023.    Physical Exam: Blood pressure 112/60, pulse 68, height  (1.6 m), weight 122 lb 12.8 oz (55.7 kg), SpO2 97 %. Gen:      No acute distress HEENT:  EOMI, sclera anicteric Neck:     No masses; no thyromegaly Lungs:    Clear to auscultation bilaterally; normal respiratory effort CV:         Regular rate and rhythm; no murmurs Abd:      + bowel sounds; soft, non-tender; no palpable masses, no distension Ext:  No edema; adequate peripheral perfusion Skin:      Warm and dry; no rash Neuro: alert and oriented x 3 Psych: normal mood and affect   Data Reviewed: Imaging: Cardiac CT 10/19/2015-visualized lung shows fibrotic changes with groundglass and traction bronchiectasis.  High-resolution CT 05/31/2022-basilar predominant fibrotic disease with groundglass opacities, traction bronchiectasis.  Alternate diagnosis suggestive of fibrotic NSIP  High resolution CT 01/21/2023-organized pattern of  interstitial lung disease, improvement in inflammatory changes. I have reviewed the images personally  PFTs: 06/10/2022 FVC 2.17 [79%], FEV1 1.94 [94%], F/F 89, TLC 3.04 [61%], DLCO 16.88 [91%] Moderate restriction  01/28/2023 FVC 2.22 [82%], FEV1 1.85 [91%], F/F83, TLC 3.71 [95%], DLCO 15.12 [81%] Mild restriction  Labs: ILD panel 05/22/2022- ANA 1: 320, positive SSA, NX P2 antibody C-ANCA 05/22/2022- positive  CBC 01/15/2023- WBC 7.8, hemoglobin 11.4, platelets 411 CMP 01/15/2023-alk phos 34, AST 22, ALT 20  Assessment:  Interstitial lung disease Likely related to her diagnosis of Sjogren's and dermatomyositis.  The pattern appears to be alternate suggestive of fibrotic NSIP.  This was noted as early as 2016 on a cardiac CT and appears largely unchanged.  PFTs show moderate restriction  Currently on azathioprine at 150 a day and prednisone 5 mg a day. Prednisone is being managed by Dr. Deanne Coffer, rheumatology  CT this month by my review shows improvement in groundglass changes with some consolidation of scarring.  Her lung volumes are better on PFTs.  Overall she is improving.  Continue current therapy. Labs last month were within normal limits  Health maintenance I have advised her to get flu, COVID and RSV vaccination but she has declined these vaccinations.  Plan/Recommendations: Continue azathioprine Monitor labs  Chilton Greathouse MD [facility] 02/01/2023, 10:01 AM  CC: Irven Coe, MD

## 2023-02-01 NOTE — Patient Instructions (Signed)
I am glad you are doing well with your breathing Your CT and PFTs show some improvement which is good Continue the azathioprine as prescribed Continue the prednisone as per Dr. Deanne Coffer Return to clinic in 6 months

## 2023-02-04 ENCOUNTER — Telehealth: Payer: Self-pay | Admitting: Gastroenterology

## 2023-02-04 DIAGNOSIS — R61 Generalized hyperhidrosis: Secondary | ICD-10-CM | POA: Diagnosis not present

## 2023-02-04 DIAGNOSIS — I959 Hypotension, unspecified: Secondary | ICD-10-CM | POA: Diagnosis not present

## 2023-02-04 DIAGNOSIS — R109 Unspecified abdominal pain: Secondary | ICD-10-CM | POA: Diagnosis not present

## 2023-02-04 LAB — LAB REPORT - SCANNED: EGFR: 55

## 2023-02-04 NOTE — Telephone Encounter (Signed)
Pt states she ate a salad about 3 weeks ago and her stomach has not been right since. States she is having lots of gas and bloating with pressure. Reports it wakes her up at night. She is taking metamucil, gas-x, and bentyl but reports none of these seem to be helping. Please advise.

## 2023-02-04 NOTE — Telephone Encounter (Signed)
Patient called abdominal pain has IBS and a lot of pressure for about two weeks now. Seeking advise.

## 2023-02-04 NOTE — Telephone Encounter (Signed)
Inbound call from Dr. Bufford Lope office, stating they are getting the patient scheduled for a CT abdomen and pelvis, went ahead and scheduled OV with Gunnar Fusi for 6/5 at 3:00 PM. States patient would still like sooner appt and further advise on note below.

## 2023-02-05 ENCOUNTER — Other Ambulatory Visit (HOSPITAL_COMMUNITY): Payer: Self-pay | Admitting: Family Medicine

## 2023-02-05 DIAGNOSIS — R109 Unspecified abdominal pain: Secondary | ICD-10-CM

## 2023-02-05 NOTE — Telephone Encounter (Signed)
Spoke with pt and she is aware that we will wait to see what the CT scan shows.

## 2023-02-06 NOTE — Telephone Encounter (Signed)
CT is scheduled for 02/07/23

## 2023-02-07 ENCOUNTER — Ambulatory Visit (HOSPITAL_BASED_OUTPATIENT_CLINIC_OR_DEPARTMENT_OTHER)
Admission: RE | Admit: 2023-02-07 | Discharge: 2023-02-07 | Disposition: A | Discharge status: Home / Self Care | Payer: PPO | Source: Ambulatory Visit | Attending: Family Medicine | Admitting: Family Medicine

## 2023-02-07 ENCOUNTER — Encounter (HOSPITAL_BASED_OUTPATIENT_CLINIC_OR_DEPARTMENT_OTHER): Payer: Self-pay

## 2023-02-07 DIAGNOSIS — I700 Atherosclerosis of aorta: Secondary | ICD-10-CM | POA: Diagnosis not present

## 2023-02-07 DIAGNOSIS — R109 Unspecified abdominal pain: Secondary | ICD-10-CM | POA: Insufficient documentation

## 2023-02-07 DIAGNOSIS — N134 Hydroureter: Secondary | ICD-10-CM | POA: Diagnosis not present

## 2023-02-07 MED ORDER — IOHEXOL 300 MG/ML  SOLN: 100.0000 mL | Freq: Once | INTRAMUSCULAR | Status: DC | PRN

## 2023-02-07 MED ORDER — IOHEXOL 300 MG/ML  SOLN
100.0000 mL | Freq: Once | INTRAMUSCULAR | Status: AC | PRN
  Administered 2023-02-07: 100 mL via INTRAVENOUS

## 2023-02-08 ENCOUNTER — Telehealth: Payer: Self-pay | Admitting: Pulmonary Disease

## 2023-02-08 NOTE — Telephone Encounter (Signed)
Pt calling bc she is experiencing a lot of symptoms related to ILD

## 2023-02-08 NOTE — Telephone Encounter (Signed)
Spoke with patient. She is wanting someone to discuss CT of abdomen with her. I advised she would need to call pcp office back for those results since they ordered it. She verbalized understanding. nfn

## 2023-02-11 ENCOUNTER — Telehealth: Payer: Self-pay | Admitting: Gastroenterology

## 2023-02-11 ENCOUNTER — Encounter (HOSPITAL_COMMUNITY): Payer: Self-pay

## 2023-02-11 ENCOUNTER — Emergency Department (HOSPITAL_COMMUNITY): Payer: PPO

## 2023-02-11 ENCOUNTER — Emergency Department (HOSPITAL_COMMUNITY)
Admission: EM | Admit: 2023-02-11 | Discharge: 2023-02-11 | Disposition: A | Discharge status: Home / Self Care | Payer: PPO | Attending: Emergency Medicine | Admitting: Emergency Medicine

## 2023-02-11 DIAGNOSIS — K5720 Diverticulitis of large intestine with perforation and abscess without bleeding: Secondary | ICD-10-CM | POA: Diagnosis not present

## 2023-02-11 DIAGNOSIS — Z7901 Long term (current) use of anticoagulants: Secondary | ICD-10-CM | POA: Diagnosis not present

## 2023-02-11 DIAGNOSIS — K5730 Diverticulosis of large intestine without perforation or abscess without bleeding: Secondary | ICD-10-CM | POA: Diagnosis not present

## 2023-02-11 DIAGNOSIS — K630 Abscess of intestine: Secondary | ICD-10-CM | POA: Diagnosis not present

## 2023-02-11 DIAGNOSIS — R1030 Lower abdominal pain, unspecified: Secondary | ICD-10-CM | POA: Diagnosis present

## 2023-02-11 LAB — BASIC METABOLIC PANEL
Anion gap: 8 (ref 5–15)
BUN: 13 mg/dL (ref 8–23)
CO2: 28 mmol/L (ref 22–32)
Calcium: 9.3 mg/dL (ref 8.9–10.3)
Chloride: 103 mmol/L (ref 98–111)
Creatinine, Ser: 1.11 mg/dL — ABNORMAL HIGH (ref 0.44–1.00)
GFR, Estimated: 52 mL/min — ABNORMAL LOW (ref >60)
Glucose, Bld: 128 mg/dL — ABNORMAL HIGH (ref 70–99)
Potassium: 4.2 mmol/L (ref 3.5–5.1)
Sodium: 139 mmol/L (ref 135–145)

## 2023-02-11 LAB — LACTIC ACID, PLASMA: Lactic Acid, Venous: 1.5 mmol/L (ref 0.5–1.9)

## 2023-02-11 LAB — URINALYSIS, ROUTINE W REFLEX MICROSCOPIC
Bilirubin Urine: NEGATIVE
Glucose, UA: NEGATIVE mg/dL
Hgb urine dipstick: NEGATIVE
Ketones, ur: NEGATIVE mg/dL
Leukocytes,Ua: NEGATIVE
Nitrite: NEGATIVE
Protein, ur: NEGATIVE mg/dL
Specific Gravity, Urine: 1.018 (ref 1.005–1.030)
pH: 5 (ref 5.0–8.0)

## 2023-02-11 LAB — CBC
HCT: 33.8 % — ABNORMAL LOW (ref 36.0–46.0)
Hemoglobin: 10.7 g/dL — ABNORMAL LOW (ref 12.0–15.0)
MCH: 32.7 pg (ref 26.0–34.0)
MCHC: 31.7 g/dL (ref 30.0–36.0)
MCV: 103.4 fL — ABNORMAL HIGH (ref 80.0–100.0)
Platelets: 624 10*3/uL — ABNORMAL HIGH (ref 150–400)
RBC: 3.27 MIL/uL — ABNORMAL LOW (ref 3.87–5.11)
RDW: 15.9 % — ABNORMAL HIGH (ref 11.5–15.5)
WBC: 7.8 10*3/uL (ref 4.0–10.5)
nRBC: 0 % (ref 0.0–0.2)

## 2023-02-11 MED ORDER — CIPROFLOXACIN HCL 500 MG PO TABS: 500.0000 mg | ORAL_TABLET | Freq: Two times a day (BID) | ORAL | 0 refills | Status: DC

## 2023-02-11 MED ORDER — SODIUM CHLORIDE 0.9 % IV SOLN
2.0000 g | Freq: Once | INTRAVENOUS | Status: AC
  Administered 2023-02-11: 2 g via INTRAVENOUS
  Filled 2023-02-11: qty 20

## 2023-02-11 MED ORDER — IOHEXOL 350 MG/ML SOLN
75.0000 mL | Freq: Once | INTRAVENOUS | Status: AC | PRN
  Administered 2023-02-11: 75 mL via INTRAVENOUS

## 2023-02-11 MED ORDER — METRONIDAZOLE 500 MG PO TABS: 500.0000 mg | ORAL_TABLET | Freq: Three times a day (TID) | ORAL | 0 refills | Status: DC

## 2023-02-11 MED ORDER — METRONIDAZOLE 500 MG/100ML IV SOLN
500.0000 mg | Freq: Once | INTRAVENOUS | Status: AC
  Administered 2023-02-11: 500 mg via INTRAVENOUS
  Filled 2023-02-11: qty 100

## 2023-02-11 NOTE — ED Triage Notes (Signed)
Pt here for eval of abscess/ diverticulitis, states it needs to be drained. First noticed symptoms approx 1.5wks ago. Denies fevers, NVD, only endorses pain, "pressure" in pelvic/ rectal area. Last meal approx 1hr ago

## 2023-02-11 NOTE — ED Provider Notes (Signed)
[place] EMERGENCY DEPARTMENT AT [facility] Provider Note   CSN: [id number] Arrival date & time: 02/11/23  1109     History  Chief Complaint  Patient presents with   Abdominal Pain   Abscess    Susan Bray is a 77 y.o. female with history of atrial fibrillation, diverticulitis, IBS on immune modulating medication.  Patient presents to ED for evaluation of lower abdominal pain.  The patient states that she was seen by her PCP on Thursday of last week due to lower abdominal pain.  Patient had CT scan performed which did show diverticular abscess.  Patient was called by her PCP this morning and advised to come to the ED. Patient complains of right-sided abdominal pain for the last 1-1/2 weeks.  Patient seen by GI doctor on Thursday, had CT scan done which showed diverticular abscess.  They are recommending percutaneous drain conducted by IR.  Patient denies fevers, nausea, vomiting.  Patient reports last bowel movement yesterday.  Reports she urinated this morning without issue. Patient reports that her abdominal pain has subsided however now she has pain in her rectum and pelvic area.  She states that this morning she had 1 episode of urgency however denies any overt dysuria.  Reports last meal was 1 hour ago, eggs and sausage.  Denies fevers.   Abdominal Pain Abscess      Home Medications Prior to Admission medications   Medication Sig Start Date End Date Taking? Authorizing Provider  ciprofloxacin (CIPRO) 500 MG tablet Take 1 tablet (500 mg total) by mouth 2 (two) times daily. 02/11/23  Yes Al Decant, PA-C  metroNIDAZOLE (FLAGYL) 500 MG tablet Take 1 tablet (500 mg total) by mouth 3 (three) times daily. 02/11/23  Yes Al Decant, PA-C  apixaban (ELIQUIS) 5 MG TABS tablet Take 1 tablet (5 mg total) by mouth 2 (two) times daily. 01/03/23   Rollene Rotunda, MD  azaTHIOprine (IMURAN) 50 MG tablet Take 3 tablets (150 mg total) by mouth daily. 08/13/22    [name], Colbert Coyer, MD  clobetasol ointment (TEMOVATE) 0.05 % Apply topically 2 (two) times daily. 08/15/22   [provider]  dicyclomine (BENTYL) 20 MG tablet Take 1 tablet (20 mg total) by mouth every 6 (six) hours as needed for spasms. 10/25/22   Jenel Lucks, MD  ezetimibe (ZETIA) 10 MG tablet Take 10 mg by mouth daily.    [provider]  fenofibrate micronized (LOFIBRA) 134 MG capsule Take 134 mg by mouth daily before breakfast.    [provider]  predniSONE (DELTASONE) 10 MG tablet Take 5 mg by mouth daily. 02/12/22   [provider]  SYNTHROID 50 MCG tablet Take by mouth. 06/04/21   [provider]      Allergies    Colesevelam and Statins    Review of Systems   Review of Systems  Gastrointestinal:  Positive for abdominal pain.  All other systems reviewed and are negative.   Physical Exam Updated Vital Signs BP (!) 103/59   Pulse (!) 59   Temp 98.4 F (36.9 C) (Oral)   Resp 17   SpO2 99%  Physical Exam Vitals and nursing note reviewed.  Constitutional:      General: She is not in acute distress.    Appearance: She is well-developed.  HENT:     Head: Normocephalic and atraumatic.     Mouth/Throat:     Mouth: Mucous membranes are moist.     Pharynx: Oropharynx is clear.  Eyes:     Conjunctiva/sclera: Conjunctivae normal.  Cardiovascular:     Rate and Rhythm: Normal rate and regular rhythm.     Heart sounds: No murmur heard. Pulmonary:     Effort: Pulmonary effort is normal. No respiratory distress.     Breath sounds: Normal breath sounds.  Abdominal:     Palpations: Abdomen is soft.     Tenderness: There is abdominal tenderness in the right lower quadrant.  Musculoskeletal:        General: No swelling.     Cervical back: Neck supple.  Skin:    General: Skin is warm and dry.     Capillary Refill: Capillary refill takes less than 2 seconds.  Neurological:     Mental Status: She is alert and oriented to person,  place, and time.  Psychiatric:        Mood and Affect: Mood normal.     ED Results / Procedures / Treatments   Labs (all labs ordered are listed, but only abnormal results are displayed) Labs Reviewed  CBC - Abnormal; Notable for the following components:      Result Value   RBC 3.27 (*)    Hemoglobin 10.7 (*)    HCT 33.8 (*)    MCV 103.4 (*)    RDW 15.9 (*)    Platelets 624 (*)    All other components within normal limits  BASIC METABOLIC PANEL - Abnormal; Notable for the following components:   Glucose, Bld 128 (*)    Creatinine, Ser 1.11 (*)    GFR, Estimated 52 (*)    All other components within normal limits  URINALYSIS, ROUTINE W REFLEX MICROSCOPIC - Abnormal; Notable for the following components:   Color, Urine AMBER (*)    All other components within normal limits  CULTURE, BLOOD (ROUTINE X 2)  CULTURE, BLOOD (ROUTINE X 2)  LACTIC ACID, PLASMA  LACTIC ACID, PLASMA    EKG None  Radiology CT ABDOMEN PELVIS W CONTRAST  Result Date: 02/11/2023 CLINICAL DATA:  Diverticular abscess. EXAM: CT ABDOMEN AND PELVIS WITH CONTRAST TECHNIQUE: Multidetector CT imaging of the abdomen and pelvis was performed using the standard protocol following bolus administration of intravenous contrast. RADIATION DOSE REDUCTION: This exam was performed according to the departmental dose-optimization program which includes automated exposure control, adjustment of the mA and/or kV according to patient size and/or use of iterative reconstruction technique. CONTRAST:  75mL OMNIPAQUE IOHEXOL 350 MG/ML SOLN COMPARISON:  February 07, 2023. FINDINGS: Lower chest: No acute abnormality. Hepatobiliary: No focal liver abnormality is seen. No gallstones, gallbladder wall thickening, or biliary dilatation. Pancreas: Unremarkable. No pancreatic ductal dilatation or surrounding inflammatory changes. Spleen: Normal in size without focal abnormality. Adrenals/Urinary Tract: Adrenal glands appear normal. No renal or  ureteral calculi are noted. Mild right hydroureter is noted which is decreased compared to prior exam, and most likely due to compression of distal ureter due to pelvic abscess. Stomach/Bowel: Stomach is unremarkable. The appendix appears normal. There is no evidence of bowel obstruction. Sigmoid diverticulosis is noted. There is again noted distal sigmoid diverticulitis with associated abscess which now measures 4.9 x 2.9 cm and is significantly smaller compared to prior exam. Vascular/Lymphatic: Aortic atherosclerosis. No enlarged abdominal or pelvic lymph nodes. Reproductive: Status post hysterectomy. No adnexal masses. Other: No abdominal wall hernia or abnormality. No abdominopelvic ascites. Musculoskeletal: No acute or significant osseous findings. IMPRESSION: Continued presence of mild distal sigmoid diverticulitis. There is associated diverticular abscess present which now measures 4.9 x 2.9 cm  and is significantly smaller compared to prior exam. Mild right hydroureter is noted which is decreased compared to prior exam, and most likely due to compression of distal right ureter due to pelvic abscess. Aortic Atherosclerosis (ICD10-I70.0). Electronically Signed   By: Lupita Raider M.D.   On: 02/11/2023 14:25    Procedures Procedures   Medications Ordered in ED Medications  cefTRIAXone (ROCEPHIN) 2 g in sodium chloride 0.9 % 100 mL IVPB (0 g Intravenous Stopped 02/11/23 1422)    And  metroNIDAZOLE (FLAGYL) IVPB 500 mg (0 mg Intravenous Stopped 02/11/23 1525)  iohexol (OMNIPAQUE) 350 MG/ML injection 75 mL (75 mLs Intravenous Contrast Given 02/11/23 1407)    ED Course/ Medical Decision Making/ A&P  Medical Decision Making Amount and/or Complexity of Data Reviewed Labs: ordered. Radiology: ordered.  Risk Prescription drug management.   77 year old female presents to the ED for evaluation.  Please see HPI for further details.  On examination patient is afebrile and nontachycardic.  Lung  sounds clear bilaterally, not hypoxic.  Abdomen has tenderness in the right lower quadrant however soft and compressible.  Patient alert and oriented x 3.  CBC shows no leukocytosis, hemoglobin 10.7 which is baseline.  BMP with no electrolyte derangement, creatinine 1.1 which is slightly elevated from past creatinine however does not indicate AKI.  Urinalysis unremarkable.  Lactic acid 1.5 not elevated.  CT scan of abdomen pelvis shows decreasing size of diverticular abscess.  Discussed patient case with Dr. Deanne Coffer, interventional radiology who advises that the patient does not need to come in for percutaneous drain placement.  Patient to be discharged with outpatient antibiotics and follow-up with gastroenterology.  Patient is amenable to this plan.  Patient received 500 mg Flagyl here in the department, 2 g Rocephin.  Patient will be sent home with 7 days of Flagyl 500 mg which she will take 3 times daily plus ciprofloxacin 500 mg 2 times daily for 7 days.  Patient will be advised to follow-up with her gastroenterologist for further management.  All questions answered to patient satisfaction.  Patient case discussed with attending Dr. Doran Durand who voices agreement with plan of management.  Patient stable for discharge.   Final Clinical Impression(s) / ED Diagnoses Final diagnoses:  Colonic diverticular abscess    Rx / DC Orders ED Discharge Orders          Ordered    metroNIDAZOLE (FLAGYL) 500 MG tablet  3 times daily        02/11/23 1646    ciprofloxacin (CIPRO) 500 MG tablet  2 times daily        02/11/23 1646              Al Decant, PA-C 02/11/23 1647    Glyn Ade, MD 02/11/23 1751

## 2023-02-11 NOTE — Telephone Encounter (Signed)
Called Dr. Bufford Lope office and let them know Dr. Tomasa Rand has spoken with the pt and she was instructed to proceed to [facility] ER. Interventional radiology will most likely be involved to drain the abscess.

## 2023-02-11 NOTE — Telephone Encounter (Signed)
Dr. Tomasa Rand please see CT results below:   IMPRESSION: Mild distal sigmoid diverticulitis, with adjacent 6.4 cm diverticular abscess in right pelvis. Percutaneous catheter drainage should be considered.   New mild-to-moderate right hydroureteronephrosis due to involvement of the distal ureter by the abscess.

## 2023-02-11 NOTE — Discharge Instructions (Addendum)
Return to the ED with any new or worsening signs or symptoms such as increased abdominal pain, nausea, vomiting, inability to urinate Please follow-up with your GI doctor, Dr. Tomasa Rand for further management Please read attached guide concerning diverticulitis Please begin taking antibiotics I placed you on.  Please begin taking metronidazole 500 mg by mouth 3 times daily plus ciprofloxacin 500 mg 2 times daily by mouth.  You will take these for the next 7 days.

## 2023-02-11 NOTE — ED Provider Triage Note (Addendum)
Emergency Medicine Provider Triage Evaluation Note  Susan Bray , a 77 y.o. female  was evaluated in triage.  Pt complains of right-sided abdominal pain for the last 1-1/2 weeks.  Patient seen by GI doctor on Thursday, had CT scan done which showed diverticular abscess.  They are recommending percutaneous drain conducted by IR.  Patient denies fevers, nausea, vomiting.  Patient reports last bowel movement yesterday.  Patient reports that her abdominal pain has subsided however now she has pain in her rectum and pelvic area.  She states that this morning she had 1 episode of urgency however denies any overt dysuria.  Reports last meal was 1 hour ago, eggs and sausage.  Review of Systems  Positive:  Negative:   Physical Exam  There were no vitals taken for this visit. Gen:   Awake, no distress   Resp:  Normal effort  MSK:   Moves extremities without difficulty  Other:    Medical Decision Making  Medically screening exam initiated at 11:20 AM.  Appropriate orders placed.  Susan Bray was informed that the remainder of the evaluation will be completed by another provider, this initial triage assessment does not replace that evaluation, and the importance of remaining in the ED until their evaluation is complete.         Al Decant, PA-C 02/11/23 1122

## 2023-02-11 NOTE — Telephone Encounter (Signed)
Spoke with Triage Nurse at [facility] and let them know pt is on her way there and results of CT scan/abscess.

## 2023-02-11 NOTE — Telephone Encounter (Signed)
Inbound call from [facility] from Dr. Bufford Lope, would like to know if the patient's abscess can be drained in office, or if they should send patient to the ER. They would also like Dr. Tomasa Rand to view CT results done on 4/18.  Please advise [phone].

## 2023-02-13 ENCOUNTER — Other Ambulatory Visit: Payer: Self-pay | Admitting: Pulmonary Disease

## 2023-02-16 LAB — CULTURE, BLOOD (ROUTINE X 2)
Culture: NO GROWTH
Culture: NO GROWTH
Special Requests: ADEQUATE

## 2023-02-19 ENCOUNTER — Encounter: Payer: Self-pay | Admitting: *Deleted

## 2023-02-26 ENCOUNTER — Other Ambulatory Visit (HOSPITAL_COMMUNITY): Payer: Self-pay | Admitting: Family Medicine

## 2023-02-26 DIAGNOSIS — M810 Age-related osteoporosis without current pathological fracture: Secondary | ICD-10-CM | POA: Diagnosis not present

## 2023-02-26 DIAGNOSIS — M3500 Sicca syndrome, unspecified: Secondary | ICD-10-CM | POA: Diagnosis not present

## 2023-02-26 DIAGNOSIS — K5792 Diverticulitis of intestine, part unspecified, without perforation or abscess without bleeding: Secondary | ICD-10-CM | POA: Diagnosis not present

## 2023-02-26 DIAGNOSIS — I959 Hypotension, unspecified: Secondary | ICD-10-CM | POA: Diagnosis not present

## 2023-02-26 DIAGNOSIS — D84821 Immunodeficiency due to drugs: Secondary | ICD-10-CM | POA: Diagnosis not present

## 2023-02-26 DIAGNOSIS — R61 Generalized hyperhidrosis: Secondary | ICD-10-CM | POA: Diagnosis not present

## 2023-02-26 DIAGNOSIS — E063 Autoimmune thyroiditis: Secondary | ICD-10-CM | POA: Diagnosis not present

## 2023-02-26 DIAGNOSIS — I700 Atherosclerosis of aorta: Secondary | ICD-10-CM | POA: Diagnosis not present

## 2023-02-26 DIAGNOSIS — J849 Interstitial pulmonary disease, unspecified: Secondary | ICD-10-CM | POA: Diagnosis not present

## 2023-02-26 DIAGNOSIS — M3390 Dermatopolymyositis, unspecified, organ involvement unspecified: Secondary | ICD-10-CM | POA: Diagnosis not present

## 2023-02-26 DIAGNOSIS — E039 Hypothyroidism, unspecified: Secondary | ICD-10-CM | POA: Diagnosis not present

## 2023-02-26 DIAGNOSIS — I480 Paroxysmal atrial fibrillation: Secondary | ICD-10-CM | POA: Diagnosis not present

## 2023-03-12 DIAGNOSIS — J849 Interstitial pulmonary disease, unspecified: Secondary | ICD-10-CM | POA: Diagnosis not present

## 2023-03-12 DIAGNOSIS — M3390 Dermatopolymyositis, unspecified, organ involvement unspecified: Secondary | ICD-10-CM | POA: Diagnosis not present

## 2023-03-12 DIAGNOSIS — R5381 Other malaise: Secondary | ICD-10-CM | POA: Diagnosis not present

## 2023-03-12 DIAGNOSIS — M810 Age-related osteoporosis without current pathological fracture: Secondary | ICD-10-CM | POA: Diagnosis not present

## 2023-03-14 ENCOUNTER — Ambulatory Visit (HOSPITAL_COMMUNITY)
Admission: RE | Admit: 2023-03-14 | Discharge: 2023-03-14 | Disposition: A | Discharge status: Home / Self Care | Payer: PPO | Source: Ambulatory Visit | Attending: Family Medicine | Admitting: Family Medicine

## 2023-03-14 DIAGNOSIS — K5730 Diverticulosis of large intestine without perforation or abscess without bleeding: Secondary | ICD-10-CM | POA: Diagnosis not present

## 2023-03-14 DIAGNOSIS — R634 Abnormal weight loss: Secondary | ICD-10-CM | POA: Diagnosis not present

## 2023-03-14 DIAGNOSIS — Z79899 Other long term (current) drug therapy: Secondary | ICD-10-CM | POA: Diagnosis not present

## 2023-03-14 DIAGNOSIS — K5792 Diverticulitis of intestine, part unspecified, without perforation or abscess without bleeding: Secondary | ICD-10-CM | POA: Insufficient documentation

## 2023-03-14 DIAGNOSIS — J849 Interstitial pulmonary disease, unspecified: Secondary | ICD-10-CM | POA: Diagnosis not present

## 2023-03-14 DIAGNOSIS — M3390 Dermatopolymyositis, unspecified, organ involvement unspecified: Secondary | ICD-10-CM | POA: Diagnosis not present

## 2023-03-14 DIAGNOSIS — M3500 Sicca syndrome, unspecified: Secondary | ICD-10-CM | POA: Diagnosis not present

## 2023-03-14 DIAGNOSIS — R21 Rash and other nonspecific skin eruption: Secondary | ICD-10-CM | POA: Diagnosis not present

## 2023-03-14 DIAGNOSIS — R682 Dry mouth, unspecified: Secondary | ICD-10-CM | POA: Diagnosis not present

## 2023-03-14 DIAGNOSIS — R5383 Other fatigue: Secondary | ICD-10-CM | POA: Diagnosis not present

## 2023-03-14 DIAGNOSIS — H04129 Dry eye syndrome of unspecified lacrimal gland: Secondary | ICD-10-CM | POA: Diagnosis not present

## 2023-03-14 DIAGNOSIS — M810 Age-related osteoporosis without current pathological fracture: Secondary | ICD-10-CM | POA: Diagnosis not present

## 2023-03-14 MED ORDER — IOHEXOL 9 MG/ML PO SOLN
ORAL | Status: AC
  Filled 2023-03-14: qty 1000

## 2023-03-14 MED ORDER — IOHEXOL 300 MG/ML  SOLN
100.0000 mL | Freq: Once | INTRAMUSCULAR | Status: AC | PRN
  Administered 2023-03-14: 100 mL via INTRAVENOUS

## 2023-03-14 MED ORDER — IOHEXOL 9 MG/ML PO SOLN
500.0000 mL | ORAL | Status: AC
  Administered 2023-03-14 (×2): 500 mL via ORAL

## 2023-03-27 ENCOUNTER — Ambulatory Visit: Payer: PPO | Admitting: Nurse Practitioner

## 2023-03-27 ENCOUNTER — Encounter: Payer: Self-pay | Admitting: Nurse Practitioner

## 2023-03-27 VITALS — BP 110/64 | HR 76 | Ht 63.0 in | Wt 122.4 lb

## 2023-03-27 DIAGNOSIS — K5792 Diverticulitis of intestine, part unspecified, without perforation or abscess without bleeding: Secondary | ICD-10-CM | POA: Diagnosis not present

## 2023-03-27 DIAGNOSIS — D539 Nutritional anemia, unspecified: Secondary | ICD-10-CM | POA: Diagnosis not present

## 2023-03-27 MED ORDER — CIPROFLOXACIN HCL 500 MG PO TABS: 500.0000 mg | ORAL_TABLET | Freq: Two times a day (BID) | ORAL | 0 refills | Status: AC

## 2023-03-27 MED ORDER — METRONIDAZOLE 500 MG PO TABS: 500.0000 mg | ORAL_TABLET | Freq: Four times a day (QID) | ORAL | 0 refills | Status: DC

## 2023-03-27 NOTE — Progress Notes (Signed)
Assessment and Plan   Primary GI: Susan Amass, MD  Brief Narrative:  77 y.o. yo female whose past medical history includes but is not necessarily limited to Sjogren syndrome/dermatomyositis with interstitial lung disease, coronary artery disease,  atrial fibrillation on Eliquis, diverticulitis complicated by abscess.   History of recurrent sigmoid diverticulitis, most recently complicated by RLQ abscess. Abscess resolving, IR didn't recommend percutaneous drainage.  Except for some mild residual nagging RLQ discomfort  she is overall doing much better after course of Cipro / Flagyl.   -Negative Cologuard February 2023.  Patient declines colonoscopy to rule out underlying neoplasm.  -Will send in a prescription for a week's worth of Cipro and Flagyl to keep on hand.  If she has increasing pain in the RLQ and/or fevers then she will start herself on clear liquids, start the antibiotics and contact our office for further instructions  Macrocytic anemia, new. Possibly reactive( especially with high platelet count). Macrocytosis 2/2 to imuran?  Baseline hgb 13.6, down to 11.4 in March and 10.7 in April 2024. -Further evaluation of anemia per PCP. She isn't open to endoscopic evaluation at this point.    History of Present Illness   Chief complaint: Follow-up on diverticulitis  Patient established care here 11/14/22 for evaluation of diverticulitis found on CT scan in June 2023. She gives a long history of intermittent RIGHT lower quadrant abdominal pain. Episodes are often transient with spontaneous resolution. She has generally thought the pain was gas related.  Though repeatedly documented as being LLQ she tells me the pain has always been in the RLQ. When we saw here in January she declined colonoscopy due to fear of procedure related complications. She had a negative Cologuard in Feb 2023. We recommended Metamucil and Bentyl. She is taking the Metamucil daily and has a daily BM. Bowel  anti-spasmotics do not help and make her "loopy".   Interval History:  Patient developed recurrent RLQ pain in mid April 2024. PCP got a CT scan which apparently showed a diverticular abscess. She was advised to go to ED on 4/22.  Repeat CT scan in ED showed decreasing size of the abscess. She was not febrile, WBC was normal. EDP spoke with IR and there was no need for drainage.  ED discharged her on Cipro and Flagyl with she completed. She feels much better other than some residual mild nagging pain in RLQ. She has had a little trouble emptying her bladder over the last few months. UA in ED was unremarkable.    A follow up CT scan on 5/23 showed a significant decrease in the size of diverticular abscess.   She is still resistant to a colonoscopy at this time  Previous GI Endoscopies / Labs / Imaging   CTAP w contrast 02/10/23 IMPRESSION: Mild distal sigmoid diverticulitis, with adjacent 6.4 cm diverticular abscess in right pelvis. Percutaneous catheter drainage should be considered.   New mild-to-moderate right hydroureteronephrosis due to involvement of the distal ureter by the abscess.   Aortic Atherosclerosis (ICD10-I70.0). Electronically Signed   By: Danae Orleans M.D.   On: 02/10/2023 12:06  CTAP w contrast 02/11/23 IMPRESSION: Continued presence of mild distal sigmoid diverticulitis. There is associated diverticular abscess present which now measures 4.9 x 2.9 cm and is significantly smaller compared to prior exam.   Mild right hydroureter is noted which is decreased compared to prior exam, and most likely due to compression of distal right ureter due to pelvic abscess.   Aortic Atherosclerosis (ICD10-I70.0).  CTAP w contrast 03/14/23 EXAM: CT ABDOMEN AND PELVIS WITH CONTRAST   TECHNIQUE: Multidetector CT imaging of the abdomen and pelvis was performed using the standard protocol following bolus administration of intravenous contrast.   RADIATION DOSE REDUCTION: This  exam was performed according to the departmental dose-optimization program which includes automated exposure control, adjustment of the mA and/or kV according to patient size and/or use of iterative reconstruction technique.   CONTRAST:  OMNIPAQUE IOHEXOL 300 MG/ML  SOLN   COMPARISON:  CT abdomen pelvis dated 02/11/2023.   FINDINGS: Lower chest: Bibasilar linear atelectasis/scarring. There is coronary vascular calcification.   No intra-abdominal free air or free fluid.   Hepatobiliary: The liver is unremarkable. The gallbladder is unremarkable.   Pancreas: No acute findings. Similar appearance of a focal hypoenhancing area in the uncinate process of the pancreas, likely related to fatty infiltration. No dilatation of the main pancreatic duct or gland atrophy.   Spleen: Normal in size without focal abnormality.   Adrenals/Urinary Tract: The adrenal glands unremarkable. There is no hydronephrosis on either side. There is symmetric enhancement and excretion of contrast by both kidneys. The visualized ureters and urinary bladder appear unremarkable.   Stomach/Bowel: Severe sigmoid diverticulosis with muscular hypertrophy. Significant decrease in the size of diverticular abscess, predominantly containing air, compared to prior CT. No drainable fluid collection. There is no bowel obstruction. The appendix is normal.   Vascular/Lymphatic: Moderate aortoiliac atherosclerotic disease. The IVC is unremarkable. No portal venous gas. There is no adenopathy.   Reproductive: Hysterectomy.  No adnexal masses.   Other: None   Musculoskeletal: Osteopenia.  No acute osseous pathology.   IMPRESSION: 1. Severe sigmoid diverticulosis with muscular hypertrophy. Decrease in the size of diverticular abscess compared to prior CT. No drainable fluid collection. 2. No bowel obstruction. Normal appendix. 3.  Aortic Atherosclerosis (ICD10-I70.0).       Latest Ref Rng & Units 01/15/2023     3:27 PM 08/13/2022    9:49 AM 08/13/2017    4:23 PM  Hepatic Function  Total Protein 6.5 - 8.1 g/dL 6.4  7.2  7.2   Albumin 3.5 - 5.0 g/dL 2.7  4.0  4.3   AST 15 - 41 U/L 22  20  16    ALT 0 - 44 U/L 20  12  9    Alk Phosphatase 38 - 126 U/L 34  44  61   Total Bilirubin 0.3 - 1.2 mg/dL 0.7  0.6  0.3        Latest Ref Rng & Units 02/11/2023   11:22 AM 01/15/2023    3:27 PM 08/13/2022    9:49 AM  CBC  WBC 4.0 - 10.5 K/uL 7.8  7.8  5.2   Hemoglobin 12.0 - 15.0 g/dL 16.1  09.6  04.5   Hematocrit 36.0 - 46.0 % 33.8  35.7  41.9   Platelets 150 - 400 K/uL 624  411  283.0     Past Medical History:  Diagnosis Date   Atrial fibrillation (HCC)    CAD (coronary artery disease)    Cataract    Diverticulitis    Hyperlipidemia    IBS (irritable bowel syndrome)    Thyroid disease    VIN III (vulvar intraepithelial neoplasia III)     Past Surgical History:  Procedure Laterality Date   ABDOMINAL HYSTERECTOMY  1978   CARDIAC CATHETERIZATION N/A 12/06/2015   Procedure: Left Heart Cath and Coronary Angiography;  Surgeon: Lyn Records, MD;  Location: [facility] INVASIVE CV LAB;  Service: Cardiovascular;  Laterality: N/A;   CATARACT EXTRACTION  08/17/04 ; 09/12/2004   left eye and right eye   CATARACT EXTRACTION     laser vaporization of hg vaginal dysplasia  12/2006   p & e repairs with cardinal uterosatral coloposusension (pelvic area repair)  03/03/09   severe dysplasia near rectum  03/18/2013   tummy tuck  1982   upper colpectomy  09/1993    Current Medications, Allergies, Family History and Social History were reviewed in [employer] record.     Current Outpatient Medications  Medication Sig Dispense Refill   apixaban (ELIQUIS) 5 MG TABS tablet Take 1 tablet (5 mg total) by mouth 2 (two) times daily. 180 tablet 3   azaTHIOprine (IMURAN) 50 MG tablet Take 3 tablets by mouth once daily 90 tablet 1   clobetasol ointment (TEMOVATE) 0.05 % Apply topically 2 (two)  times daily.     dicyclomine (BENTYL) 20 MG tablet Take 1 tablet (20 mg total) by mouth every 6 (six) hours as needed for spasms. 30 tablet 3   ezetimibe (ZETIA) 10 MG tablet Take 10 mg by mouth daily.     SYNTHROID 50 MCG tablet Take by mouth.     No current facility-administered medications for this visit.    Review of Systems: No chest pain. No shortness of breath. No urinary complaints.    Physical Exam  Wt Readings from Last 3 Encounters:  03/27/23 122 lb 6 oz (55.5 kg)  02/01/23 122 lb 12.8 oz (55.7 kg)  01/24/23 124 lb 12.8 oz (56.6 kg)    BP 110/64   Pulse 76   Ht 5\' 3"  (1.6 m)   Wt 122 lb 6 oz (55.5 kg)   BMI 21.68 kg/m  Constitutional:  Pleasant, generally well appearing female in no acute distress. Psychiatric: Normal mood and affect. Behavior is normal. EENT: Pupils normal.  Conjunctivae are normal. No scleral icterus. Neck supple.  Cardiovascular: Normal rate, regular rhythm.  Pulmonary/chest: Effort normal and breath sounds normal. No wheezing, rales or rhonchi. Abdominal: Soft, nondistended, NONTENDER. Bowel sounds active throughout. There are no masses palpable. No hepatomegaly. Neurological: Alert and oriented to person place and time.  Skin: Skin is warm and dry. No rashes noted.  Willette Cluster, NP  03/27/2023, 3:29 PM  Cc:  Irven Coe, MD

## 2023-03-27 NOTE — Patient Instructions (Addendum)
______________________________________________________  If your blood pressure at your visit was 140/90 or greater, please contact your primary care physician to follow up on this. _______________________________________________________  If you are age 75 or older, your body mass index should be between 23-30. Your Body mass index is 21.68 kg/m. If this is out of the aforementioned range listed, please consider follow up with your Primary Care Provider. ________________________________________________________  Diverticulitis:   For recurrent right lower abdominal pain start a clear liquid diet, start the week's worth of Cipro and Flagyl (which will be called to your pharmacy today) and then contact our office for further instructions.   [place] [facility] providers would like to encourage you to use [facility] to communicate with providers for non-urgent requests or questions.  Due to long hold times on the telephone, sending your provider a message by [facility] may be a faster and more efficient way to get a response.  Please allow 48 business hours for a response.  Please remember that this is for non-urgent requests.  _______________________________________________________  Thank you for entrusting me with your care and choosing [facility].  Willette Cluster, NP

## 2023-03-28 DIAGNOSIS — Z0000 Encounter for general adult medical examination without abnormal findings: Secondary | ICD-10-CM | POA: Diagnosis not present

## 2023-03-28 DIAGNOSIS — D6869 Other thrombophilia: Secondary | ICD-10-CM | POA: Diagnosis not present

## 2023-03-28 DIAGNOSIS — M3500 Sicca syndrome, unspecified: Secondary | ICD-10-CM | POA: Diagnosis not present

## 2023-03-28 DIAGNOSIS — E7800 Pure hypercholesterolemia, unspecified: Secondary | ICD-10-CM | POA: Diagnosis not present

## 2023-03-28 DIAGNOSIS — Z1159 Encounter for screening for other viral diseases: Secondary | ICD-10-CM | POA: Diagnosis not present

## 2023-03-28 DIAGNOSIS — I480 Paroxysmal atrial fibrillation: Secondary | ICD-10-CM | POA: Diagnosis not present

## 2023-03-28 DIAGNOSIS — E063 Autoimmune thyroiditis: Secondary | ICD-10-CM | POA: Diagnosis not present

## 2023-03-28 DIAGNOSIS — I700 Atherosclerosis of aorta: Secondary | ICD-10-CM | POA: Diagnosis not present

## 2023-03-28 DIAGNOSIS — R748 Abnormal levels of other serum enzymes: Secondary | ICD-10-CM | POA: Diagnosis not present

## 2023-03-28 DIAGNOSIS — J849 Interstitial pulmonary disease, unspecified: Secondary | ICD-10-CM | POA: Diagnosis not present

## 2023-03-28 DIAGNOSIS — E559 Vitamin D deficiency, unspecified: Secondary | ICD-10-CM | POA: Diagnosis not present

## 2023-03-28 DIAGNOSIS — M810 Age-related osteoporosis without current pathological fracture: Secondary | ICD-10-CM | POA: Diagnosis not present

## 2023-03-28 DIAGNOSIS — E039 Hypothyroidism, unspecified: Secondary | ICD-10-CM | POA: Diagnosis not present

## 2023-04-01 NOTE — Progress Notes (Signed)
Agree with the assessment and plan as outlined by Willette Cluster, NP.  Macrocytic anemia suspected to be related to Imuran.  Recommend close monitoring by prescribing provider.  Colonoscopy indicated but declined by patient.   E. Tomasa Rand, MD [facility]

## 2023-04-08 DIAGNOSIS — Z1211 Encounter for screening for malignant neoplasm of colon: Secondary | ICD-10-CM | POA: Diagnosis not present

## 2023-04-21 ENCOUNTER — Other Ambulatory Visit: Payer: Self-pay | Admitting: Pulmonary Disease

## 2023-04-24 ENCOUNTER — Other Ambulatory Visit: Payer: Self-pay

## 2023-04-24 ENCOUNTER — Encounter: Payer: Self-pay | Admitting: Pulmonary Disease

## 2023-04-24 MED ORDER — AZATHIOPRINE 50 MG PO TABS: 150.0000 mg | ORAL_TABLET | Freq: Every day | ORAL | 11 refills | Status: DC

## 2023-05-07 ENCOUNTER — Other Ambulatory Visit: Payer: Self-pay | Admitting: Family Medicine

## 2023-05-07 ENCOUNTER — Encounter: Payer: Self-pay | Admitting: Cardiology

## 2023-05-07 DIAGNOSIS — Z1231 Encounter for screening mammogram for malignant neoplasm of breast: Secondary | ICD-10-CM

## 2023-05-07 MED ORDER — APIXABAN 5 MG PO TABS: 5.0000 mg | ORAL_TABLET | Freq: Two times a day (BID) | ORAL | 2 refills | Status: DC

## 2023-05-08 ENCOUNTER — Ambulatory Visit
Admission: RE | Admit: 2023-05-08 | Discharge: 2023-05-08 | Disposition: A | Discharge status: Home / Self Care | Payer: PPO | Source: Ambulatory Visit | Attending: Family Medicine | Admitting: Family Medicine

## 2023-05-08 DIAGNOSIS — Z1231 Encounter for screening mammogram for malignant neoplasm of breast: Secondary | ICD-10-CM

## 2023-05-09 ENCOUNTER — Other Ambulatory Visit: Payer: Self-pay | Admitting: Family Medicine

## 2023-05-09 DIAGNOSIS — N63 Unspecified lump in unspecified breast: Secondary | ICD-10-CM

## 2023-05-13 ENCOUNTER — Telehealth (HOSPITAL_COMMUNITY): Payer: Self-pay

## 2023-05-13 NOTE — Telephone Encounter (Signed)
Pt called and VM was left in regards to [employer] [facility] Pulmonary wellness program.

## 2023-05-17 DIAGNOSIS — M3390 Dermatopolymyositis, unspecified, organ involvement unspecified: Secondary | ICD-10-CM | POA: Diagnosis not present

## 2023-05-17 DIAGNOSIS — E039 Hypothyroidism, unspecified: Secondary | ICD-10-CM | POA: Diagnosis not present

## 2023-05-17 DIAGNOSIS — E7800 Pure hypercholesterolemia, unspecified: Secondary | ICD-10-CM | POA: Diagnosis not present

## 2023-05-17 DIAGNOSIS — D72819 Decreased white blood cell count, unspecified: Secondary | ICD-10-CM | POA: Diagnosis not present

## 2023-05-24 ENCOUNTER — Encounter: Payer: Self-pay | Admitting: Pharmacist

## 2023-05-24 NOTE — Progress Notes (Signed)
[facility])     [facility] Quality Pharmacy Team Statin Quality Measure Assessment  05/24/2023  Susan Bray 18-Jul-1946 [id number]  Per review of chart and payor information, patient has a diagnosis of cardiovascular disease but is not currently filling a statin prescription. This places patient into the [facility] (Statin Use In Patients with Cardiovascular Disease) measure for CMS.    Patient has documented trials of statins with reported muscle pain and fatigue, but no corresponding CPT codes that would exclude patient from [facility] measure.     Component Value Date/Time   CHOL 149 11/15/2015 0901   TRIG 117 11/15/2015 0901   HDL 47 11/15/2015 0901   CHOLHDL 3.2 11/15/2015 0901   VLDL 23 11/15/2015 0901   LDLCALC 79 11/15/2015 0901    Please consider ONE of the following recommendations:  Initiate high intensity statin Atorvastatin 40mg  once daily, #90, 3 refills   Rosuvastatin 20mg  once daily, #90, 3 refills    Initiate moderate intensity  statin with reduced frequency if prior  statin intolerance 1x weekly, #13, 3 refills   2x weekly, #26, 3 refills   3x weekly, #39, 3 refills    Code for past statin intolerance  (required annually)  Provider Requirements: Must associate code during an office visit or telehealth encounter   Drug Induced Myopathy G72.0   Myalgia (SPC ONLY) M79.1   Myositis, unspecified M60.9   Myopathy, unspecified G72.9   Rhabdomyolysis M62.82   Thank you for allowing [facility] to be a part of this patient's care.   Reynold Bowen, PharmD Clinical Pharmacist [place] Direct Dial: [phone]

## 2023-05-26 NOTE — Progress Notes (Unsigned)
Cardiology Office Note:  .   Date:  05/27/2023  ID:  Susan Bray, DOB 1946-10-18, MRN [id number] PCP: Irven Coe, MD  [facility] Providers Cardiologist:  Rollene Rotunda, MD    History of Present Illness: .   Susan Bray is a 77 y.o. female with past medical history of paroxysmal atrial fibrillation, CAD, hyperlipidemia, ILD, thyroid disease, diverticulitis, and cataracts.   First seen by Dr. Antoine Poche in 09/2015 for palpitations in setting of neurologic issues. Cardiac monitor demonstrated no significant dysrhythmias, she also had echo stress testing that was negative. However with worsened dyspnea on exertion in 11/2015 cardiac catheterization was recommended, this indicated non obstructive and branch vessel disease, she was managed medically. She was then lost to follow up.   She returned in 09/2021 with increased dyspnea on exertion. In 04/2022 she underwent myoview that indicated low risk with no evidence of ischemia. While attending pulmonary rehab in 08/2022 she was noted to be in atrial fibrillation with rapid rate. She was started on Eliqius 5mg  twice daily and started on Cardizem 120mg  daily. Echo on 09/10/22 showed EF 60 to 65%, LV with normal function, no RWMA, no significant valvular abnormalities. She wore a cardiac monitor that showed no atrial fibrillation.   She was last seen in office on 01/24/23 by Dr. Antoine Poche for intermittent dizziness and hypotension, 100-90/40-30. Her cardizem was stopped in setting of hypotension. She had not had further tachy palpitations.   In 01/2023 she presented to the emergency department for right lower abdominal pain, her PCP had taken a CT scan the week prior indicating a diverticular abscess. Repeat scan indicated the size was decreasing, she was discharged with close GI follow up and oral antibiotics. GI recommended endoscopic evaluation given recurrent diverticulitis and macrocytic anemia, she declined.   Today she presents for follow up.  Notes that she was recently instructed by her PCP to stop Imuran for low white and red blood cell counts. She denies any problems with bleeding. She is planning to have repeat labs today with her PCP, if still abnormal will be referred to hematology. Since stopping her diltiazem in April she notes improved blood pressures but still endorses some intermittent dizziness. She denies any further tachy-palpitations. She endorses an occasional left sided chest pain that resolves with position changing, denies chest pain on exertion. She feels her shortness of breath has worsened in the last week since stopping Imuran, notes increased cough.   Labs 7/26 indicate WBC 1.9, RBC 3.40, HGB 10.5, HCT 31.7, RDW 18.1, LY# 0.40, NE# 1.1.   ROS: Today she denies fatigue, palpitations, melena, hematuria, hemoptysis, diaphoresis, weakness, presyncope, syncope, orthopnea, and PND.   Studies Reviewed: Marland Kitchen   EKG Interpretation Date/Time:  Monday May 27 2023 08:50:37 EDT Ventricular Rate:  61 PR Interval:  136 QRS Duration:  90 QT Interval:  412 QTC Calculation: 414 R Axis:   26  Text Interpretation: Normal sinus rhythm Nonspecific ST abnormality When compared with ECG of 15-Jan-2023 15:26, No significant change was found Confirmed by Reather Littler [id number] on 05/27/2023 9:29:45 AM   Cardiac Studies & Procedures   CARDIAC CATHETERIZATION  CARDIAC CATHETERIZATION 12/06/2015  Narrative 1. Ost 1st Diag to 1st Diag lesion, 80% stenosed. 2. Prox RCA lesion, 40% stenosed. 3. Prox LAD lesion, 60% stenosed. The lesion was not previously treated.   Heavily calcified left coronary including LAD and circumflex.  Eccentric proximal 40-60% (depending upon the view) LAD. 80% small-to-moderate sized diagonal.  Eccentric 40% RCA.  Normal  left ventricular function with ejection fraction 65%.  Recommendations:   Risk factor modification to slow/avoid progression of moderate disease noted.  Findings Coronary  Findings Diagnostic  Dominance: Right  Left Anterior Descending The lesion is type C Discrete eccentric located at the major branch. The lesion was not previously treated.  Pressure wire/FFR was not performed on the lesionIVUS was not performed on the lesion.  First Diagonal Branch The lesion is type C Eccentric.  Right Coronary Artery Eccentric.  Intervention  No interventions have been documented.   STRESS TESTS  MYOCARDIAL PERFUSION IMAGING 04/25/2022  Narrative   The study is normal. The study is low risk.   No ST deviation was noted.   LV perfusion is normal.   Left ventricular function is normal. Nuclear stress EF: 71 %. The left ventricular ejection fraction is hyperdynamic (>65%). End diastolic cavity size is normal.   Prior study available for comparison from 12/25/2012.   ECHOCARDIOGRAM  ECHOCARDIOGRAM COMPLETE 09/10/2022  Narrative ECHOCARDIOGRAM REPORT    Patient Name:   Susan Bray Kot  Date of Exam: 09/10/2022 Medical Rec #:  [id number]     Height:       63.0 in Accession #:    [id number]    Weight:       125.4 lb Date of Birth:  1945-12-01     BSA:          1.586 m Patient Age:    76 years      BP:           128/68 mmHg Patient Gender: F             HR:           62 bpm. Exam Location:  [address]  Procedure: 2D Echo, Cardiac Doppler, Color Doppler and Strain Analysis  Indications:    I48.91  History:        Patient has prior history of Echocardiogram examinations, most recent 10/19/2015. CAD, Signs/Symptoms:Dizziness/Lightheadedness, Shortness of Breath and Chest Pain; Risk Factors:Hypertension and HLD.  Sonographer:    Clearence Ped RCS Referring Phys: [id number] HOCHREIN  IMPRESSIONS   1. Left ventricular ejection fraction, by estimation, is 60 to 65%. The left ventricle has normal function. The left ventricle has no regional wall motion abnormalities. Left ventricular diastolic parameters were normal. 2. Right ventricular systolic function is  normal. The right ventricular size is normal. There is normal pulmonary artery systolic pressure. 3. The mitral valve is normal in structure. Trivial mitral valve regurgitation. No evidence of mitral stenosis. 4. The aortic valve is normal in structure. Aortic valve regurgitation is not visualized. No aortic stenosis is present. 5. The inferior vena cava is normal in size with greater than 50% respiratory variability, suggesting right atrial pressure of 3 mmHg.  FINDINGS Left Ventricle: Left ventricular ejection fraction, by estimation, is 60 to 65%. The left ventricle has normal function. The left ventricle has no regional wall motion abnormalities. The left ventricular internal cavity size was normal in size. There is no left ventricular hypertrophy. Left ventricular diastolic parameters were normal.  Right Ventricle: The right ventricular size is normal. No increase in right ventricular wall thickness. Right ventricular systolic function is normal. There is normal pulmonary artery systolic pressure. The tricuspid regurgitant velocity is 2.52 m/s, and with an assumed right atrial pressure of 3 mmHg, the estimated right ventricular systolic pressure is 28.4 mmHg.  Left Atrium: Left atrial size was normal in size.  Right Atrium: Right atrial size was  normal in size.  Pericardium: There is no evidence of pericardial effusion.  Mitral Valve: The mitral valve is normal in structure. Trivial mitral valve regurgitation. No evidence of mitral valve stenosis.  Tricuspid Valve: The tricuspid valve is normal in structure. Tricuspid valve regurgitation is mild . No evidence of tricuspid stenosis.  Aortic Valve: The aortic valve is normal in structure. Aortic valve regurgitation is not visualized. No aortic stenosis is present.  Pulmonic Valve: The pulmonic valve was normal in structure. Pulmonic valve regurgitation is not visualized. No evidence of pulmonic stenosis.  Aorta: The aortic root is normal  in size and structure.  Venous: The inferior vena cava is normal in size with greater than 50% respiratory variability, suggesting right atrial pressure of 3 mmHg.  IAS/Shunts: No atrial level shunt detected by color flow Doppler.   LEFT VENTRICLE PLAX 2D LVIDd:         4.80 cm   Diastology LVIDs:         3.00 cm   LV e' medial:    6.96 cm/s LV PW:         0.90 cm   LV E/e' medial:  9.7 LV IVS:        0.70 cm   LV e' lateral:   7.18 cm/s LVOT diam:     1.90 cm   LV E/e' lateral: 9.4 LV SV:         75 LV SV Index:   48 LVOT Area:     2.84 cm   RIGHT VENTRICLE RV Basal diam:  3.00 cm RV S prime:     14.00 cm/s TAPSE (M-mode): 1.8 cm RVSP:           28.4 mmHg  LEFT ATRIUM             Index        RIGHT ATRIUM           Index LA diam:        3.40 cm 2.14 cm/m   RA Pressure: 3.00 mmHg LA Vol (A2C):   31.4 ml 19.80 ml/m  RA Area:     10.60 cm LA Vol (A4C):   36.9 ml 23.27 ml/m  RA Volume:   21.20 ml  13.37 ml/m LA Biplane Vol: 34.3 ml 21.63 ml/m AORTIC VALVE LVOT Vmax:   126.00 cm/s LVOT Vmean:  74.600 cm/s LVOT VTI:    0.266 m  AORTA Ao Root diam: 3.00 cm Ao Asc diam:  3.00 cm  MITRAL VALVE               TRICUSPID VALVE MV Area (PHT):             TR Peak grad:   25.4 mmHg MV Decel Time:             TR Vmax:        252.00 cm/s MV E velocity: 67.70 cm/s  Estimated RAP:  3.00 mmHg MV A velocity: 64.30 cm/s  RVSP:           28.4 mmHg MV E/A ratio:  1.05 SHUNTS Systemic VTI:  0.27 m Systemic Diam: 1.90 cm  Kardie Tobb DO Electronically signed by Thomasene Ripple DO Signature Date/Time: 09/10/2022/12:24:39 PM    Final    MONITORS  LONG TERM MONITOR (3-14 DAYS) 09/10/2022  Narrative Predominant rhythm was normal sinus Rare runs of supraventricular tachycardia with the longest run being 13 beats No sustained arrhythmias   CT SCANS  CT CARDIAC SCORING (SELF  PAY ONLY) 10/19/2015  Addendum 10/19/2015 11:55 AM ADDENDUM REPORT: 10/19/2015 11:53 EXAM: OVER-READ  INTERPRETATION  CT CHEST The following report is an over-read performed by radiologist Dr. Deberah Pelton [facility] on 10/19/2015. This over-read does not include interpretation of cardiac or coronary anatomy or pathology. The coronary calcium score interpretation by the cardiologist is attached. COMPARISON:  None. FINDINGS: Visualized portions of lower lungs show subpleural interstitial prominence, scarring, and possible early honeycombing. This is suspicious for chronic interstitial lung disease, and may be due to pulmonary fibrosis. No mass, airspace disease, or pleural effusion seen in visualized portions of lungs. The visualized portions of the mediastinum and chest wall are unremarkable. IMPRESSION: Probable chronic subpleural interstitial lung disease. Pulmonary interstitial fibrosis cannot be excluded. Consider pulmonology consultation and high-resolution chest CT for further evaluation. These results will be called to the ordering clinician or representative by the Radiologist Assistant, and communication documented in the PACS or zVision Dashboard. Electronically Signed By: Myles Rosenthal M.D. On: 10/19/2015 11:53  Narrative : Risk stratification  EXAM: Coronary Calcium Score  TECHNIQUE: The patient was scanned on a Siemens Sensation 16 slice scanner. Axial non-contrast 3mm slices were carried out through the heart. The data set was analyzed on a dedicated work station and scored using the Agatson method.  FINDINGS: Non-cardiac: No significant non cardiac findings on limited lung and soft tissue windows. See separate report from [facility].  Ascending Aorta:  3.6 cm  Pericardium: Normal  Coronary arteries: Dense calcification in all 3 major epicardial coronary arteries proximal and mid vessels  IMPRESSION: Coronary calcium score of 852. This was 96th percentile for age and sex matched controls Consider f/u stress testing or functional  study  Charlton Haws  Electronically Signed: By: Charlton Haws M.D. On: 10/19/2015 09:39          Risk Assessment/Calculations:    CHA2DS2-VASc Score = 4   This indicates a 4.8% annual risk of stroke. The patient's score is based upon: CHF History: 0 HTN History: 1 Diabetes History: 0 Stroke History: 0 Vascular Disease History: 0 Age Score: 2 Gender Score: 1            Physical Exam:   VS:  BP 112/60   Pulse 61   Ht 5\' 2"  (1.575 m)   Wt 118 lb (53.5 kg)   SpO2 94%   BMI 21.58 kg/m    Wt Readings from Last 3 Encounters:  05/27/23 118 lb (53.5 kg)  03/27/23 122 lb 6 oz (55.5 kg)  02/01/23 122 lb 12.8 oz (55.7 kg)    GEN: Well nourished, well developed in no acute distress NECK: No JVD; No carotid bruits CARDIAC: RRR, no murmurs, rubs, gallops RESPIRATORY:  Clear to auscultation without rales, wheezing or rhonchi  ABDOMEN: Soft, non-tender, non-distended EXTREMITIES:  No edema; No deformity   ASSESSMENT AND PLAN: .    PAF: Noted to have been in atrial fibrillation during pulmonary rehab in 08/2022. She was started on Eliquis and Cardizem. At last visit in 01/2023 her Cardizem was stopping in setting of hypotension, she was in sinus rhythm.  Since stopping Cardizem she reports improvements in blood pressure although still has some intermittent dizziness, possibly related to anemia see below.  She denies any further tachypalpitations.  Reviewed ED precautions. Discussed that she does not meet criteria for reduced dosage of Eliquis.  Denies any bleeding. Continue Eliquis 5 mg twice daily.  CAD: Cardiac catheterization in 2017, this indicated non obstructive and branch vessel disease,  she was managed medically. In 04/2022 she underwent myoview that indicated low risk with no evidence of ischemia. She denies symptoms of angina, no ischemic workup indicated at this time. Continue Eliquis, Zetia and fenofibrate.   Dyslipidemia/Myalgia: Unable to the last lipid profile.  She  reports she will have a repeat done today by [facility].  She has a history of statin intolerance with myalgias.  She is currently on fenofibrate and Zetia.  Monitored and managed by her PCP.  Anemia: Baseline hemoglobin 13.6, decreased to 11.4 in March and 10.7 in April.  Currently being monitored and evaluated by PCP.  She was instructed to stop Imuran last week, she will have repeat labs today, if continue to be abnormal she will be referred to hematology by her PCP.  Discussed that at this time her Eliquis dosage should not be decreased or stopped.  Labs 7/26 indicate WBC 1.9, RBC 3.40, HGB 10.5, HCT 31.7, RDW 18.1, LY# 0.40, NE# 1.1.   Hypothyroidism: Unable to view most recent TSH.  Monitored and managed by PCP.  Continue Synthroid.  Diverticulitis: She reports improvement in symptoms of diverticulitis.  Monitored and managed by GI and PCP.  Endoscopy previously recommended by GI, patient deferred.  ILD: Instructed last week to stop Imuran in setting of abnormal CBC.  Notes some slightly worsened shortness of breath and cough since stopping.  She has follow-up with her PCP today.    Dispo: Follow-up with Dr. Antoine Poche or Bernadene Person, NP in 3-4 months or sooner if needed.   Signed, Rip Harbour, NP

## 2023-05-27 ENCOUNTER — Encounter: Payer: Self-pay | Admitting: Nurse Practitioner

## 2023-05-27 ENCOUNTER — Ambulatory Visit: Payer: PPO | Attending: Nurse Practitioner | Admitting: Cardiology

## 2023-05-27 VITALS — BP 112/60 | HR 61 | Ht 62.0 in | Wt 118.0 lb

## 2023-05-27 DIAGNOSIS — E782 Mixed hyperlipidemia: Secondary | ICD-10-CM | POA: Diagnosis not present

## 2023-05-27 DIAGNOSIS — D72819 Decreased white blood cell count, unspecified: Secondary | ICD-10-CM | POA: Diagnosis not present

## 2023-05-27 DIAGNOSIS — I2510 Atherosclerotic heart disease of native coronary artery without angina pectoris: Secondary | ICD-10-CM

## 2023-05-27 DIAGNOSIS — I10 Essential (primary) hypertension: Secondary | ICD-10-CM | POA: Diagnosis not present

## 2023-05-27 DIAGNOSIS — G720 Drug-induced myopathy: Secondary | ICD-10-CM | POA: Diagnosis not present

## 2023-05-27 DIAGNOSIS — D649 Anemia, unspecified: Secondary | ICD-10-CM | POA: Diagnosis not present

## 2023-05-27 DIAGNOSIS — I480 Paroxysmal atrial fibrillation: Secondary | ICD-10-CM

## 2023-05-27 DIAGNOSIS — D6859 Other primary thrombophilia: Secondary | ICD-10-CM | POA: Diagnosis not present

## 2023-05-27 DIAGNOSIS — J849 Interstitial pulmonary disease, unspecified: Secondary | ICD-10-CM | POA: Diagnosis not present

## 2023-05-27 DIAGNOSIS — E039 Hypothyroidism, unspecified: Secondary | ICD-10-CM | POA: Diagnosis not present

## 2023-05-27 DIAGNOSIS — E7800 Pure hypercholesterolemia, unspecified: Secondary | ICD-10-CM | POA: Diagnosis not present

## 2023-05-27 NOTE — Patient Instructions (Signed)
Medication Instructions:  Your physician recommends that you continue on your current medications as directed. Please refer to the Current Medication list given to you today.  *If you need a refill on your cardiac medications before your next appointment, please call your pharmacy*   Lab Work: NONE ordered at this time of appointment     Testing/Procedures: NONE ordered at this time of appointment     Follow-Up: At [facility], you and your health needs are our priority.  As part of our continuing mission to provide you with exceptional heart care, we have created designated Provider Care Teams.  These Care Teams include your primary Cardiologist (physician) and Advanced Practice Providers (APPs -  Physician Assistants and Nurse Practitioners) who all work together to provide you with the care you need, when you need it.  We recommend signing up for the patient portal called "MyChart".  Sign up information is provided on this After Visit Summary.  MyChart is used to connect with patients for Virtual Visits (Telemedicine).  Patients are able to view lab/test results, encounter notes, upcoming appointments, etc.  Non-urgent messages can be sent to your provider as well.   To learn more about what you can do with MyChart, go to [email or web address].    Your next appointment:   3-4 month(s)  Provider:   Rollene Rotunda, MD  or Bernadene Person, NP

## 2023-06-03 DIAGNOSIS — Z79899 Other long term (current) drug therapy: Secondary | ICD-10-CM | POA: Diagnosis not present

## 2023-06-11 ENCOUNTER — Encounter: Payer: Self-pay | Admitting: Family Medicine

## 2023-06-11 DIAGNOSIS — D72819 Decreased white blood cell count, unspecified: Secondary | ICD-10-CM | POA: Diagnosis not present

## 2023-06-13 ENCOUNTER — Encounter: Payer: Self-pay | Admitting: Gastroenterology

## 2023-06-18 DIAGNOSIS — J849 Interstitial pulmonary disease, unspecified: Secondary | ICD-10-CM | POA: Diagnosis not present

## 2023-06-18 DIAGNOSIS — Z79899 Other long term (current) drug therapy: Secondary | ICD-10-CM | POA: Diagnosis not present

## 2023-06-18 DIAGNOSIS — R682 Dry mouth, unspecified: Secondary | ICD-10-CM | POA: Diagnosis not present

## 2023-06-18 DIAGNOSIS — R5383 Other fatigue: Secondary | ICD-10-CM | POA: Diagnosis not present

## 2023-06-18 DIAGNOSIS — M3390 Dermatopolymyositis, unspecified, organ involvement unspecified: Secondary | ICD-10-CM | POA: Diagnosis not present

## 2023-06-18 DIAGNOSIS — M810 Age-related osteoporosis without current pathological fracture: Secondary | ICD-10-CM | POA: Diagnosis not present

## 2023-06-18 DIAGNOSIS — R634 Abnormal weight loss: Secondary | ICD-10-CM | POA: Diagnosis not present

## 2023-06-18 DIAGNOSIS — M1990 Unspecified osteoarthritis, unspecified site: Secondary | ICD-10-CM | POA: Diagnosis not present

## 2023-06-18 DIAGNOSIS — H04129 Dry eye syndrome of unspecified lacrimal gland: Secondary | ICD-10-CM | POA: Diagnosis not present

## 2023-06-18 DIAGNOSIS — M3500 Sicca syndrome, unspecified: Secondary | ICD-10-CM | POA: Diagnosis not present

## 2023-06-18 DIAGNOSIS — R21 Rash and other nonspecific skin eruption: Secondary | ICD-10-CM | POA: Diagnosis not present

## 2023-06-26 NOTE — Progress Notes (Signed)
[facility] [phone]   [phone]  CONSULT NOTE  REFERRING PHYSICIAN: Dr. Lewie Chamber  REASON FOR CONSULTATION:  Leukopenia   HPI Susan Bray is a 77 y.o. female past medical history significant for hypothyroidism, vitamin D deficiency, allergic rhinitis, HPV, vitamin B-12 deficiency, atrial fibrillation, dermatomyositis, interstitial lung disease, and Sjogren syndrome is referred to the clinic for leukopenia.  The leukopenia was first noticed on labs from June 2024 with a WBC of 2.3. Her Hbg was 10.5. She repeat labs on 05/21/23 showing neutropenia/leukopenia with a WBC low at 1.9, ANC of 1.1, and Hbg of 10.5. Her platelet count was normal at 306k. Her azathioprine as held at this point, which she takes for ILD and Sjogrens. She had been on this about 6-8 months by her rheumatologist, Dr. Deanne Coffer. She saw her PCP from [facility] on 06/11/2023 to follow-up on leukopenia. Her labs were rechecked on 06/11/23 which were improved but continued to show persistently low WBC of 3.0 and improved but persistently low Hgb of 11.0. Her platelet count is normal at 199k. The ANC was low at 1.3. She was referred to the clinic today for further evaluation and management of this.  She has continued to hold her azathioprine.   Overall, the oldest CBC records I have are from 2015 which was normal at that time. To her knowledge, this is the first time she was told she had leukopenia. In February 2017 her WBC was mildly low at 3.7. This improved after that one time. Starting in March 2024, she started having mild anemia with a Hbg of 11.4, which gradually worsened in April to 10.7 while her WBC remained normal.   Of note, she had diverticulitis and diverticular abscess in April 2024. She had been following with GI. She is feeling better at this time.    The patient is overall feeling "not too bad"  today without any concerning complaints.  She mentions that with all her autoimmune  conditions, that she has some good days and bad days with regard to her fatigue.  She very seldomly takes over-the-counter medication with NSAIDs.  She states one of the only over-the-counter medication she takes is Mucinex.  She sees Dr. Isaiah Serge from pulmonary medicine for her ILD.  He denies any herbal supplement use.  She reports she has a history of vitamin D, vitamin B12, and folate deficiency.  She is not on any B12 or folate supplements at this time.    Denies any recent illnesses besides having the diverticulitis and abscess in April 2024.  She also reports she had a UTI around Christmas of 2023.  Otherwise she denies frequent infections.  She denies any history of bariatric surgeries.  She reports had a partial hysterectomy.  Denies any unusual rashes or joint swelling at this time.  Denies any history of hepatitis, mono, HIV, bacterial, or parasitic infections.  Denies any lymphadenopathy. Denies any recurrent mouth ulcers. Denies any history of liver disease.  She reports that she was told she has IBS.  Denies any particular dietary habits such as being a vegan or vegetarian; however, she very fairly rarely eats red meat due to preference.  She denies any abnormal bleeding or bruising.    The patient's mother had breast cancer and congestive heart failure.  Her father had a myocardial infarction.  Her brother had open heart surgery for coronary blockage.  Her sister had a carotid endarterectomy  The patient used to work in Aud[employer].  She  is divorced.  She has 3 children.  Denies any smoking, drug, or alcohol use.   HPI  Past Medical History:  Diagnosis Date   Atrial fibrillation (HCC)    CAD (coronary artery disease)    Cataract    Diverticulitis    Hyperlipidemia    IBS (irritable bowel syndrome)    Thyroid disease    VIN III (vulvar intraepithelial neoplasia III)     Past Surgical History:  Procedure Laterality Date   ABDOMINAL HYSTERECTOMY  1978   CARDIAC CATHETERIZATION  N/A 12/06/2015   Procedure: Left Heart Cath and Coronary Angiography;  Surgeon: Lyn Records, MD;  Location: [facility] INVASIVE CV LAB;  Service: Cardiovascular;  Laterality: N/A;   CATARACT EXTRACTION  08/17/04 ; 09/12/2004   left eye and right eye   CATARACT EXTRACTION     laser vaporization of hg vaginal dysplasia  12/2006   p & e repairs with cardinal uterosatral coloposusension (pelvic area repair)  03/03/09   severe dysplasia near rectum  03/18/2013   tummy tuck  1982   upper colpectomy  09/1993    Family History  Problem Relation Age of Onset   Arthritis Mother    Cancer Mother        breast   Hypertension Mother    Heart attack Mother 30   Breast cancer Mother 32   Heart attack Father 28   Peripheral vascular disease Sister 79   CAD Brother 64   Heart disease Maternal Grandmother    Colon cancer Neg Hx    Stomach cancer Neg Hx    Esophageal cancer Neg Hx    Colonic polyp Neg Hx     Social History Social History   Tobacco Use   Smoking status: Never   Smokeless tobacco: Never  Vaping Use   Vaping status: Never Used  Substance Use Topics   Alcohol use: No    Alcohol/week: 0.0 standard drinks of alcohol   Drug use: No    Allergies  Allergen Reactions   Colesevelam Other (See Comments)    Muscle and nerve pain/ damage in her arm    Statins Other (See Comments)    Muscle aches and fatigue.     Current Outpatient Medications  Medication Sig Dispense Refill   apixaban (ELIQUIS) 5 MG TABS tablet Take 1 tablet (5 mg total) by mouth 2 (two) times daily. 200 tablet 2   clobetasol ointment (TEMOVATE) 0.05 % Apply topically 2 (two) times daily.     ezetimibe (ZETIA) 10 MG tablet Take 10 mg by mouth daily.     fenofibrate micronized (LOFIBRA) 134 MG capsule Take 134 mg by mouth daily before breakfast.     predniSONE (DELTASONE) 5 MG tablet Take 5 mg by mouth daily.     SYNTHROID 50 MCG tablet Take by mouth.     azaTHIOprine (IMURAN) 50 MG tablet Take 3 tablets (150 mg  total) by mouth daily. (Patient not taking: Reported on 05/27/2023) 90 tablet 11   dicyclomine (BENTYL) 20 MG tablet Take 1 tablet (20 mg total) by mouth every 6 (six) hours as needed for spasms. (Patient not taking: Reported on 06/29/2023) 30 tablet 3   metroNIDAZOLE (FLAGYL) 500 MG tablet Take 1 tablet (500 mg total) by mouth 4 (four) times daily. 28 tablet 0   No current facility-administered medications for this visit.    REVIEW OF SYSTEMS:   Review of Systems  Constitutional: Positive for intermittent fatigue. Negative for appetite change, chills, fever and unexpected weight  change.  HENT: Negative for mouth sores, nosebleeds, sore throat and trouble swallowing.   Eyes: Negative for eye problems and icterus.  Respiratory: Negative for cough, hemoptysis, shortness of breath and wheezing.   Cardiovascular: Negative for chest pain and leg swelling.  Gastrointestinal: Negative for abdominal pain, constipation, diarrhea, nausea and vomiting.  Genitourinary: Negative for bladder incontinence, difficulty urinating, dysuria, frequency and hematuria.   Musculoskeletal: Negative for back pain, gait problem, neck pain and neck stiffness.  Skin: Negative for itching and rash.  Neurological: Negative for dizziness, extremity weakness, gait problem, headaches, light-headedness and seizures.  Hematological: Negative for adenopathy. Does not bruise/bleed easily.  Psychiatric/Behavioral: Negative for confusion, depression and sleep disturbance. The patient is not nervous/anxious.     PHYSICAL EXAMINATION:  Blood pressure (!) 129/58, pulse (!) 58, temperature 98.1 F (36.7 C), resp. rate 18, weight 118 lb 11.2 oz (53.8 kg), SpO2 100%.  ECOG PERFORMANCE STATUS: 1  Physical Exam  Constitutional: Oriented to person, place, and time and well-developed, well-nourished, and in no distress.  HENT:  Head: Normocephalic and atraumatic.  Mouth/Throat: Oropharynx is clear and moist. No oropharyngeal exudate.   Eyes: Conjunctivae are normal. Right eye exhibits no discharge. Left eye exhibits no discharge. No scleral icterus.  Neck: Normal range of motion. Neck supple.  Cardiovascular: Normal rate, regular rhythm, normal heart sounds and intact distal pulses.   Pulmonary/Chest: Effort normal and breath sounds normal. No respiratory distress. No wheezes. No rales.  Abdominal: Soft. Bowel sounds are normal. Exhibits no distension and no mass. There is no tenderness.  Musculoskeletal: Normal range of motion. Exhibits no edema.  Lymphadenopathy:    No cervical adenopathy.  Neurological: Alert and oriented to person, place, and time. Exhibits normal muscle tone. Gait normal. Coordination normal.  Skin: Skin is warm and dry. No rash noted. Not diaphoretic. No erythema. No pallor.  Psychiatric: Mood, memory and judgment normal.  Vitals reviewed.  LABORATORY DATA: Lab Results  Component Value Date   WBC 4.1 06/29/2023   HGB 12.4 06/29/2023   HCT 37.1 06/29/2023   MCV 92.3 06/29/2023   PLT 215 06/29/2023      Chemistry      Component Value Date/Time   NA 142 06/29/2023 0754   NA 142 08/13/2017 1623   K 3.6 06/29/2023 0754   CL 106 06/29/2023 0754   CO2 29 06/29/2023 0754   BUN 22 06/29/2023 0754   BUN 11 08/13/2017 1623   CREATININE 1.01 (H) 06/29/2023 0754   CREATININE 0.81 12/05/2015 0836      Component Value Date/Time   CALCIUM 9.8 06/29/2023 0754   ALKPHOS 36 (L) 06/29/2023 0754   AST 17 06/29/2023 0754   ALT 9 06/29/2023 0754   BILITOT 0.6 06/29/2023 0754       RADIOGRAPHIC STUDIES: No results found.  ASSESSMENT: This is a very pleasant 77 year old Caucasian female referred to the clinic for leukopenia and anemia.  She has multiple medical problems and autoimmune disorders.  She is on immunosuppressive treatment with azathioprine and she is followed closely by Dr.Aryal. She also sees Dr. Isaiah Serge for her ILD.   The patient had several lab studies today including a CBC, CMP,  iron studies, ferritin, B12, folate, SPEP, reticulocyte count, haptoglobin, and LDH.   The patient was seen with Dr. Arbutus Ped today.  Her CBC is actually within the normal range today.  Her hemoglobin is normal at 12.4 and her white blood cell count is just above the normal range at 4.1.  Her ANC  is also normal at 2.4.  Her CMP is unremarkable.  Her creatinine is stable around 1.01.  Her reticulocyte count and immature reticulocyte fraction are normal.  Her other labs are pending at this time.  Dr. Arbutus Ped feels that her leukopenia and anemia were from immune suppression from her azathioprine.  However, we will call the patient if there is any other abnormalities on her pending lab work from today for further instruction.  Dr. Arbutus Ped explained that it may have taken some time for her bone marrow to recover from the immunosuppressive drugs.  She may want to talk to her prescribing provider about the decision regarding resuming this or if alternatives or dose adjustment are required.  If she does resume taking this, she will likely need her labs checked closely.  Will not arrange for any routine follow-ups with the patient but would be happy to see her on an as needed basis  The patient voices understanding of current disease status and treatment options and is in agreement with the current care plan.  All questions were answered. The patient knows to call the clinic with any problems, questions or concerns. We can certainly see the patient much sooner if necessary.  Thank you so much for allowing me to participate in the care of Susan Bray. I will continue to follow up the patient with you and assist in her care.   Disclaimer: This note was dictated with voice recognition software. Similar sounding words can inadvertently be transcribed and may not be corrected upon review.   Nihal Marzella L Fatimata Talsma June 29, 2023, 9:20 AM  ADDENDUM: Hematology/Oncology Attending: I had a face-to-face  encounter with the patient today.  I reviewed her record, lab and recommended her care plan.  This is a very pleasant 77 years old white female with history of hypothyroidism, allergic rhinitis, HPV, vitamin B12 deficiency, atrial fibrillation, dermatomycosis, interstitial lung disease, Sjogren as well as vitamin D deficiency.  The patient was seen by her primary care provider in June 2024 for routine evaluation and close monitoring of her treatment with azathioprine for her rheumatologic disorder.  She was noted to have low white blood count of 2.3 and hemoglobin was down to 10.5.  Repeat CBC and months later showed persistent leukocytopenia with total white blood count of 1.9 and absolute neutrophil count of 1100 with persistent anemia and hemoglobin of 10.5 but normal platelets count.  The patient was advised to hold her treatment with azathioprine at that point and repeat blood work in August 2024 showed some improvement of her total white blood count but not back to normal her total white blood count was 3.0 with absolute neutrophil count of 1300 and hemoglobin of 11.0.  The patient was referred to [place] today for evaluation and recommendation regarding her condition. We ordered several studies today for evaluation of her condition including repeat CBC that showed normal total white blood count of 4.1, hemoglobin was normal at 12.4 and hematocrit 37.1% and platelets count of 215,000.  Comprehensive metabolic panel was unremarkable except for slightly elevated serum creatinine of 1.01 and decreased alkaline phosphatase.  She has normal serum iron and ferritin.  She has normal LDH, serum folate and vitamin B12. I discussed the result with the patient and explained to her that her pancytopenia was secondary to her treatment with azathioprine which is a known side effect of the drug. She will need to discuss with her rheumatologist adjustment of her dose or use of alternative treatment.  Once the  patient started  treatment again it is expected that she may have drop in her blood count but this is expected and should be monitored closely. I do not see a need for the patient to continue routine follow-up visit with me at this point since this is drug-induced pancytopenia and its completely resolved at this point. The patient is in agreement with the current plan. She was advised to call if she has any concerning symptoms in the interval. The total time spent in the appointment was 60 minutes. Disclaimer: This note was dictated with voice recognition software. Similar sounding words can inadvertently be transcribed and may be missed upon review. Lajuana Matte, MD

## 2023-06-28 ENCOUNTER — Other Ambulatory Visit: Payer: Self-pay | Admitting: Physician Assistant

## 2023-06-28 DIAGNOSIS — D709 Neutropenia, unspecified: Secondary | ICD-10-CM

## 2023-06-28 DIAGNOSIS — D649 Anemia, unspecified: Secondary | ICD-10-CM

## 2023-06-29 ENCOUNTER — Other Ambulatory Visit: Payer: Self-pay

## 2023-06-29 ENCOUNTER — Inpatient Hospital Stay: Payer: PPO

## 2023-06-29 ENCOUNTER — Inpatient Hospital Stay: Payer: PPO | Attending: Physician Assistant | Admitting: Physician Assistant

## 2023-06-29 VITALS — BP 129/58 | HR 58 | Temp 98.1°F | Resp 18 | Wt 118.7 lb

## 2023-06-29 DIAGNOSIS — D72819 Decreased white blood cell count, unspecified: Secondary | ICD-10-CM | POA: Insufficient documentation

## 2023-06-29 DIAGNOSIS — D649 Anemia, unspecified: Secondary | ICD-10-CM | POA: Insufficient documentation

## 2023-06-29 DIAGNOSIS — E559 Vitamin D deficiency, unspecified: Secondary | ICD-10-CM | POA: Diagnosis not present

## 2023-06-29 DIAGNOSIS — E538 Deficiency of other specified B group vitamins: Secondary | ICD-10-CM | POA: Diagnosis not present

## 2023-06-29 DIAGNOSIS — D709 Neutropenia, unspecified: Secondary | ICD-10-CM

## 2023-06-29 LAB — CBC WITH DIFFERENTIAL (CANCER CENTER ONLY)
Abs Immature Granulocytes: 0.02 10*3/uL (ref 0.00–0.07)
Basophils Absolute: 0 10*3/uL (ref 0.0–0.1)
Basophils Relative: 1 %
Eosinophils Absolute: 0.2 10*3/uL (ref 0.0–0.5)
Eosinophils Relative: 5 %
HCT: 37.1 % (ref 36.0–46.0)
Hemoglobin: 12.4 g/dL (ref 12.0–15.0)
Immature Granulocytes: 1 %
Lymphocytes Relative: 25 %
Lymphs Abs: 1.1 10*3/uL (ref 0.7–4.0)
MCH: 30.8 pg (ref 26.0–34.0)
MCHC: 33.4 g/dL (ref 30.0–36.0)
MCV: 92.3 fL (ref 80.0–100.0)
Monocytes Absolute: 0.4 10*3/uL (ref 0.1–1.0)
Monocytes Relative: 10 %
Neutro Abs: 2.4 10*3/uL (ref 1.7–7.7)
Neutrophils Relative %: 58 %
Platelet Count: 215 10*3/uL (ref 150–400)
RBC: 4.02 MIL/uL (ref 3.87–5.11)
RDW: 13.7 % (ref 11.5–15.5)
WBC Count: 4.1 10*3/uL (ref 4.0–10.5)
nRBC: 0 % (ref 0.0–0.2)

## 2023-06-29 LAB — FOLATE: Folate: 8 ng/mL (ref >5.9)

## 2023-06-29 LAB — CMP (CANCER CENTER ONLY)
ALT: 9 U/L (ref 0–44)
AST: 17 U/L (ref 15–41)
Albumin: 4.3 g/dL (ref 3.5–5.0)
Alkaline Phosphatase: 36 U/L — ABNORMAL LOW (ref 38–126)
Anion gap: 7 (ref 5–15)
BUN: 22 mg/dL (ref 8–23)
CO2: 29 mmol/L (ref 22–32)
Calcium: 9.8 mg/dL (ref 8.9–10.3)
Chloride: 106 mmol/L (ref 98–111)
Creatinine: 1.01 mg/dL — ABNORMAL HIGH (ref 0.44–1.00)
GFR, Estimated: 58 mL/min — ABNORMAL LOW (ref >60)
Glucose, Bld: 82 mg/dL (ref 70–99)
Potassium: 3.6 mmol/L (ref 3.5–5.1)
Sodium: 142 mmol/L (ref 135–145)
Total Bilirubin: 0.6 mg/dL (ref 0.3–1.2)
Total Protein: 7.2 g/dL (ref 6.5–8.1)

## 2023-06-29 LAB — LACTATE DEHYDROGENASE: LDH: 149 U/L (ref 98–192)

## 2023-06-29 LAB — IRON AND IRON BINDING CAPACITY (CC-WL,HP ONLY)
Iron: 103 ug/dL (ref 28–170)
Saturation Ratios: 27 % (ref 10.4–31.8)
TIBC: 384 ug/dL (ref 250–450)
UIBC: 281 ug/dL (ref 148–442)

## 2023-06-29 LAB — RETICULOCYTES
Immature Retic Fract: 11.5 % (ref 2.3–15.9)
RBC.: 4 MIL/uL (ref 3.87–5.11)
Retic Count, Absolute: 76.8 10*3/uL (ref 19.0–186.0)
Retic Ct Pct: 1.9 % (ref 0.4–3.1)

## 2023-06-29 LAB — FERRITIN: Ferritin: 213 ng/mL (ref 11–307)

## 2023-06-29 LAB — VITAMIN B12: Vitamin B-12: 181 pg/mL (ref 180–914)

## 2023-07-01 ENCOUNTER — Telehealth: Payer: Self-pay

## 2023-07-01 ENCOUNTER — Other Ambulatory Visit: Payer: Self-pay | Admitting: Medical Genetics

## 2023-07-01 DIAGNOSIS — Z006 Encounter for examination for normal comparison and control in clinical research program: Secondary | ICD-10-CM

## 2023-07-01 NOTE — Telephone Encounter (Signed)
While talking to pt on the phone, she requested that her recent lab work be faxed to her PCP E. Lewie Chamber, MD and two other specialists. Advised that labs would be sent to Dr. Lewie Chamber, but she would need to contact their office in order to have labs sent elsewhere. She verbalizes understanding. Recent labs faxed to Dr. Bufford Lope office per request and receipt of fax confirmation received.

## 2023-07-01 NOTE — Telephone Encounter (Signed)
Per C. Heilingoetter, PA, pt called and notified to start taking Vit B12 [phone] mcg daily. Pt verbalizes understanding, reporting that she has taken PO Vit B12 in the past.

## 2023-07-02 DIAGNOSIS — M1990 Unspecified osteoarthritis, unspecified site: Secondary | ICD-10-CM | POA: Diagnosis not present

## 2023-07-02 DIAGNOSIS — M7989 Other specified soft tissue disorders: Secondary | ICD-10-CM | POA: Diagnosis not present

## 2023-07-02 LAB — HAPTOGLOBIN: Haptoglobin: 78 mg/dL (ref 42–346)

## 2023-07-03 ENCOUNTER — Other Ambulatory Visit: Payer: PPO

## 2023-07-03 LAB — PROTEIN ELECTROPHORESIS, SERUM, WITH REFLEX
A/G Ratio: 1.2 (ref 0.7–1.7)
Albumin ELP: 3.7 g/dL (ref 2.9–4.4)
Alpha-1-Globulin: 0.3 g/dL (ref 0.0–0.4)
Alpha-2-Globulin: 0.6 g/dL (ref 0.4–1.0)
Beta Globulin: 1 g/dL (ref 0.7–1.3)
Gamma Globulin: 1.2 g/dL (ref 0.4–1.8)
Globulin, Total: 3.1 g/dL (ref 2.2–3.9)
Total Protein ELP: 6.8 g/dL (ref 6.0–8.5)

## 2023-07-09 ENCOUNTER — Ambulatory Visit: Payer: PPO

## 2023-07-09 ENCOUNTER — Ambulatory Visit
Admission: RE | Admit: 2023-07-09 | Discharge: 2023-07-09 | Disposition: A | Discharge status: Home / Self Care | Payer: PPO | Source: Ambulatory Visit | Attending: Family Medicine | Admitting: Family Medicine

## 2023-07-09 DIAGNOSIS — N6341 Unspecified lump in right breast, subareolar: Secondary | ICD-10-CM | POA: Diagnosis not present

## 2023-07-09 DIAGNOSIS — N63 Unspecified lump in unspecified breast: Secondary | ICD-10-CM

## 2023-07-13 ENCOUNTER — Encounter: Payer: Self-pay | Admitting: Pulmonary Disease

## 2023-07-16 DIAGNOSIS — Z01419 Encounter for gynecological examination (general) (routine) without abnormal findings: Secondary | ICD-10-CM | POA: Diagnosis not present

## 2023-07-16 DIAGNOSIS — Z124 Encounter for screening for malignant neoplasm of cervix: Secondary | ICD-10-CM | POA: Diagnosis not present

## 2023-07-16 DIAGNOSIS — Z8742 Personal history of other diseases of the female genital tract: Secondary | ICD-10-CM | POA: Diagnosis not present

## 2023-07-16 DIAGNOSIS — Z114 Encounter for screening for human immunodeficiency virus [HIV]: Secondary | ICD-10-CM | POA: Diagnosis not present

## 2023-07-16 DIAGNOSIS — R87610 Atypical squamous cells of undetermined significance on cytologic smear of cervix (ASC-US): Secondary | ICD-10-CM | POA: Diagnosis not present

## 2023-07-17 DIAGNOSIS — Z79899 Other long term (current) drug therapy: Secondary | ICD-10-CM | POA: Diagnosis not present

## 2023-07-30 DIAGNOSIS — R682 Dry mouth, unspecified: Secondary | ICD-10-CM | POA: Diagnosis not present

## 2023-07-30 DIAGNOSIS — H04129 Dry eye syndrome of unspecified lacrimal gland: Secondary | ICD-10-CM | POA: Diagnosis not present

## 2023-07-30 DIAGNOSIS — M1990 Unspecified osteoarthritis, unspecified site: Secondary | ICD-10-CM | POA: Diagnosis not present

## 2023-07-30 DIAGNOSIS — R5383 Other fatigue: Secondary | ICD-10-CM | POA: Diagnosis not present

## 2023-07-30 DIAGNOSIS — J849 Interstitial pulmonary disease, unspecified: Secondary | ICD-10-CM | POA: Diagnosis not present

## 2023-07-30 DIAGNOSIS — M3390 Dermatopolymyositis, unspecified, organ involvement unspecified: Secondary | ICD-10-CM | POA: Diagnosis not present

## 2023-07-30 DIAGNOSIS — R21 Rash and other nonspecific skin eruption: Secondary | ICD-10-CM | POA: Diagnosis not present

## 2023-07-30 DIAGNOSIS — M810 Age-related osteoporosis without current pathological fracture: Secondary | ICD-10-CM | POA: Diagnosis not present

## 2023-07-30 DIAGNOSIS — Z79899 Other long term (current) drug therapy: Secondary | ICD-10-CM | POA: Diagnosis not present

## 2023-07-30 DIAGNOSIS — R300 Dysuria: Secondary | ICD-10-CM | POA: Diagnosis not present

## 2023-07-30 DIAGNOSIS — M3500 Sicca syndrome, unspecified: Secondary | ICD-10-CM | POA: Diagnosis not present

## 2023-07-31 ENCOUNTER — Encounter: Payer: Self-pay | Admitting: Pulmonary Disease

## 2023-07-31 ENCOUNTER — Ambulatory Visit: Payer: PPO | Admitting: Pulmonary Disease

## 2023-07-31 VITALS — BP 134/69 | HR 57 | Ht 63.0 in | Wt 122.0 lb

## 2023-07-31 DIAGNOSIS — Z5181 Encounter for therapeutic drug level monitoring: Secondary | ICD-10-CM | POA: Diagnosis not present

## 2023-07-31 DIAGNOSIS — J849 Interstitial pulmonary disease, unspecified: Secondary | ICD-10-CM

## 2023-07-31 NOTE — Patient Instructions (Addendum)
VISIT SUMMARY:  During your recent visit, we discussed your Interstitial Lung Disease (ILD), a condition that causes scarring in your lungs. We talked about your recent switch from azathioprine to Cellcept due to a drop in your white blood cell count. You mentioned new symptoms like leg cramps, night sweats, and a worsening cough since starting Cellcept. You also expressed concerns about the extent of lung scarring and the severity of your disease. We discussed the possibility of increasing your Cellcept dose based on your side effects and blood test results.  YOUR PLAN:  -INTERSTITIAL LUNG DISEASE (ILD): You will continue taking Cellcept 500mg  twice a day. Rheumatology will monitor your side effects and blood cell count in 6 weeks. Depending on these results, they may consider increasing your Cellcept dose. We will also follow up in 3 months  -REFERRAL FOR SECOND OPINION: You expressed a desire for a second opinion regarding your ILD management. We will refer you to [facility] for this purpose.  INSTRUCTIONS:  Please continue taking your Cellcept 500mg  twice a day. Monitor any side effects you experience and report them to [place]. We will also schedule a follow-up appointment in 3 months. In the meantime, we will arrange a referral to [facility] for a second opinion on your ILD management.

## 2023-07-31 NOTE — Addendum Note (Signed)
Addended by: Sarina Ser R on: 07/31/2023 09:00 AM   Modules accepted: Orders

## 2023-07-31 NOTE — Progress Notes (Signed)
Susan Bray    [id number]    11-Feb-1946  Primary Care Physician:Hammer, Theone Murdoch, MD  Referring Physician: Irven Coe, MD [address] [place],  [place] [address]  Chief complaint: Follow-up for interstitial lung disease  HPI: 77 y.o. who  has a past medical history of Atrial fibrillation (HCC), CAD (coronary artery disease), Cataract, Diverticulitis, Hyperlipidemia, IBS (irritable bowel syndrome), Thyroid disease, and VIN III (vulvar intraepithelial neoplasia III).   She has Sjogren's syndrome and dermatomyositis with initial complaints of skin thickening, joint stiffness, dyspnea and cough.  Diagnosis made in June 2023 by Dr. Deanne Coffer.  Initially tried on prednisone and methotrexate.  Methotrexate was stopped for unclear reason.    Imaging shows interstitial lung disease and she has been referred here for further evaluation She was started on azathioprine 50 mg/day in July 2023 and this was increased to 100 mg/day in August 2023 and to 150 mg/day on October 2023. Azathioprine has been denied by insurance and spite of multiple appeals.  She is paying out-of-pocket with good Rx card, costs $30 a month.  Her symptoms have improved with the treatment Continues on prednisone per Dr. Leone Brand pulmonary rehab She had an episode of paroxysmal atrial fibrillation in November 2023 while in pulmonary rehab.  Has followed up with Dr. Antoine Poche from cardiology.  Started on Eliquis and Cardizem.  Pets: No pets Occupation: Retired Airline pilot Exposures: No mold, hot tub, Jacuzzi.  No feather pillows or comforters Smoking history: Never smoker Travel history: Significant travel history Relevant family history: No family history of lung disease  Interim history: Discussed the use of AI scribe software for clinical note transcription with the patient, who gave verbal consent to proceed.  The patient, with a history of Interstitial Lung Disease (ILD), presents for follow-up  after a recent medication change. He was previously on azathioprine, but this was discontinued due to a significant drop in his white blood cell count. He has since been started on Cellcept (500mg  twice daily), which he has been taking for approximately three weeks. He reports new onset of leg cramps, night sweats, and a worsening cough since starting the Cellcept. He also mentions experiencing shortness of breath, but it is unclear if this has changed since the medication switch.  In addition to these symptoms, the patient reports waking up at night with hiccups and a cold feeling in his chest, predominantly at night. He also mentions fatigue, which he attributes to the Cellcept.  The patient also has concerns about his ILD diagnosis, specifically regarding the extent of lung scarring and the severity of his disease. He understands that he has moderate ILD with scarring in both lungs.  Outpatient Encounter Medications as of 07/31/2023  Medication Sig   apixaban (ELIQUIS) 5 MG TABS tablet Take 1 tablet (5 mg total) by mouth 2 (two) times daily.   clobetasol ointment (TEMOVATE) 0.05 % Apply topically 2 (two) times daily.   dicyclomine (BENTYL) 20 MG tablet Take 1 tablet (20 mg total) by mouth every 6 (six) hours as needed for spasms.   ezetimibe (ZETIA) 10 MG tablet Take 10 mg by mouth daily.   fenofibrate micronized (LOFIBRA) 134 MG capsule Take 134 mg by mouth daily before breakfast.   mycophenolate (CELLCEPT) 500 MG tablet Take 500 mg by mouth 2 (two) times daily.   predniSONE (DELTASONE) 5 MG tablet Take 5 mg by mouth daily.   SYNTHROID 50 MCG tablet Take by mouth.   [  DISCONTINUED] azaTHIOprine (IMURAN) 50 MG tablet Take 3 tablets (150 mg total) by mouth daily.   [DISCONTINUED] metroNIDAZOLE (FLAGYL) 500 MG tablet Take 1 tablet (500 mg total) by mouth 4 (four) times daily.   No facility-administered encounter medications on file as of 07/31/2023.    Physical Exam: Blood pressure 112/60, pulse  68, height 5\' 3"  (1.6 m), weight 122 lb 12.8 oz (55.7 kg), SpO2 97 %. Gen:      No acute distress HEENT:  EOMI, sclera anicteric Neck:     No masses; no thyromegaly Lungs:    Clear to auscultation bilaterally; normal respiratory effort CV:         Regular rate and rhythm; no murmurs Abd:      + bowel sounds; soft, non-tender; no palpable masses, no distension Ext:    No edema; adequate peripheral perfusion Skin:      Warm and dry; no rash Neuro: alert and oriented x 3 Psych: normal mood and affect   Data Reviewed: Imaging: Cardiac CT 10/19/2015-visualized lung shows fibrotic changes with groundglass and traction bronchiectasis.  High-resolution CT 05/31/2022-basilar predominant fibrotic disease with groundglass opacities, traction bronchiectasis.  Alternate diagnosis suggestive of fibrotic NSIP  High resolution CT 01/21/2023-organized pattern of interstitial lung disease, improvement in inflammatory changes. I have reviewed the images personally  PFTs: 06/10/2022 FVC 2.17 [79%], FEV1 1.94 [94%], F/F 89, TLC 3.04 [61%], DLCO 16.88 [91%] Moderate restriction  01/28/2023 FVC 2.22 [82%], FEV1 1.85 [91%], F/F83, TLC 3.71 [95%], DLCO 15.12 [81%] Mild restriction  Labs: ILD panel 05/22/2022- ANA 1: 320, positive SSA, NX P2 antibody C-ANCA 05/22/2022- positive  CBC 01/15/2023- WBC 7.8, hemoglobin 11.4, platelets 411 CMP 01/15/2023-alk phos 34, AST 22, ALT 20  Assessment:  Interstitial lung disease Likely related to her diagnosis of Sjogren's and dermatomyositis.  The pattern appears to be alternate suggestive of fibrotic NSIP.  This was noted as early as 2016 on a cardiac CT and appears largely unchanged.  PFTs show moderate restriction  Currently on azathioprine at 150 a day and prednisone 5 mg a day. Prednisone is being managed by Dr. Deanne Coffer, rheumatology  CT this month by my review shows improvement in groundglass changes with some consolidation of scarring.  Her lung volumes are better on  PFTs.    Recently switched from azathioprine to Cellcept due to leukopenia by Dr Deanne Coffer. Currently on Cellcept 500mg  BID with new side effects including leg cramps, night sweats, and increased cough. Discussed potential for dose increase.  -Continue Cellcept 500mg  BID. -Monitor side effects and CBC. Being managed by Rheumatology -Consider dose increase based on side effects and CBC results. -Follow up in 3 months  Referral for Second Opinion Patient desires second opinion regarding ILD management. -Refer to [facility] for second opinion regarding ILD management.     Health maintenance I have advised her to get flu, COVID and RSV vaccination but she has declined these vaccinations.  Plan/Recommendations: Continue CellCept.  Managed by rheumatology Referral to [facility] ILD clinic for second opinion Follow-up in 3 months  Chilton Greathouse MD [facility] 07/31/2023, 8:48 AM  CC: Irven Coe, MD

## 2023-08-15 ENCOUNTER — Other Ambulatory Visit (HOSPITAL_COMMUNITY)
Admission: RE | Admit: 2023-08-15 | Discharge: 2023-08-15 | Disposition: A | Discharge status: Home / Self Care | Payer: PPO | Source: Ambulatory Visit | Attending: Medical Genetics | Admitting: Medical Genetics

## 2023-08-15 DIAGNOSIS — Z006 Encounter for examination for normal comparison and control in clinical research program: Secondary | ICD-10-CM | POA: Insufficient documentation

## 2023-08-26 ENCOUNTER — Encounter: Payer: Self-pay | Admitting: Pulmonary Disease

## 2023-08-27 LAB — HELIX MOLECULAR SCREEN: Genetic Analysis Overall Interpretation: NEGATIVE

## 2023-08-27 LAB — GENECONNECT MOLECULAR SCREEN

## 2023-09-05 ENCOUNTER — Encounter: Payer: Self-pay | Admitting: Cardiology

## 2023-09-06 ENCOUNTER — Other Ambulatory Visit: Payer: Self-pay

## 2023-09-06 MED ORDER — APIXABAN 5 MG PO TABS: 5.0000 mg | ORAL_TABLET | Freq: Two times a day (BID) | ORAL | 0 refills | Status: DC

## 2023-09-06 NOTE — Progress Notes (Signed)
Samples for Eliquis 5 mg placed up front check in for pick up.

## 2023-09-10 DIAGNOSIS — R5383 Other fatigue: Secondary | ICD-10-CM | POA: Diagnosis not present

## 2023-09-10 DIAGNOSIS — M3390 Dermatopolymyositis, unspecified, organ involvement unspecified: Secondary | ICD-10-CM | POA: Diagnosis not present

## 2023-09-10 DIAGNOSIS — H04129 Dry eye syndrome of unspecified lacrimal gland: Secondary | ICD-10-CM | POA: Diagnosis not present

## 2023-09-10 DIAGNOSIS — M1990 Unspecified osteoarthritis, unspecified site: Secondary | ICD-10-CM | POA: Diagnosis not present

## 2023-09-10 DIAGNOSIS — J849 Interstitial pulmonary disease, unspecified: Secondary | ICD-10-CM | POA: Diagnosis not present

## 2023-09-10 DIAGNOSIS — M810 Age-related osteoporosis without current pathological fracture: Secondary | ICD-10-CM | POA: Diagnosis not present

## 2023-09-10 DIAGNOSIS — R748 Abnormal levels of other serum enzymes: Secondary | ICD-10-CM | POA: Diagnosis not present

## 2023-09-10 DIAGNOSIS — Z79899 Other long term (current) drug therapy: Secondary | ICD-10-CM | POA: Diagnosis not present

## 2023-09-10 DIAGNOSIS — R21 Rash and other nonspecific skin eruption: Secondary | ICD-10-CM | POA: Diagnosis not present

## 2023-09-10 DIAGNOSIS — R682 Dry mouth, unspecified: Secondary | ICD-10-CM | POA: Diagnosis not present

## 2023-09-10 DIAGNOSIS — M3500 Sicca syndrome, unspecified: Secondary | ICD-10-CM | POA: Diagnosis not present

## 2023-09-27 ENCOUNTER — Other Ambulatory Visit: Payer: Self-pay

## 2023-09-27 ENCOUNTER — Telehealth: Payer: Self-pay | Admitting: Cardiology

## 2023-09-27 ENCOUNTER — Encounter (HOSPITAL_COMMUNITY): Payer: Self-pay

## 2023-09-27 ENCOUNTER — Emergency Department (HOSPITAL_COMMUNITY)
Admission: EM | Admit: 2023-09-27 | Discharge: 2023-09-27 | Disposition: A | Discharge status: Home / Self Care | Payer: PPO | Attending: Emergency Medicine | Admitting: Emergency Medicine

## 2023-09-27 DIAGNOSIS — E559 Vitamin D deficiency, unspecified: Secondary | ICD-10-CM | POA: Diagnosis not present

## 2023-09-27 DIAGNOSIS — E039 Hypothyroidism, unspecified: Secondary | ICD-10-CM | POA: Insufficient documentation

## 2023-09-27 DIAGNOSIS — E7800 Pure hypercholesterolemia, unspecified: Secondary | ICD-10-CM | POA: Diagnosis not present

## 2023-09-27 DIAGNOSIS — M3390 Dermatopolymyositis, unspecified, organ involvement unspecified: Secondary | ICD-10-CM | POA: Diagnosis not present

## 2023-09-27 DIAGNOSIS — I4891 Unspecified atrial fibrillation: Secondary | ICD-10-CM | POA: Diagnosis not present

## 2023-09-27 DIAGNOSIS — Z7901 Long term (current) use of anticoagulants: Secondary | ICD-10-CM | POA: Insufficient documentation

## 2023-09-27 DIAGNOSIS — Z79899 Other long term (current) drug therapy: Secondary | ICD-10-CM | POA: Diagnosis not present

## 2023-09-27 DIAGNOSIS — R002 Palpitations: Secondary | ICD-10-CM | POA: Diagnosis present

## 2023-09-27 DIAGNOSIS — R300 Dysuria: Secondary | ICD-10-CM | POA: Diagnosis not present

## 2023-09-27 LAB — CBC WITH DIFFERENTIAL/PLATELET
Abs Immature Granulocytes: 0.03 10*3/uL (ref 0.00–0.07)
Basophils Absolute: 0 10*3/uL (ref 0.0–0.1)
Basophils Relative: 1 %
Eosinophils Absolute: 0.1 10*3/uL (ref 0.0–0.5)
Eosinophils Relative: 1 %
HCT: 38.8 % (ref 36.0–46.0)
Hemoglobin: 12.4 g/dL (ref 12.0–15.0)
Immature Granulocytes: 1 %
Lymphocytes Relative: 13 %
Lymphs Abs: 0.7 10*3/uL (ref 0.7–4.0)
MCH: 28.8 pg (ref 26.0–34.0)
MCHC: 32 g/dL (ref 30.0–36.0)
MCV: 90 fL (ref 80.0–100.0)
Monocytes Absolute: 0.6 10*3/uL (ref 0.1–1.0)
Monocytes Relative: 11 %
Neutro Abs: 3.8 10*3/uL (ref 1.7–7.7)
Neutrophils Relative %: 73 %
Platelets: 251 10*3/uL (ref 150–400)
RBC: 4.31 MIL/uL (ref 3.87–5.11)
RDW: 12.8 % (ref 11.5–15.5)
WBC: 5.2 10*3/uL (ref 4.0–10.5)
nRBC: 0 % (ref 0.0–0.2)

## 2023-09-27 LAB — COMPREHENSIVE METABOLIC PANEL
ALT: 10 U/L (ref 0–44)
AST: 18 U/L (ref 15–41)
Albumin: 3.2 g/dL — ABNORMAL LOW (ref 3.5–5.0)
Alkaline Phosphatase: 29 U/L — ABNORMAL LOW (ref 38–126)
Anion gap: 12 (ref 5–15)
BUN: 19 mg/dL (ref 8–23)
CO2: 22 mmol/L (ref 22–32)
Calcium: 9.1 mg/dL (ref 8.9–10.3)
Chloride: 104 mmol/L (ref 98–111)
Creatinine, Ser: 1.07 mg/dL — ABNORMAL HIGH (ref 0.44–1.00)
GFR, Estimated: 54 mL/min — ABNORMAL LOW (ref >60)
Glucose, Bld: 95 mg/dL (ref 70–99)
Potassium: 4 mmol/L (ref 3.5–5.1)
Sodium: 138 mmol/L (ref 135–145)
Total Bilirubin: 0.5 mg/dL (ref <1.2)
Total Protein: 6.5 g/dL (ref 6.5–8.1)

## 2023-09-27 LAB — T4, FREE: Free T4: 0.89 ng/dL (ref 0.61–1.12)

## 2023-09-27 LAB — TSH: TSH: 5.625 u[IU]/mL — ABNORMAL HIGH (ref 0.350–4.500)

## 2023-09-27 LAB — MAGNESIUM: Magnesium: 2.1 mg/dL (ref 1.7–2.4)

## 2023-09-27 MED ORDER — SODIUM CHLORIDE 0.9 % IV BOLUS
1000.0000 mL | Freq: Once | INTRAVENOUS | Status: AC
  Administered 2023-09-27: 1000 mL via INTRAVENOUS

## 2023-09-27 MED ORDER — METOPROLOL SUCCINATE ER 25 MG PO TB24: 25.0000 mg | ORAL_TABLET | Freq: Every evening | ORAL | 0 refills | Status: DC

## 2023-09-27 NOTE — ED Notes (Signed)
ED Provider at bedside. 

## 2023-09-27 NOTE — Discharge Instructions (Signed)
You were seen here today for palpitations.  Your workup showed normal electrolytes, cell counts, and kidney function.  Please use metoprolol as prescribed.  Please take it at once a day at bedtime.  Please follow-up with your PCP and your cardiologist.  Please return to the emergency department for chest pain, shortness of breath, severe vomiting or inability to keep down food, loss of consciousness, or any worsening symptom or concern.

## 2023-09-27 NOTE — ED Provider Notes (Signed)
[place] EMERGENCY DEPARTMENT AT [facility] Provider Note   CSN: [id number] Arrival date & time: 09/27/23  8657     History  Chief Complaint  Patient presents with   Weakness    Susan Bray is a 77 y.o. female with a PMH of A-fib on Eliquis interstitial lung disease, Sjogren's syndrome, dermatomyositis, hypothyroidism who presented to the ED for palpitations.  Patient reports for the past 2 to 3 days, she has had intermittent episodes of shortness of breath, fluttering in her chest, and lightheadedness.  States it feels similar to when she has gone into A-fib in the past.  She denies chest pain, fevers, chills, cough, congestion.  Reports about a week ago she had watery diarrhea for several days with mild abdominal pain that has since resolved.  Denies nausea or vomiting.  Reports she is taking Eliquis for her A-fib but no medications for rate control.  States she has missed her Eliquis for the past few days and restarted taking it this morning.  Denies needing to be cardioverted in the past.   Weakness      Home Medications Prior to Admission medications   Medication Sig Start Date End Date Taking? Authorizing Provider  apixaban (ELIQUIS) 5 MG TABS tablet Take 1 tablet (5 mg total) by mouth 2 (two) times daily. 09/06/23  Yes Rollene Rotunda, MD  ezetimibe (ZETIA) 10 MG tablet Take 10 mg by mouth daily.   Yes [provider]  fenofibrate micronized (LOFIBRA) 134 MG capsule Take 134 mg by mouth daily before breakfast.   Yes [provider]  levothyroxine (SYNTHROID) 75 MCG tablet Take 75 mcg by mouth daily before breakfast.   Yes [provider]  metoprolol succinate (TOPROL-XL) 25 MG 24 hr tablet Take 1 tablet (25 mg total) by mouth at bedtime. 09/27/23 10/27/23 Yes Janyth Pupa, MD  mycophenolate (CELLCEPT) 500 MG tablet Take 500 mg by mouth 2 (two) times daily. 07/03/23  Yes [provider]  predniSONE (DELTASONE) 5 MG tablet Take 10 mg  by mouth daily. 06/18/23  Yes [provider]  SYNTHROID 50 MCG tablet Take 50 mcg by mouth daily before breakfast. 06/04/21  Yes [provider]  dicyclomine (BENTYL) 20 MG tablet Take 1 tablet (20 mg total) by mouth every 6 (six) hours as needed for spasms. Patient not taking: Reported on 09/27/2023 10/25/22   Jenel Lucks, MD      Allergies    Colesevelam and Statins    Review of Systems   Review of Systems  Neurological:  Positive for weakness.    Physical Exam Updated Vital Signs BP (!) 116/52   Pulse (!) 107   Temp (!) 97.5 F (36.4 C) (Oral)   Resp 17   Ht 5\' 3"  (1.6 m)   Wt 54.4 kg   SpO2 100%   BMI 21.26 kg/m  Physical Exam Constitutional:      General: She is not in acute distress.    Appearance: Normal appearance. She is not ill-appearing.  HENT:     Head: Normocephalic and atraumatic.     Nose: Nose normal.     Mouth/Throat:     Mouth: Mucous membranes are dry.     Pharynx: Oropharynx is clear.  Eyes:     Extraocular Movements: Extraocular movements intact.     Pupils: Pupils are equal, round, and reactive to light.  Cardiovascular:     Rate and Rhythm: Tachycardia present. Rhythm irregular.     Heart sounds:  Normal heart sounds. No murmur heard.    No friction rub. No gallop.  Pulmonary:     Breath sounds: Normal breath sounds. No stridor. No wheezing, rhonchi or rales.  Abdominal:     Palpations: Abdomen is soft.     Tenderness: There is no abdominal tenderness. There is no guarding or rebound.  Musculoskeletal:     Cervical back: Normal range of motion and neck supple.     Right lower leg: No edema.     Left lower leg: No edema.  Skin:    General: Skin is warm and dry.     Capillary Refill: Capillary refill takes less than 2 seconds.  Neurological:     General: No focal deficit present.     Mental Status: She is alert.     ED Results / Procedures / Treatments   Labs (all labs ordered are listed, but only abnormal  results are displayed) Labs Reviewed  COMPREHENSIVE METABOLIC PANEL - Abnormal; Notable for the following components:      Result Value   Creatinine, Ser 1.07 (*)    Albumin 3.2 (*)    Alkaline Phosphatase 29 (*)    GFR, Estimated 54 (*)    All other components within normal limits  TSH - Abnormal; Notable for the following components:   TSH 5.625 (*)    All other components within normal limits  MAGNESIUM  T4, FREE  CBC WITH DIFFERENTIAL/PLATELET    EKG EKG Interpretation Date/Time:  Friday September 27 2023 09:26:47 EST Ventricular Rate:  150 PR Interval:    QRS Duration:  88 QT Interval:  290 QTC Calculation: 458 R Axis:   27  Text Interpretation: Atrial fibrillation with rapid ventricular response Confirmed by Gerhard Munch [id number]) on 09/27/2023 9:57:35 AM  Radiology No results found.  Procedures Procedures    Medications Ordered in ED Medications  sodium chloride 0.9 % bolus 1,000 mL (0 mLs Intravenous Stopped 09/27/23 1200)    ED Course/ Medical Decision Making/ A&P                                 Medical Decision Making Amount and/or Complexity of Data Reviewed Labs: ordered.   Vital signs stable, physical exam reassuring.  EKG with atrial fibrillation at a rate of 150.  CBC with no leukocytosis or anemia.  Metabolic panel with no gross metabolic or electrolyte abnormality.  TSH 5.6, free T40.89.  Magnesium 2.1.  Patient was admitted a 1 L IV fluid bolus and rates improved into the 100-110's.  Workup is overall reassuring, I suspect she may have been mildly dehydrated due to the diarrhea that she had been reporting.  Heart rate came down appropriately with fluids.  TSH is mildly elevated, free T4 is within normal limits, so I do not feel this is driving her symptoms.  I discussed the patient with cardiology as I feel the patient is safe for outpatient management.  I discussed initiation of medication with the cardiologist on-call, who recommended starting  metoprolol succinate 25 mg nightly.  This was discussed with the patient by Dr. Jeraldine Loots, patient reports she feels comfortable taking the medication and would call her cardiologist for follow-up.  Strict return precautions were discussed, patient voiced understanding.  She was discharged in stable condition.        Final Clinical Impression(s) / ED Diagnoses Final diagnoses:  Atrial fibrillation, unspecified type (HCC)    Rx / DC  Orders ED Discharge Orders          Ordered    metoprolol succinate (TOPROL-XL) 25 MG 24 hr tablet  Nightly        09/27/23 1300              Janyth Pupa, MD 09/27/23 1405    Gerhard Munch, MD 09/27/23 1528

## 2023-09-27 NOTE — Telephone Encounter (Signed)
Pt reports shortness of breath, A-fib- hr=135, dizziness, oxygen saturation dropping below 88.  Advised the patient to go to the ER. She has someone who can drive her to the ER, instructed her not to drive herself due to her dizziness. She verbalized understanding.

## 2023-09-27 NOTE — ED Triage Notes (Signed)
Pt c/o weakness, dizziness, and abdominal pain x 1 week. Pt states she could feel her heart fluttering. Pt denies pain at this time.

## 2023-09-27 NOTE — Telephone Encounter (Signed)
STAT if HR is under 50 or over 120 (normal HR is 60-100 beats per minute)  What is your heart rate? 135  Do you have a log of your heart rate readings (document readings)? No  Do you have any other symptoms? SOB and really weak and oxygen level keeps dropping below 88.

## 2023-09-29 NOTE — Progress Notes (Unsigned)
Cardiology Office Note:   Date:  09/29/2023  ID:  Susan Bray, DOB 09/25/1946, MRN [id number] PCP: Irven Coe, MD  [facility] Providers Cardiologist:  Rollene Rotunda, MD {  History of Present Illness:   Susan Bray is a 77 y.o. female  who presents for evaluation of dizziness, lightheadedness, arm discomfort.   I saw her in 2017.    She was found to have non obstructive and branch vessel disease and was managed medically. In November 2023 she had an echo with no significant abnormalities.  In July she had a perfusion study that was low risk.  She wore a monitor in November that demonstrated no atrial fibrillation.  I saw her in Sept she had dizziness.  ***   ***   She is coming in now because she is getting dizzy spells.  She feels her heart rate go down and I see the 60s in the 50s.  She is concerned because her diastolic goes in the 40s and 30s.  Systolics often in the 100-90 range.  She has not had any frank syncope.  She has not had any new palpitations.  She denies any chest pressure, neck or arm discomfort.  She has had no weight gain or edema.         I saw her in June and she had some increased shortness of breath.  She has not had cardiac evaluation since that time.  Perfusion imaging demonstrated no evidence of ischemia and was thought to be low risk.  I saw her recently at pulmonary rehab.  She was noted to have atrial fib.         ROS: ***  Studies Reviewed:    EKG:       ***  Risk Assessment/Calculations:   {Does this patient have ATRIAL FIBRILLATION?:[phone]} No BP recorded.  {Refresh Note OR Click here to enter BP  :1}***        Physical Exam:   VS:  There were no vitals taken for this visit.   Wt Readings from Last 3 Encounters:  09/27/23 120 lb (54.4 kg)  07/31/23 122 lb (55.3 kg)  06/29/23 118 lb 11.2 oz (53.8 kg)     GEN: Well nourished, well developed in no acute distress NECK: No JVD; No carotid bruits CARDIAC: ***RR, *** murmurs,  rubs, gallops RESPIRATORY:  Clear to auscultation without rales, wheezing or rhonchi  ABDOMEN: Soft, non-tender, non-distended EXTREMITIES:  No edema; No deformity   ASSESSMENT AND PLAN:   PAF: The patient is having paroxysmal atrial fibrillation.   CHA2DS2-VASc score is 3.  ***  Continue anticoagulation.  I had started Cardizem at the last visit because she has been in rapid atrial fibrillation pulmonary rehab.  I am going to have to stop this as her blood pressure not tolerating it.  She has not had any further tachypalpitations and nothing on the monitor.  If she gets recurrent atrial fibrillation and we might need to use a lower dose beta-blocker or immediate release calcium channel blocker.   CAD:   ***  The patient has no new sypmtoms.  No further cardiovascular testing is indicated.  We will continue with aggressive risk reduction and meds as listed.   DYSLIPIDEMIA:   LDL was ***  69.  No change in therapy.    HTN: Her blood pressure is ***  low and I will make the change above.              Follow up ***  Signed, Rollene Rotunda, MD

## 2023-09-30 ENCOUNTER — Encounter: Payer: Self-pay | Admitting: Cardiology

## 2023-09-30 ENCOUNTER — Encounter: Payer: Self-pay | Admitting: Pulmonary Disease

## 2023-09-30 DIAGNOSIS — J849 Interstitial pulmonary disease, unspecified: Secondary | ICD-10-CM | POA: Diagnosis not present

## 2023-09-30 DIAGNOSIS — I480 Paroxysmal atrial fibrillation: Secondary | ICD-10-CM | POA: Diagnosis not present

## 2023-09-30 DIAGNOSIS — I700 Atherosclerosis of aorta: Secondary | ICD-10-CM | POA: Diagnosis not present

## 2023-09-30 DIAGNOSIS — M810 Age-related osteoporosis without current pathological fracture: Secondary | ICD-10-CM | POA: Diagnosis not present

## 2023-09-30 DIAGNOSIS — M3500 Sicca syndrome, unspecified: Secondary | ICD-10-CM | POA: Diagnosis not present

## 2023-09-30 DIAGNOSIS — D72819 Decreased white blood cell count, unspecified: Secondary | ICD-10-CM | POA: Diagnosis not present

## 2023-09-30 DIAGNOSIS — E559 Vitamin D deficiency, unspecified: Secondary | ICD-10-CM | POA: Diagnosis not present

## 2023-09-30 DIAGNOSIS — Z8249 Family history of ischemic heart disease and other diseases of the circulatory system: Secondary | ICD-10-CM | POA: Diagnosis not present

## 2023-09-30 DIAGNOSIS — E063 Autoimmune thyroiditis: Secondary | ICD-10-CM | POA: Diagnosis not present

## 2023-09-30 DIAGNOSIS — E039 Hypothyroidism, unspecified: Secondary | ICD-10-CM | POA: Diagnosis not present

## 2023-09-30 DIAGNOSIS — E7800 Pure hypercholesterolemia, unspecified: Secondary | ICD-10-CM | POA: Diagnosis not present

## 2023-09-30 DIAGNOSIS — E538 Deficiency of other specified B group vitamins: Secondary | ICD-10-CM | POA: Diagnosis not present

## 2023-09-30 MED ORDER — METOPROLOL SUCCINATE ER 25 MG PO TB24: 12.5000 mg | ORAL_TABLET | Freq: Every evening | ORAL | Status: DC

## 2023-10-02 ENCOUNTER — Ambulatory Visit: Payer: PPO | Attending: Cardiology | Admitting: Cardiology

## 2023-10-02 ENCOUNTER — Encounter: Payer: Self-pay | Admitting: Cardiology

## 2023-10-02 VITALS — BP 120/64 | HR 51 | Ht 63.0 in | Wt 126.6 lb

## 2023-10-02 DIAGNOSIS — I480 Paroxysmal atrial fibrillation: Secondary | ICD-10-CM | POA: Diagnosis not present

## 2023-10-02 DIAGNOSIS — I10 Essential (primary) hypertension: Secondary | ICD-10-CM

## 2023-10-02 DIAGNOSIS — I2510 Atherosclerotic heart disease of native coronary artery without angina pectoris: Secondary | ICD-10-CM

## 2023-10-02 DIAGNOSIS — E785 Hyperlipidemia, unspecified: Secondary | ICD-10-CM

## 2023-10-02 MED ORDER — METOPROLOL SUCCINATE ER 25 MG PO TB24: 12.5000 mg | ORAL_TABLET | Freq: Every evening | ORAL | 3 refills | Status: DC

## 2023-10-02 MED ORDER — ROSUVASTATIN CALCIUM 10 MG PO TABS: 10.0000 mg | ORAL_TABLET | Freq: Every day | ORAL | 3 refills | Status: DC

## 2023-10-02 MED ORDER — APIXABAN 5 MG PO TABS: 5.0000 mg | ORAL_TABLET | Freq: Two times a day (BID) | ORAL | 0 refills | Status: DC

## 2023-10-02 NOTE — Patient Instructions (Signed)
Medication Instructions:  Rosuvastatin added to med list. Script sent along with metoprolol refills.  Samples of Eliquis given in office.  *If you need a refill on your cardiac medications before your next appointment, please call your pharmacy*   Follow-Up: At [facility], you and your health needs are our priority.  As part of our continuing mission to provide you with exceptional heart care, we have created designated Provider Care Teams.  These Care Teams include your primary Cardiologist (physician) and Advanced Practice Providers (APPs -  Physician Assistants and Nurse Practitioners) who all work together to provide you with the care you need, when you need it.  We recommend signing up for the patient portal called "MyChart".  Sign up information is provided on this After Visit Summary.  MyChart is used to connect with patients for Virtual Visits (Telemedicine).  Patients are able to view lab/test results, encounter notes, upcoming appointments, etc.  Non-urgent messages can be sent to your provider as well.   To learn more about what you can do with MyChart, go to [email or web address].    Your next appointment:   3 month(s)  Provider:   Marjie Skiff, PA-C

## 2023-10-05 NOTE — Telephone Encounter (Signed)
Dr. Isaiah Serge, please see mychart sent by pt and advise.

## 2023-10-07 NOTE — Telephone Encounter (Signed)
Yes. Please reschedule appointment in February after her visit visit to [facility]

## 2023-10-08 NOTE — Telephone Encounter (Signed)
Scheduled for 2/20 after [facility]

## 2023-10-09 DIAGNOSIS — H532 Diplopia: Secondary | ICD-10-CM | POA: Diagnosis not present

## 2023-10-09 DIAGNOSIS — H43812 Vitreous degeneration, left eye: Secondary | ICD-10-CM | POA: Diagnosis not present

## 2023-10-09 DIAGNOSIS — H5000 Unspecified esotropia: Secondary | ICD-10-CM | POA: Diagnosis not present

## 2023-10-09 DIAGNOSIS — Z961 Presence of intraocular lens: Secondary | ICD-10-CM | POA: Diagnosis not present

## 2023-10-28 ENCOUNTER — Encounter: Payer: Self-pay | Admitting: Cardiology

## 2023-10-30 NOTE — Telephone Encounter (Signed)
 Pt informed of providers result & recommendations. Pt verbalized understanding. All questions, if any, were answered.  Pt is asking if when her HR is >100 ok to take extra Metoprolol Succinate?

## 2023-11-04 ENCOUNTER — Ambulatory Visit: Payer: PPO | Admitting: Pulmonary Disease

## 2023-12-12 ENCOUNTER — Ambulatory Visit: Payer: PPO | Admitting: Pulmonary Disease

## 2023-12-13 ENCOUNTER — Encounter: Payer: Self-pay | Admitting: Cardiology

## 2023-12-23 MED ORDER — METOPROLOL SUCCINATE ER 25 MG PO TB24: 12.5000 mg | ORAL_TABLET | ORAL | 3 refills | Status: DC

## 2023-12-28 NOTE — Progress Notes (Signed)
 Cardiology Office Note:    Date:  01/09/2024   ID:  Susan Bray, DOB 1946-06-17, MRN [id number]  PCP:  Irven Coe, MD  Cardiologist:  Rollene Rotunda, MD {  Referring MD: Irven Coe, MD   Chief Complaint: follow-up of atrial fibrillation and CAD  History of Present Illness:    Susan Bray is a 78 y.o. female with a history of moderate CAD on cardiac catheterization in 2017 (treated medically), paroxysmal atrial fibrillation on Eliquis, hypertension, hyperlipidemia, hypothyroidism, and IBS who is followed by Dr. Antoine Poche and present today for routine follow-up.  Patient is primarily followed by Cardiology for CAD and paroxysmal atrial fibrillation. She has had multiple stress tests in the past. LHC in 11/2015 showed 40-60% stenosis of proximal LAD, 80% stenosis of a small to moderate sized 1st Diag, and 40% stenosis of RCA. Medical therapy was recommended at that time. Last ischemic evaluation was a Myoview in 04/2022 which was low risk and showed no evidence of ischemia. Echo in 08/2022 showed LVEF of 60-65% with normal wall motion and diastolic parameters, normal RV size and function, and no significant valvular disease. Monitor in 08/2022 showed rare runs of SVT (longest run lasting 13 beats) but no sustained arrhythmias.   She was seen in the ED in early 09/2023 for palpitations and lightheadedness. She was noted to be in rapid atrial fibrillation with rates in the 150s. This was felt to be driven by dehydration. She was treated with fluids and heart rates improved. TSH was noted to be mildly elevated but free T4 was normal. She was started on Toprol-XL and felt to be stable for discharge. She was seen by Dr. Antoine Poche later that month at which time she was doing better. EKG at that visit showed sinus bradycardia with rate of 51 bpm.  Patient presents today for follow-up. She continues to have occasional episodes of palpitations with her atrial fibrillation which can sometimes last for 1 day  but she states these are much better than before. The Toprol-XL has helped. She describes occasional very atypical chest pain that occurs randomly and feels like a twinge. This last for 1-2 seconds at a time and then resolves. However, she denies any chest pain/ discomfort that sounds like angina. She has chronic dyspnea on exertion if she overdoes it but this is not new and is stable. No dyspnea at rest. No orthopnea, PND, or edema. She also has chronic dizziness/ lightheadedness. She states she has had this for years and it is stable. She has discussed this with multiple physicians and states no one has been able to give her an answer. Dizziness not always correlated with palpitations and does not occurs when standing quickly. It normally occurs with quick turns of her body/ head and is worse when looking up. This sounds more like vertigo which she does have a history of. However, she states she has seen ENT before and they did not say much. No near syncope/ syncope.   Her main concern today is elevated BP. She states her BP seems to be slowly increasing at home and is typically higher first thing in the morning and after taking a nap. She brought in a log of her home BP/HR. She does have occasional systolic BP reading in the 140s to 160s. However, it looks like her systolic BP is mostly in the 110s to 130s. She brought in her home wrist cuff today and it runs a little higher than our manual read. Heart rates are  typically in the 50s to 60s.   EKGs/Labs/Other Studies Reviewed:    The following studies were reviewed:  Left Cardiac Catheterization 12/06/2015: Ost 1st Diag to 1st Diag lesion, 80% stenosed. Prox RCA lesion, 40% stenosed. Prox LAD lesion, 60% stenosed. The lesion was not previously treated.   Heavily calcified left coronary including LAD and circumflex. Eccentric proximal 40-60% (depending upon the view) LAD. 80% small-to-moderate sized diagonal. Eccentric 40% RCA. Normal left  ventricular function with ejection fraction 65%.   Recommendations: Risk factor modification to slow/avoid progression of moderate disease noted. _______________  Myoview 04/25/2022:   The study is normal. The study is low risk.   No ST deviation was noted.   LV perfusion is normal.   Left ventricular function is normal. Nuclear stress EF: 71 %. The left ventricular ejection fraction is hyperdynamic (>65%). End diastolic cavity size is normal.   Prior study available for comparison from 12/25/2012. _______________  Echocardiogram 09/10/2022: Impressions:  1. Left ventricular ejection fraction, by estimation, is 60 to 65%. The  left ventricle has normal function. The left ventricle has no regional  wall motion abnormalities. Left ventricular diastolic parameters were  normal.   2. Right ventricular systolic function is normal. The right ventricular  size is normal. There is normal pulmonary artery systolic pressure.   3. The mitral valve is normal in structure. Trivial mitral valve  regurgitation. No evidence of mitral stenosis.   4. The aortic valve is normal in structure. Aortic valve regurgitation is  not visualized. No aortic stenosis is present.   5. The inferior vena cava is normal in size with greater than 50%  respiratory variability, suggesting right atrial pressure of 3 mmHg.  _______________  Monitor 08/2022: Predominant rhythm was normal sinus Rare runs of supraventricular tachycardia with the longest run being 13 beats No sustained arrhythmias  EKG:  EKG not ordered today.   Recent Labs: 09/27/2023: ALT 10; BUN 19; Creatinine, Ser 1.07; Hemoglobin 12.4; Magnesium 2.1; Platelets 251; Potassium 4.0; Sodium 138; TSH 5.625  Recent Lipid Panel    Component Value Date/Time   CHOL 149 11/15/2015 0901   TRIG 117 11/15/2015 0901   HDL 47 11/15/2015 0901   CHOLHDL 3.2 11/15/2015 0901   VLDL 23 11/15/2015 0901   LDLCALC 79 11/15/2015 0901    Physical Exam:    Vital  Signs: BP (!) 160/76   Pulse (!) 56   Ht 5\' 3"  (1.6 m)   Wt 130 lb 6.4 oz (59.1 kg)   SpO2 99%   BMI 23.10 kg/m     Wt Readings from Last 3 Encounters:  01/09/24 130 lb 6.4 oz (59.1 kg)  10/02/23 126 lb 9.6 oz (57.4 kg)  09/27/23 120 lb (54.4 kg)     General: 78 y.o. Caucasian female in no acute distress. HEENT: Normocephalic and atraumatic. Sclera clear.  Neck: Supple. No carotid bruits. No JVD. Heart: Borderline bradycardia with normal rhythm. Possible soft systolic murmur. No gallops or rubs.N Lungs: No increased work of breathing. Clear to ausculation bilaterally. No wheezes, rhonchi, or rales.  Extremities: No lower extremity edema.   Skin: Warm and dry. Neuro: No focal deficits. Psych: Normal affect. Responds appropriately.  Assessment:    1. Coronary artery disease involving native coronary artery of native heart without angina pectoris   2. Paroxysmal atrial fibrillation (HCC)   3. Dizziness   4. Primary hypertension   5. Hyperlipidemia, unspecified hyperlipidemia type     Plan:    CAD Patient has had multiple  ischemic evaluations in the past. LHC in 2017 showed 40-60% stenosis of proximal LAD, 80% stenosis of a small to moderate sized 1st Diag, and 40% stenosis of RCA. Medical therapy was recommended at that time. Last ischemic evaluation was a Myoview in 04/2022 which was low risk and showed no evidence of ischemia.  - She describes very atypical chest pain that she describes as twinges that last for 1-2 seconds at a time but nothing that sounds like angina. - No on aspirin given need for full anticoagulation. - Symptoms do not sound cardiac in nature. No further ischemic evaluation necessary at this time.  Paroxysmal Atrial Fibrillation Maintaining sinus rhythm on exam. Rate in the mid 50s. - She continues to have occasional episodes of palpitations but much improved on beta-blocker.  - Continue Toprol-XL 12.5mg  twice daily. OK to continue to take an extra 12.5mg   as needed for worsening palpitations.  - Continue chronic anticoagulation with Eliquis 5mg  twice daily.   Dizziness Patient has a history of chronic dizziness that seems to occur with quick movements such as turning her body/ head or looking up. Does not seem to be related to when she stands quickly. Not related to palpitations.  - Dizziness does not sound cardiac to me. Sounds more like inner ear/ vestibular issues. However, it does not look like she has every had carotid dopplers. Do not appreciate any bruits on exam but will order carotid dopplers.  Hypertension BP elevated in the office today. Initially 146/60 (home wrist cuff read 165/86 and 167/92) and then 160/76 on my personal recheck (home wrist cuff read as 177/83 and 169/83) at the end of the visit. Reviewed home BP - she does have occasional systolic BP reading in the 140s to 160s. However, it looks like systolic BP mostly in the 110s to 130s at home. - Continue Toprol-XL 12.5mg  twice daily.  - Hesitant to adjust her medication to months given her chronic dizziness and discrepancy in home BP cuff and manual cuff in the office. However, did discuss potential options in the future including switching from Toprol-XL to Coreg or adding low dose Losartan. Patient would like to continue with current medications for now and continue to monitor her BP at home.  Discussed checking BP about 2 hours after taking Toprol-XL. Asked her to notify us if consistently >130/80. Also discussed getting a new BP cuff at home as her seems to be running higher than what we manually got in the office.   Hyperlipidemia Lipid panel in 10/2023 per [facility]: Total Cholesterol 183, Triglycerides 92, HDL 82, LDL 84. LDL goal <70 given CAD. - Currently on Crestor 10mg  daily. Will increase to 20mg  daily.  - She also had Zetia 10mg  daily and [place] 134mg  mg daily listed on her medication list but she states she is no longer taking this. Will remove from list.  - Will need  repeat lipid panel and LFTs in 6-8 weeks.   Disposition: Follow up in 3 months.    Leanne Lovely, PA-C  01/09/2024 12:42 PM    [facility]

## 2024-01-09 ENCOUNTER — Ambulatory Visit: Payer: PPO | Attending: Student | Admitting: Student

## 2024-01-09 ENCOUNTER — Encounter: Payer: Self-pay | Admitting: Student

## 2024-01-09 VITALS — BP 160/76 | HR 56 | Ht 63.0 in | Wt 130.4 lb

## 2024-01-09 DIAGNOSIS — I480 Paroxysmal atrial fibrillation: Secondary | ICD-10-CM

## 2024-01-09 DIAGNOSIS — I2510 Atherosclerotic heart disease of native coronary artery without angina pectoris: Secondary | ICD-10-CM

## 2024-01-09 DIAGNOSIS — R42 Dizziness and giddiness: Secondary | ICD-10-CM | POA: Diagnosis not present

## 2024-01-09 DIAGNOSIS — I10 Essential (primary) hypertension: Secondary | ICD-10-CM

## 2024-01-09 DIAGNOSIS — E785 Hyperlipidemia, unspecified: Secondary | ICD-10-CM

## 2024-01-09 MED ORDER — ROSUVASTATIN CALCIUM 20 MG PO TABS: 20.0000 mg | ORAL_TABLET | Freq: Every day | ORAL | 6 refills | Status: DC

## 2024-01-09 MED ORDER — METOPROLOL SUCCINATE ER 25 MG PO TB24: 12.5000 mg | ORAL_TABLET | Freq: Two times a day (BID) | ORAL | Status: AC

## 2024-01-09 NOTE — Patient Instructions (Addendum)
 Medication Instructions:  INCREASE ROSUVASTATIN 20MG  DAILY *If you need a refill on your cardiac medications before your next appointment, please call your pharmacy*  Lab Work: FASTING LIPID AND LFT IN 6-8 WEEKS If you have labs (blood work) drawn today and your tests are completely normal, you will receive your results only by:  MyChart Message (if you have MyChart) OR  A paper copy in the mail If you have any lab test that is abnormal or we need to change your treatment, we will call you to review the results.  Testing/Procedures: Your physician has requested that you have a carotid duplex. This test is an ultrasound of the carotid arteries in your neck. It looks at blood flow through these arteries that supply the brain with blood. Allow one hour for this exam. There are no restrictions or special instructions.   Follow-Up: At [facility], you and your health needs are our priority.  As part of our continuing mission to provide you with exceptional heart care, we have created designated Provider Care Teams.  These Care Teams include your primary Cardiologist (physician) and Advanced Practice Providers (APPs -  Physician Assistants and Nurse Practitioners) who all work together to provide you with the care you need, when you need it.  Your next appointment:   3 month(s)  Provider:   Marjie Skiff, PA-C   Other Instructions TAKE YOUR BLOOD PRESSURE 2-3 HOURS AFTER TAKING YOUR MEDICATION IF CONSISTENTLY >130/80 PLEASE LET us KNOW.

## 2024-01-23 ENCOUNTER — Ambulatory Visit (HOSPITAL_COMMUNITY)
Admission: RE | Admit: 2024-01-23 | Discharge: 2024-01-23 | Disposition: A | Discharge status: Home / Self Care | Source: Ambulatory Visit | Attending: Internal Medicine | Admitting: Internal Medicine

## 2024-01-23 DIAGNOSIS — R42 Dizziness and giddiness: Secondary | ICD-10-CM

## 2024-02-13 ENCOUNTER — Ambulatory Visit: Payer: PPO | Admitting: Pulmonary Disease

## 2024-02-13 ENCOUNTER — Encounter: Payer: Self-pay | Admitting: Pulmonary Disease

## 2024-02-13 VITALS — BP 102/70 | HR 105 | Temp 97.6°F | Ht 63.0 in | Wt 132.2 lb

## 2024-02-13 DIAGNOSIS — Z5181 Encounter for therapeutic drug level monitoring: Secondary | ICD-10-CM | POA: Diagnosis not present

## 2024-02-13 DIAGNOSIS — I4891 Unspecified atrial fibrillation: Secondary | ICD-10-CM

## 2024-02-13 DIAGNOSIS — M818 Other osteoporosis without current pathological fracture: Secondary | ICD-10-CM | POA: Diagnosis not present

## 2024-02-13 DIAGNOSIS — J849 Interstitial pulmonary disease, unspecified: Secondary | ICD-10-CM

## 2024-02-13 NOTE — Patient Instructions (Addendum)
 VISIT SUMMARY:  Today, we discussed your interstitial lung disease related to Sjogren's syndrome, your atrial fibrillation, and the side effects of long-term prednisone use. You reported improvement in your cough but continue to experience fatigue, shortness of breath, and chest tightness. We reviewed your current medications and discussed potential adjustments to better manage your symptoms and reduce side effects.  YOUR PLAN:  -INTERSTITIAL LUNG DISEASE RELATED TO SJOGREN'S SYNDROME: Interstitial lung disease is a condition where the lung tissue becomes scarred and stiff, making it difficult to breathe. Your condition is currently well-managed with no cough or wheezing, but you still experience fatigue, shortness of breath, and chest tightness. We will order a chest CT in six months to monitor the progression of your lung disease. Additionally, we will discuss with your rheumatologist the possibility of increasing your CellCept dosage to taper off prednisone due to its side effects.  -ATRIAL FIBRILLATION: Atrial fibrillation is an irregular and often rapid heart rate that can lead to poor blood flow. You have reported episodes of increased heart rate and oxygen desaturation during activities and car rides. We performed a walk test to monitor your oxygen levels during activity and the level stayed normal.  -OSTEOPOROSIS DUE TO LONG-TERM USE OF PREDNISONE: Osteoporosis is a condition where bones become weak and brittle. Long-term use of prednisone increases your risk of developing osteoporosis. We will discuss with your rheumatologist the possibility of increasing your CellCept dosage to taper off prednisone to reduce this risk.  INSTRUCTIONS:  Please schedule a chest CT in six months to monitor your lung disease. Additionally, we will perform a walk test to monitor your oxygen levels during activity. Discuss with your rheumatologist the possibility of increasing your CellCept dosage to taper off  prednisone. Continue to monitor your symptoms and report any significant changes.

## 2024-02-13 NOTE — Progress Notes (Signed)
 Susan Bray    [id number]    1946/01/29  Primary Care Physician:Hammer, Kelle Pate, MD  Referring Physician: Benedetto Brady, MD [address] [place],  [place] [address]  Chief complaint: Follow-up for interstitial lung disease  HPI: 78 y.o. who  has a past medical history of Atrial fibrillation (HCC), CAD (coronary artery disease), Cataract, Diverticulitis, Hyperlipidemia, IBS (irritable bowel syndrome), Thyroid  disease, and VIN III (vulvar intraepithelial neoplasia III).   She has Sjogren's syndrome and dermatomyositis with initial complaints of skin thickening, joint stiffness, dyspnea and cough.  Diagnosis made in June 2023 by Dr. Bascom Lily.  Initially tried on prednisone and methotrexate.  Methotrexate was stopped for unclear reason.    Imaging shows interstitial lung disease and she has been referred here for further evaluation She was started on azathioprine  50 mg/day in July 2023 and this was increased to 100 mg/day in August 2023 and to 150 mg/day on October 2023. Azathioprine  has been denied by insurance and spite of multiple appeals.  She is paying out-of-pocket with good Rx card, costs $30 a month.  Her symptoms have improved with the treatment Continues on prednisone per Dr. Donita Furrow pulmonary rehab She had an episode of paroxysmal atrial fibrillation in November 2023 while in pulmonary rehab.  Has followed up with Dr. Lavonne Prairie from cardiology.  Started on Eliquis  and Cardizem .  Pets: No pets Occupation: Retired Airline pilot Exposures: No mold, hot tub, Jacuzzi.  No feather pillows or comforters Smoking history: Never smoker Travel history: Significant travel history Relevant family history: No family history of lung disease  Interim history: Discussed the use of AI scribe software for clinical note transcription with the patient, who gave verbal consent to proceed.  History of Present Illness Susan Bray is a 78 year old female with interstitial lung  disease related to Sjogren's syndrome who presents for follow-up of her lung condition.  She has experienced significant improvement in her cough, which has resolved, but continues to have fatigue, shortness of breath, and chest tightness. She is uncertain if the chest tightness is due to her interstitial lung disease or her atrial fibrillation.  Had a second opinion visit at [facility]D clinic at [facility] and saw Dr. Lydia Sams and rheumatology who agreed with the diagnosis.  Repeat myositis panel showed persistent elevation of NXP 2 antibody She was prescribed Dulera for her cough, but due to insurance issues, she used Breo instead. She has since discontinued the inhaler as her cough resolved. Her current medications include Cellcept 500 mg twice daily and prednisone 5 mg daily. She feels she is on prednisone 'all the time'.  She has noticed weight gain, which she attributes to her medication, particularly prednisone. Her weight increased from 117 pounds at diagnosis to 132 pounds. She has started eating less to manage her weight.  She experiences episodes of oxygen desaturation, particularly during car rides or physical activity, with levels dropping to as low as 85. Her oxygen levels return to normal with deep breathing. She uses a monitor that vibrates when her oxygen levels fall below 88.  She has a history of atrial fibrillation and notes that her heart rate increases and her oxygen levels drop during these episodes. An echocardiogram in 2023 was normal.    Outpatient Encounter Medications as of 02/13/2024  Medication Sig   apixaban  (ELIQUIS ) 5 MG TABS tablet Take 1 tablet (5 mg total) by mouth 2 (two) times daily.   dicyclomine  (BENTYL ) 20 MG  tablet Take 1 tablet (20 mg total) by mouth every 6 (six) hours as needed for spasms.   levothyroxine (SYNTHROID) 75 MCG tablet Take 75 mcg by mouth daily before breakfast.   metoprolol  succinate (TOPROL -XL) 25 MG 24 hr tablet Take 0.5 tablets (12.5 mg total) by mouth 2  (two) times daily. Take your metoprolol  12.5 mg twice daily and take an extra 1/2 tablet (12.5mg ) FOR WORSENING PALPITATIONS.   mycophenolate (CELLCEPT) 500 MG tablet Take 500 mg by mouth 2 (two) times daily.   predniSONE (DELTASONE) 5 MG tablet Take 10 mg by mouth daily.   rosuvastatin  (CRESTOR ) 20 MG tablet Take 1 tablet (20 mg total) by mouth daily.   SYNTHROID 50 MCG tablet Take 50 mcg by mouth daily before breakfast.   fluticasone furoate-vilanterol (BREO ELLIPTA) 100-25 MCG/ACT AEPB Inhale 1 puff into the lungs. (Patient not taking: Reported on 02/13/2024)   No facility-administered encounter medications on file as of 02/13/2024.    Physical Exam: Blood pressure 102/70, pulse (!) 105, temperature 97.6 F (36.4 C), temperature source Temporal, height 5\' 3"  (1.6 m), weight 132 lb 3.2 oz (60 kg), SpO2 100%. Gen:      No acute distress HEENT:  EOMI, sclera anicteric Neck:     No masses; no thyromegaly Lungs:    Clear to auscultation bilaterally; normal respiratory effort CV:         Regular rate and rhythm; no murmurs Abd:      + bowel sounds; soft, non-tender; no palpable masses, no distension Ext:    No edema; adequate peripheral perfusion Skin:      Warm and dry; no rash Neuro: alert and oriented x 3 Psych: normal mood and affect   Data Reviewed: Imaging: Cardiac CT 10/19/2015-visualized lung shows fibrotic changes with groundglass and traction bronchiectasis.  High-resolution CT 05/31/2022-basilar predominant fibrotic disease with groundglass opacities, traction bronchiectasis.  Alternate diagnosis suggestive of fibrotic NSIP  High resolution CT 01/21/2023-organized pattern of interstitial lung disease, improvement in inflammatory changes. I have reviewed the images personally  PFTs: 06/10/2022 FVC 2.17 [79%], FEV1 1.94 [94%], F/F 89, TLC 3.04 [61%], DLCO 16.88 [91%] Moderate restriction  01/28/2023 FVC 2.22 [82%], FEV1 1.85 [91%], F/F83, TLC 3.71 [75%], DLCO 15.12 [81%] Mild  restriction  11/07/2022 at [facility] FVC 2.40 [97%], FEV1 2.05 [107%], F/F 86, TLC 4.42 [95%] DLCO 14.3 [80%]  Labs: ILD panel 05/22/2022- ANA 1: 320, positive SSA, NX P2 antibody C-ANCA 05/22/2022- positive  CBC 01/15/2023- WBC 7.8, hemoglobin 11.4, platelets 411 CMP 01/15/2023-alk phos 34, AST 22, ALT 20 Assessment & Plan Interstitial lung disease related to Sjogren's syndrome, possible dermatomyositis The interstitial lung disease is well-managed with no cough or wheezing. She experiences fatigue, dyspnea, and chest tightness, potentially related to both the lung disease and atrial fibrillation. She is on CellCept (500 mg twice daily) and prednisone (5 mg daily). There is a plan to discuss with the rheumatologist increasing CellCept to taper off prednisone due to its side effects, including osteoporosis.  Recent PFTs at [facility] in January 2024 were reviewed and they actually show continued improvement in lung volumes.  - Order chest CT in six months to monitor lung disease progression. - Discuss with Dr. Bascom Lily, rheumatologist the possibility of increasing CellCept to taper off prednisone.  Atrial fibrillation She reports episodes of tachycardia and oxygen desaturation during activities and while riding in a car, consistent with atrial fibrillation. She is under cardiology care and had a normal echocardiogram in 2023. Oxygen levels drop to mid-80s during activity  but normalize with deep breathing. - Perform a walk test to monitor oxygen levels during activity.  Osteoporosis due to long-term use of prednisone Long-term prednisone use increases her risk of osteoporosis. The plan is to discuss with the rheumatologist the possibility of tapering off prednisone by increasing CellCept, as prednisone has significant side effects, including bone weakening. - Discuss with rheumatologist the possibility of increasing CellCept to taper off prednisone.   Health maintenance I have advised her to get flu, COVID and  RSV vaccination but she has declined these vaccinations.  Plan/Recommendations: Continue CellCept, prednisone. Managed by rheumatology High-res CT in 6 months  Phyllis Breeze MD [facility] 02/13/2024, 9:05 AM  CC: Benedetto Brady, MD

## 2024-02-25 ENCOUNTER — Encounter: Payer: Self-pay | Admitting: Pulmonary Disease

## 2024-02-25 NOTE — Telephone Encounter (Signed)
**Note De-identified  [place] Obfuscation** Please advise 

## 2024-03-03 ENCOUNTER — Ambulatory Visit: Payer: Self-pay | Admitting: Student

## 2024-03-03 DIAGNOSIS — E785 Hyperlipidemia, unspecified: Secondary | ICD-10-CM

## 2024-03-03 DIAGNOSIS — I2510 Atherosclerotic heart disease of native coronary artery without angina pectoris: Secondary | ICD-10-CM

## 2024-03-03 LAB — HEPATIC FUNCTION PANEL
ALT: 11 IU/L (ref 0–32)
AST: 16 IU/L (ref 0–40)
Albumin: 4.1 g/dL (ref 3.8–4.8)
Alkaline Phosphatase: 49 IU/L (ref 44–121)
Bilirubin Total: 0.5 mg/dL (ref 0.0–1.2)
Bilirubin, Direct: 0.19 mg/dL (ref 0.00–0.40)
Total Protein: 6.3 g/dL (ref 6.0–8.5)

## 2024-03-03 LAB — LIPID PANEL
Chol/HDL Ratio: 2.6 ratio (ref 0.0–4.4)
Cholesterol, Total: 195 mg/dL (ref 100–199)
HDL: 75 mg/dL (ref >39)
LDL Chol Calc (NIH): 101 mg/dL — ABNORMAL HIGH (ref 0–99)
Triglycerides: 109 mg/dL (ref 0–149)
VLDL Cholesterol Cal: 19 mg/dL (ref 5–40)

## 2024-03-03 MED ORDER — ROSUVASTATIN CALCIUM 40 MG PO TABS: 40.0000 mg | ORAL_TABLET | Freq: Every day | ORAL | 6 refills | Status: DC

## 2024-03-05 ENCOUNTER — Ambulatory Visit: Admitting: Gastroenterology

## 2024-03-21 ENCOUNTER — Encounter: Payer: Self-pay | Admitting: Pulmonary Disease

## 2024-03-29 NOTE — Progress Notes (Deleted)
 Cardiology Office Note:    Date:  03/29/2024   ID:  Susan Bray, DOB [birth year], MRN [id number]  PCP:  Susan Brady, MD  Cardiologist:  Susan Grates, MD { Click to update primary MD,subspecialty MD or APP then REFRESH:1}    Referring MD: Susan Brady, MD   Chief Complaint: follow-up of CAD, atrial fibrillation, and chronic dizziness  History of Present Illness:    Susan Bray is a 78 y.o. female with a history of moderate CAD on cardiac catheterization in 2017 (treated medically), paroxysmal atrial fibrillation on Eliquis , mild bilateral carotid stenosis, chronic dizziness, hypertension, hyperlipidemia, hypothyroidism, and IBS who is followed by Dr. Lavonne Bray and present today for routine follow-up.   Patient is primarily followed by Cardiology for CAD and paroxysmal atrial fibrillation. She has had multiple stress tests in the past. LHC in 11/2015 showed 40-60% stenosis of proximal LAD, 80% stenosis of a small to moderate sized 1st Diag, and 40% stenosis of RCA. Medical therapy was recommended at that time. Last ischemic evaluation was a Myoview  in 04/2022 which was low risk and showed no evidence of ischemia. Echo in 08/2022 showed LVEF of 60-65% with normal wall motion and diastolic parameters, normal RV size and function, and no significant valvular disease. Monitor in 08/2022 showed rare runs of SVT (longest run lasting 13 beats) but no sustained arrhythmias.   She was seen in the ED in early 09/2023 for palpitations and lightheadedness. She was noted to be in rapid atrial fibrillation with rates in the 150s. This was felt to be driven by dehydration. She was treated with fluids and heart rates improved. TSH was noted to be mildly elevated but free T4 was normal. She was started on Toprol -XL and felt to be stable for discharge. She was seen by Dr. Lavonne Bray later that month at which time she was doing better. EKG at that visit showed sinus bradycardia with rate of 51 bpm.   She was last seen  by me in 12/2023 at which time she continued to report occasional episode of palpitations which can sometimes last for 1 day but stated these were much better than before. She also also described non-cardiac chest pain as well as chronic dizziness which was not felt to be cardiac and sounded more consistent with inner ear/ vestibular issues. However, carotid dopplers were ordered and showed only mild 1-39% stenosis of bilateral ICAs.   Patient presents today for follow-up. ***  CAD Patient has had multiple ischemic evaluations in the past. LHC in 2017 showed 40-60% stenosis of proximal LAD, 80% stenosis of a small to moderate sized 1st Diag, and 40% stenosis of RCA. Medical therapy was recommended at that time. Last ischemic evaluation was a Myoview  in 04/2022 which was low risk and showed no evidence of ischemia.  - She describes very atypical chest pain that she describes as twinges that last for 1-2 seconds at a time but nothing that sounds like angina. *** - No Aspirin  given need for full anticoagulation. - Continue statin. - Symptoms do not sound cardiac in nature. No further ischemic evaluation necessary at this time. ***   Paroxysmal Atrial Fibrillation Maintaining sinus rhythm on exam. - She continues to have occasional episodes of palpitations but much improved on beta-blocker. *** - Continue Toprol -XL 12.5mg  twice daily. OK to continue to take an extra 12.5mg  as needed for worsening palpitations.  - Continue chronic anticoagulation with Eliquis  5mg  twice daily.    Dizziness *** Patient has a history of chronic  dizziness that seems to occur with quick movements such as turning her body/ head or looking up. Does not seem to be related to when she stands quickly. Not related to palpitations. Carotid dopplers showed only mild 1-39% stenosis of bilateral ICAs.  - Dizziness does not sound cardiac to me. Sounds more like inner ear/ vestibular issues. Recommend following up with PCP.    Hypertension BP *** - Continue Toprol -XL 12.5mg  twice daily.   Hyperlipidemia Lipid panel in 02/2024: Total Cholesterol 195, Triglycerides 109, HDL 75, LDL 101. LDL goal <70 given CAD. - Crestor  was recently increased to 40mg  daily at time of last check. Continue.  - Plan is to repeat lipid panel and LFTs in 1 month. ***     EKGs/Labs/Other Studies Reviewed:    The following studies were reviewed:  Left Cardiac Catheterization 12/06/2015: Ost 1st Diag to 1st Diag lesion, 80% stenosed. Prox RCA lesion, 40% stenosed. Prox LAD lesion, 60% stenosed. The lesion was not previously treated.   Heavily calcified left coronary including LAD and circumflex. Eccentric proximal 40-60% (depending upon the view) LAD. 80% small-to-moderate sized diagonal. Eccentric 40% RCA. Normal left ventricular function with ejection fraction 65%.   Recommendations: Risk factor modification to slow/avoid progression of moderate disease noted. _______________   Myoview  04/25/2022:   The study is normal. The study is low risk.   No ST deviation was noted.   LV perfusion is normal.   Left ventricular function is normal. Nuclear stress EF: 71 %. The left ventricular ejection fraction is hyperdynamic (>65%). End diastolic cavity size is normal.   Prior study available for comparison from 12/25/2012. _______________   Echocardiogram 09/10/2022: Impressions:  1. Left ventricular ejection fraction, by estimation, is 60 to 65%. The  left ventricle has normal function. The left ventricle has no regional  wall motion abnormalities. Left ventricular diastolic parameters were  normal.   2. Right ventricular systolic function is normal. The right ventricular  size is normal. There is normal pulmonary artery systolic pressure.   3. The mitral valve is normal in structure. Trivial mitral valve  regurgitation. No evidence of mitral stenosis.   4. The aortic valve is normal in structure. Aortic valve regurgitation is   not visualized. No aortic stenosis is present.   5. The inferior vena cava is normal in size with greater than 50%  respiratory variability, suggesting right atrial pressure of 3 mmHg.  _______________   Monitor 08/2022: Predominant rhythm was normal sinus Rare runs of supraventricular tachycardia with the longest run being 13 beats No sustained arrhythmias _______________  Carotid Dopplers 01/23/2024: Summary: - Right Carotid: Velocities in the right ICA are consistent with a 1-39% stenosis.  - Left Carotid: Velocities in the left ICA are consistent with a 1-39% stenosis. Non-hemodynamically significant plaque <50% noted in the CCA.  - Vertebrals: Bilateral vertebral arteries demonstrate antegrade flow. Small caliber right vertebral artery.  - Subclavians: Normal flow hemodynamics were seen in bilateral subclavian arteries.    EKG:  EKG not ordered today.   Recent Labs: 09/27/2023: BUN 19; Creatinine, Ser 1.07; Hemoglobin 12.4; Magnesium 2.1; Platelets 251; Potassium 4.0; Sodium 138; TSH 5.625 03/02/2024: ALT 11  Recent Lipid Panel    Component Value Date/Time   CHOL 195 03/02/2024 0822   TRIG 109 03/02/2024 0822   HDL 75 03/02/2024 0822   CHOLHDL 2.6 03/02/2024 0822   CHOLHDL 3.2 11/15/2015 0901   VLDL 23 11/15/2015 0901   LDLCALC 101 (H) 03/02/2024 0822    Physical Exam:  Vital Signs: There were no vitals taken for this visit.    Wt Readings from Last 3 Encounters:  02/13/24 132 lb 3.2 oz (60 kg)  01/09/24 130 lb 6.4 oz (59.1 kg)  10/02/23 126 lb 9.6 oz (57.4 kg)     General: 78 y.o. female in no acute distress. HEENT: Normocephalic and atraumatic. Sclera clear.  Neck: Supple. No carotid bruits. No JVD. Heart: *** RRR. Distinct S1 and S2. No murmurs, gallops, or rubs.  Lungs: No increased work of breathing. Clear to ausculation bilaterally. No wheezes, rhonchi, or rales.  Abdomen: Soft, non-distended, and non-tender to palpation.  Extremities: No lower extremity  edema.  Radial and distal pedal pulses 2+ and equal bilaterally. Skin: Warm and dry. Neuro: No focal deficits. Psych: Normal affect. Responds appropriately.   Assessment:    No diagnosis found.  Plan:     Disposition: Follow up in ***   Signed, Inice Sanluis E Alvah Lagrow, PA-C  03/29/2024 11:47 PM    [facility]

## 2024-04-02 ENCOUNTER — Ambulatory Visit: Admitting: Student

## 2024-04-03 MED ORDER — PANTOPRAZOLE SODIUM 40 MG PO TBEC: 40.0000 mg | DELAYED_RELEASE_TABLET | Freq: Every day | ORAL | 3 refills | Status: AC

## 2024-04-13 ENCOUNTER — Other Ambulatory Visit: Payer: Self-pay

## 2024-04-13 MED ORDER — APIXABAN 5 MG PO TABS: 5.0000 mg | ORAL_TABLET | Freq: Two times a day (BID) | ORAL | 1 refills | Status: DC

## 2024-04-13 NOTE — Telephone Encounter (Signed)
 Prescription refill request for Eliquis  received. Indication:afib Last office visit:3/25 Scr:1.07  12/24 Age: 78 Weight:60  kg  Prescription refilled

## 2024-05-09 LAB — LIPID PANEL
Chol/HDL Ratio: 2.8 ratio (ref 0.0–4.4)
Cholesterol, Total: 199 mg/dL (ref 100–199)
HDL: 71 mg/dL (ref >39)
LDL Chol Calc (NIH): 107 mg/dL — ABNORMAL HIGH (ref 0–99)
Triglycerides: 119 mg/dL (ref 0–149)
VLDL Cholesterol Cal: 21 mg/dL (ref 5–40)

## 2024-05-09 LAB — HEPATIC FUNCTION PANEL
ALT: 11 IU/L (ref 0–32)
AST: 18 IU/L (ref 0–40)
Albumin: 4.1 g/dL (ref 3.8–4.8)
Alkaline Phosphatase: 49 IU/L (ref 44–121)
Bilirubin Total: 0.6 mg/dL (ref 0.0–1.2)
Bilirubin, Direct: 0.19 mg/dL (ref 0.00–0.40)
Total Protein: 6.4 g/dL (ref 6.0–8.5)

## 2024-05-11 MED ORDER — EZETIMIBE 10 MG PO TABS: 10.0000 mg | ORAL_TABLET | Freq: Every day | ORAL | 3 refills | Status: DC

## 2024-05-11 NOTE — Addendum Note (Signed)
 Addended by: DENTON BENDERS E on: 05/11/2024 01:53 PM   Modules accepted: Orders

## 2024-05-12 NOTE — Telephone Encounter (Signed)
 Zetia  is not contraindicated in patient with interstitial lung disease so it would be okay for her take if she is agreeable. It looks like she has taken Zetia  before. However, if she does not feel comfortable with this, we could do Nexletol 180mg  daily instead. I still would like to repeat lipid panel and LFTs in 3 months if we do that.   Thank you!

## 2024-05-14 NOTE — Telephone Encounter (Signed)
 Okay, thanks for the heads up.  Best, Terrionna Bridwell

## 2024-05-15 ENCOUNTER — Encounter: Payer: Self-pay | Admitting: Pulmonary Disease

## 2024-05-18 NOTE — Telephone Encounter (Signed)
 Please schedule ct scan prior to 9/29 appointment preferably a week before around the 9/20

## 2024-06-01 NOTE — Telephone Encounter (Signed)
 Left Detailed message about appt. Theatre manager.

## 2024-06-18 ENCOUNTER — Ambulatory Visit
Admission: RE | Admit: 2024-06-18 | Discharge: 2024-06-18 | Disposition: A | Discharge status: Home / Self Care | Source: Ambulatory Visit | Attending: Pulmonary Disease | Admitting: Pulmonary Disease

## 2024-06-18 DIAGNOSIS — J849 Interstitial pulmonary disease, unspecified: Secondary | ICD-10-CM

## 2024-06-18 NOTE — Progress Notes (Unsigned)
 Cardiology Office Note:   Date:  06/19/2024  ID:  Susan Bray, DOB [birth year], MRN [id number] PCP: Leonel Cole, MD  [facility] Providers Cardiologist:  Lynwood Schilling, MD {  History of Present Illness:   Susan Bray is a 78 y.o. female who presents for evaluation of dizziness, lightheadedness, arm discomfort.   I saw her in 2017.    She was found to have non obstructive and branch vessel disease and was managed medically.   I saw her in June 2024 and she had some increased shortness of breath.  Perfusion imaging demonstrated no evidence of ischemia and was thought to be low risk.  I saw her recently at pulmonary rehab.  She was noted to have atrial fib.    She was in the emergency room in late December 2024 with dizziness and palpitation and had atrial fibrillation with a rapid rate.  She was treated with fluids.  She was started on Toprol -XL.  I saw her after that and she had a little bit of sinus bradycardia.  She was followed in our clinic in the spring 2025 and had some dizziness.  The etiology was thought to be possibly in her ear but she had carotid Dopplers which were unremarkable.  She returns for follow-up.   She has many questions today.  She does occasionally have atrial fibrillation.  She does not think she is as bad as it was previously but it still might last a day.  She shows me some heart rates in the 1 teens when it is happening.  She does not have presyncope or syncope.  She does not have chest pressure, neck or arm discomfort.  She has had no weight gain or edema.  She has a little bit of a chronic cough and is going to see the pulmonologist for management of her interstitial lung disease.  She is seeing a nephrologist soon because she has some protein in her urine and her creatinine was up mildly.   ROS: As stated in the HPI and negative for all other systems.   Studies Reviewed:    EKG:     Sinus rhythm, rate 51, axis within normal limits, intervals within normal  limits, no acute ST-T wave changes, 10/02/2023  Risk Assessment/Calculations:    CHA2DS2-VASc Score = 4   This indicates a 4.8% annual risk of stroke. The patient's score is based upon: CHF History: 0 HTN History: 1 Diabetes History: 0 Stroke History: 0 Vascular Disease History: 0 Age Score: 2 Gender Score: 1  Physical Exam:   VS:  BP 137/62   Pulse 63   Ht 5' 3 (1.6 m)   Wt 135 lb (61.2 kg)   SpO2 98%   BMI 23.91 kg/m    Wt Readings from Last 3 Encounters:  06/19/24 135 lb (61.2 kg)  02/13/24 132 lb 3.2 oz (60 kg)  01/09/24 130 lb 6.4 oz (59.1 kg)     GEN: Well nourished, well developed in no acute distress NECK: No JVD; No carotid bruits CARDIAC: RRR, no murmurs, rubs, gallops RESPIRATORY:  Clear to auscultation without rales, wheezing or rhonchi  ABDOMEN: Soft, non-tender, non-distended EXTREMITIES:  No edema; No deformity   ASSESSMENT AND PLAN:       AF:   She has infrequent paroxysms and she does not want to think about having any procedures or other therapies for this.  She tolerates anticoagulation.  No further cardiovascular testing is indicated.  We will continue with aggressive risk  reduction and meds as listed.   CAD:  The patient has no new sypmtoms.  No further cardiovascular testing is indicated.  We will continue with aggressive risk reduction and meds as listed.  DYSLIPIDEMIA:   LDL was 107 down from 146.  Her HDL is excellent.  She is tolerating the Crestor .  She did not start Zetia .   HTN: Her blood pressure is at target.  No change in therapy.    Follow up with me in 12 months.   Signed, Lynwood Schilling, MD

## 2024-06-19 ENCOUNTER — Encounter: Payer: Self-pay | Admitting: Cardiology

## 2024-06-19 ENCOUNTER — Ambulatory Visit: Attending: Cardiology | Admitting: Cardiology

## 2024-06-19 VITALS — BP 137/62 | HR 63 | Ht 63.0 in | Wt 135.0 lb

## 2024-06-19 DIAGNOSIS — E785 Hyperlipidemia, unspecified: Secondary | ICD-10-CM

## 2024-06-19 DIAGNOSIS — I2510 Atherosclerotic heart disease of native coronary artery without angina pectoris: Secondary | ICD-10-CM

## 2024-06-19 DIAGNOSIS — I480 Paroxysmal atrial fibrillation: Secondary | ICD-10-CM | POA: Diagnosis not present

## 2024-06-19 DIAGNOSIS — I10 Essential (primary) hypertension: Secondary | ICD-10-CM | POA: Diagnosis not present

## 2024-06-19 NOTE — Patient Instructions (Signed)
 Medication Instructions:  Stop Zetia  *If you need a refill on your cardiac medications before your next appointment, please call your pharmacy*  Lab Work: NONE If you have labs (blood work) drawn today and your tests are completely normal, you will receive your results only by: MyChart Message (if you have MyChart) OR A paper copy in the mail If you have any lab test that is abnormal or we need to change your treatment, we will call you to review the results.  Testing/Procedures: NONE  Follow-Up: At [facility], you and your health needs are our priority.  As part of our continuing mission to provide you with exceptional heart care, our providers are all part of one team.  This team includes your primary Cardiologist (physician) and Advanced Practice Providers or APPs (Physician Assistants and Nurse Practitioners) who all work together to provide you with the care you need, when you need it.  Your next appointment:   1 year(s)  Provider:   Lynwood Schilling, MD    We recommend signing up for the patient portal called MyChart.  Sign up information is provided on this After Visit Summary.  MyChart is used to connect with patients for Virtual Visits (Telemedicine).  Patients are able to view lab/test results, encounter notes, upcoming appointments, etc.  Non-urgent messages can be sent to your provider as well.   To learn more about what you can do with MyChart, go to [email or web address].   Other Instructions [facility]

## 2024-06-25 ENCOUNTER — Other Ambulatory Visit: Payer: Self-pay | Admitting: Nephrology

## 2024-06-25 ENCOUNTER — Encounter: Payer: Self-pay | Admitting: Cardiology

## 2024-06-25 ENCOUNTER — Ambulatory Visit: Payer: Self-pay | Admitting: Pulmonary Disease

## 2024-06-25 DIAGNOSIS — N1832 Chronic kidney disease, stage 3b: Secondary | ICD-10-CM

## 2024-06-26 ENCOUNTER — Ambulatory Visit
Admission: RE | Admit: 2024-06-26 | Discharge: 2024-06-26 | Disposition: A | Discharge status: Home / Self Care | Source: Ambulatory Visit | Attending: Nephrology | Admitting: Nephrology

## 2024-06-26 DIAGNOSIS — N1832 Chronic kidney disease, stage 3b: Secondary | ICD-10-CM

## 2024-06-29 ENCOUNTER — Other Ambulatory Visit: Payer: Self-pay | Admitting: Family Medicine

## 2024-06-29 DIAGNOSIS — Z1231 Encounter for screening mammogram for malignant neoplasm of breast: Secondary | ICD-10-CM

## 2024-07-09 ENCOUNTER — Ambulatory Visit
Admission: RE | Admit: 2024-07-09 | Discharge: 2024-07-09 | Disposition: A | Discharge status: Home / Self Care | Source: Ambulatory Visit

## 2024-07-09 DIAGNOSIS — Z1231 Encounter for screening mammogram for malignant neoplasm of breast: Secondary | ICD-10-CM

## 2024-07-10 ENCOUNTER — Encounter: Payer: Self-pay | Admitting: Cardiology

## 2024-07-10 LAB — HEPATIC FUNCTION PANEL
ALT: 12 IU/L (ref 0–32)
AST: 15 IU/L (ref 0–40)
Albumin: 4.1 g/dL (ref 3.8–4.8)
Alkaline Phosphatase: 61 IU/L (ref 49–135)
Bilirubin Total: 0.3 mg/dL (ref 0.0–1.2)
Bilirubin, Direct: 0.12 mg/dL (ref 0.00–0.40)
Total Protein: 6.5 g/dL (ref 6.0–8.5)

## 2024-07-10 LAB — LIPID PANEL
Chol/HDL Ratio: 3.1 ratio (ref 0.0–4.4)
Cholesterol, Total: 172 mg/dL (ref 100–199)
HDL: 56 mg/dL (ref >39)
LDL Chol Calc (NIH): 81 mg/dL (ref 0–99)
Triglycerides: 213 mg/dL — ABNORMAL HIGH (ref 0–149)
VLDL Cholesterol Cal: 35 mg/dL (ref 5–40)

## 2024-07-20 ENCOUNTER — Encounter: Payer: Self-pay | Admitting: Pulmonary Disease

## 2024-07-20 ENCOUNTER — Ambulatory Visit: Admitting: Pulmonary Disease

## 2024-07-20 VITALS — BP 124/64 | HR 62 | Temp 98.8°F | Ht 63.0 in | Wt 137.0 lb

## 2024-07-20 DIAGNOSIS — R600 Localized edema: Secondary | ICD-10-CM

## 2024-07-20 DIAGNOSIS — Z5181 Encounter for therapeutic drug level monitoring: Secondary | ICD-10-CM

## 2024-07-20 DIAGNOSIS — M3500 Sicca syndrome, unspecified: Secondary | ICD-10-CM | POA: Diagnosis not present

## 2024-07-20 DIAGNOSIS — J849 Interstitial pulmonary disease, unspecified: Secondary | ICD-10-CM | POA: Diagnosis not present

## 2024-07-20 DIAGNOSIS — I4891 Unspecified atrial fibrillation: Secondary | ICD-10-CM

## 2024-07-20 NOTE — Patient Instructions (Signed)
  VISIT SUMMARY: Today, we reviewed your interstitial lung disease, new cough, and ankle swelling. We also discussed your history of atrial fibrillation and upcoming cardiology appointment.  YOUR PLAN: INTERSTITIAL LUNG DISEASE: You have interstitial lung disease related to Sjogren's syndrome and dermatomyositis. Your recent CT scan shows mild scarring in the lungs, which is stable. -Continue taking CellCept 500 mg twice daily. -Continue tapering off prednisone as directed by your rheumatologist. -You can use Mucinex for your cough as needed.  LOWER EXTREMITY EDEMA: You have new swelling in your ankles, which may be related to pulmonary hypertension. -We will order an echocardiogram to evaluate for pulmonary hypertension and assess your heart function.  ATRIAL FIBRILLATION: You have a history of atrial fibrillation and are experiencing episodes of low oxygen saturation and low heart rate, which may be related to your condition or medication. -You have an upcoming appointment with your cardiologist, Dr. Lynwood Alm, in December.

## 2024-07-20 NOTE — Progress Notes (Signed)
 Susan Bray    [id number]    09-Feb-1946  Primary Care Physician:Hammer, Cheryle, MD  Referring Physician: Leonel Cheryle, MD [address] [place],  [place] [address]  Chief complaint: Follow-up for interstitial lung disease  HPI: 78 y.o. who  has a past medical history of Atrial fibrillation (HCC), CAD (coronary artery disease), Cataract, Diverticulitis, Hyperlipidemia, IBS (irritable bowel syndrome), Thyroid  disease, and VIN III (vulvar intraepithelial neoplasia III).   She has Sjogren's syndrome and dermatomyositis with initial complaints of skin thickening, joint stiffness, dyspnea and cough.  Diagnosis made in June 2023 by Dr. Curt.  Initially tried on prednisone and methotrexate.  Methotrexate was stopped for unclear reason.  Patient  Imaging shows interstitial lung disease and she has been referred here for further evaluation She was started on azathioprine  50 mg/day in July 2023 and this was increased to 100 mg/day in August 2023 and to 150 mg/day on October 2023. Azathioprine  has been denied by insurance and spite of multiple appeals.  She is paying out-of-pocket with good Rx card, costs $30 a month.  Her symptoms have improved with the treatment Continues on prednisone per Dr. Curt Martes pulmonary rehab She had an episode of paroxysmal atrial fibrillation in November 2023 while in pulmonary rehab.  Has followed up with Dr. Lavona from cardiology.  Started on Eliquis  and Cardizem .  Had a second opinion visit at ILD clinic at [facility] in January 2025 and saw Dr. Tobie and rheumatology who agreed with the diagnosis.  Repeat myositis panel showed persistent elevation of NXP 2 antibody  Interim history: Discussed the use of AI scribe software for clinical note transcription with the patient, who gave verbal consent to proceed.  History of Present Illness Susan Bray is a 78 year old female with interstitial lung disease secondary to Sjogren's and  dermatomyositis who presents for follow-up of her lung condition.  Interstitial lung disease and pulmonary symptoms - Interstitial lung disease secondary to Sjogren's syndrome and dermatomyositis - Recent CT scan demonstrates mild scarring in the lungs, unchanged from previous imaging - Episodes of oxygen desaturation into the 70s, associated with concurrent bradycardia (heart rate in the 40s-50s), resolving with breathing exercises - Currently on CellCept 500 mg twice daily and tapering prednisone, now at 3 mg daily  Cough and upper respiratory symptoms - New onset cough - Symptom relief with Mucinex - Uncertain if cough is due to allergies or mucus  Peripheral edema - New ankle swelling over the past month - Concern for possible pulmonary hypertension related to interstitial lung disease  Cardiac history and symptoms - History of atrial fibrillation - On metoprolol  therapy - Upcoming cardiology appointment scheduled for December   Relevant pulmonary history Pets: No pets 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  No feather pillows or comforters Smoking history: Never smoker Travel history: Significant travel history Relevant family history: No family history of lung disease  Outpatient Encounter Medications as of 07/20/2024  Medication Sig   apixaban  (ELIQUIS ) 5 MG TABS tablet Take 1 tablet (5 mg total) by mouth 2 (two) times daily.   dicyclomine  (BENTYL ) 20 MG tablet Take 1 tablet (20 mg total) by mouth every 6 (six) hours as needed for spasms.   levothyroxine (SYNTHROID) 75 MCG tablet Take 75 mcg by mouth daily before breakfast.   metoprolol  succinate (TOPROL -XL) 25 MG 24 hr tablet Take 0.5 tablets (12.5 mg total) by mouth 2 (two) times daily. Take  your metoprolol  12.5 mg twice daily and take an extra 1/2 tablet (12.5mg ) FOR WORSENING PALPITATIONS.   mycophenolate (CELLCEPT) 500 MG tablet Take 500 mg by mouth 2 (two) times daily.   pantoprazole   (PROTONIX ) 40 MG tablet Take 1 tablet (40 mg total) by mouth daily.   predniSONE (DELTASONE) 5 MG tablet Take 10 mg by mouth daily. (Patient taking differently: Take 10 mg by mouth daily. Taking 3mg )   rosuvastatin  (CRESTOR ) 40 MG tablet Take 1 tablet (40 mg total) by mouth daily.   SYNTHROID 50 MCG tablet Take 50 mcg by mouth daily before breakfast.   fluticasone furoate-vilanterol (BREO ELLIPTA) 100-25 MCG/ACT AEPB Inhale 1 puff into the lungs. (Patient not taking: Reported on 02/13/2024)   No facility-administered encounter medications on file as of 07/20/2024.   Vitals:   07/20/24 0839  BP: 124/64  Pulse: 62  Temp: 98.8 F (37.1 C)  Height: 5' 3 (1.6 m)  Weight: 137 lb (62.1 kg)  SpO2: 97%  TempSrc: Oral  BMI (Calculated): 24.27    Physical Exam GEN: No acute distress CV: Regular rate and rhythm no murmurs LUNGS: Clear to auscultation bilaterally normal respiratory effort SKIN JOINTS: Warm and dry no rash    Data Reviewed: Imaging: Cardiac CT 10/19/2015-visualized lung shows fibrotic changes with groundglass and traction bronchiectasis.  High-resolution CT 05/31/2022-basilar predominant fibrotic disease with groundglass opacities, traction bronchiectasis.  Alternate diagnosis suggestive of fibrotic NSIP  High resolution CT 01/21/2023-organized pattern of interstitial lung disease, improvement in inflammatory changes.  High resolution CT 06/18/2024-no significant changes and mild pulmonary fibrosis, alternate pattern.  Mild air trapping.  Coronary artery disease I have reviewed the images personally  PFTs: 06/10/2022 FVC 2.17 [79%], FEV1 1.94 [94%], F/F 89, TLC 3.04 [61%], DLCO 16.88 [91%] Moderate restriction  01/28/2023 FVC 2.22 [82%], FEV1 1.85 [91%], F/F83, TLC 3.71 [75%], DLCO 15.12 [81%] Mild restriction  11/07/2022 at [facility] FVC 2.40 [97%], FEV1 2.05 [107%], F/F 86, TLC 4.42 [95%] DLCO 14.3 [80%]  Labs: ILD panel 05/22/2022- ANA 1: 320, positive SSA, NX P2  antibody C-ANCA 05/22/2022- positive  CBC 01/15/2023- WBC 7.8, hemoglobin 11.4, platelets 411 CMP 01/15/2023-alk phos 34, AST 22, ALT 20 Assessment & Plan Interstitial lung disease associated with Sjogren's syndrome and dermatomyositis CT scan shows mild scarring in the lungs, unchanged from previous imaging. The interstitial lung disease is likely associated with Sjogren's syndrome and dermatomyositis. Current treatment with CellCept (mycophenolate mofetil) 500 mg twice daily is appropriate, and she is being tapered off prednisone. The decision to maintain the current dose of CellCept is supported by the stable CT findings. Mucinex is safe for her cough, which may be due to allergies or mucus. - Continue CellCept 500 mg twice daily. - Continue tapering off prednisone as directed by rheumatologist. - Allow use of Mucinex for cough as needed.  Lower extremity edema, evaluation for possible pulmonary hypertension Reports new onset of lower extremity edema over the past month. Concern for pulmonary hypertension, which could be secondary to interstitial lung disease and cause strain on the right side of the heart. - Order echocardiogram to evaluate for pulmonary hypertension and assess heart function.  Atrial fibrillation Reports episodes of low oxygen saturation and low heart rate, which may be related to her atrial fibrillation or the medication she is taking for it. Currently under the care of Dr. Edison for her heart condition. The low heart rate could be a side effect of her medication, such as metoprolol , and she experiences dizziness and lightheadedness, complicating the  assessment.   Health maintenance I have advised her to get flu, COVID and RSV vaccination but she has declined these vaccinations.  Plan/Recommendations: Continue CellCept, prednisone. Managed by rheumatology High-res CT in 6 months  Lonna Coder MD [facility] 07/20/2024, 9:04 AM  CC: Leonel Cole, MD

## 2024-07-24 ENCOUNTER — Ambulatory Visit (HOSPITAL_COMMUNITY)
Admission: RE | Admit: 2024-07-24 | Discharge: 2024-07-24 | Disposition: A | Discharge status: Home / Self Care | Source: Ambulatory Visit | Attending: Pulmonary Disease | Admitting: Pulmonary Disease

## 2024-07-24 DIAGNOSIS — J849 Interstitial pulmonary disease, unspecified: Secondary | ICD-10-CM | POA: Insufficient documentation

## 2024-07-24 DIAGNOSIS — R0600 Dyspnea, unspecified: Secondary | ICD-10-CM | POA: Diagnosis present

## 2024-07-24 LAB — ECHOCARDIOGRAM COMPLETE
Area-P 1/2: 4.49 cm2
S' Lateral: 2 cm

## 2024-08-05 ENCOUNTER — Ambulatory Visit: Payer: Self-pay | Admitting: Pulmonary Disease

## 2024-08-12 ENCOUNTER — Encounter: Payer: Self-pay | Admitting: Cardiology

## 2024-08-12 DIAGNOSIS — I480 Paroxysmal atrial fibrillation: Secondary | ICD-10-CM

## 2024-10-31 ENCOUNTER — Other Ambulatory Visit: Payer: Self-pay | Admitting: Student

## 2024-11-02 MED ORDER — ROSUVASTATIN CALCIUM 40 MG PO TABS: 40.0000 mg | ORAL_TABLET | Freq: Every day | ORAL | 2 refills | Status: AC

## 2024-11-11 ENCOUNTER — Other Ambulatory Visit: Payer: Self-pay | Admitting: Cardiology

## 2024-11-11 DIAGNOSIS — I480 Paroxysmal atrial fibrillation: Secondary | ICD-10-CM

## 2024-11-11 NOTE — Telephone Encounter (Signed)
 Eliquis  5mg  refill request received. Patient is 79 years old, weight-62.1kg, Crea-1.23 on 09/23/24 via [employer] tab from [employer],  Diagnosis-Afib, and last seen by Dr. Lavona on 06/19/24. Dose is appropriate based on dosing criteria. Will send in refill to requested pharmacy.

## 2025-02-03 ENCOUNTER — Encounter

## 2025-02-03 ENCOUNTER — Ambulatory Visit: Admitting: Pulmonary Disease
